# Patient Record
Sex: Male | Born: 1935 | Marital: Married | Smoking: Current some day smoker
Health system: Southern US, Community
[De-identification: ages and names within clinical notes are randomized; demographics above are authoritative.]

## PROBLEM LIST (undated history)

## (undated) DIAGNOSIS — I4891 Unspecified atrial fibrillation: Secondary | ICD-10-CM

## (undated) DIAGNOSIS — I10 Essential (primary) hypertension: Secondary | ICD-10-CM

## (undated) DIAGNOSIS — E538 Deficiency of other specified B group vitamins: Secondary | ICD-10-CM

---

## 2006-11-07 ENCOUNTER — Emergency Department: Payer: Self-pay | Admitting: Emergency Medicine

## 2012-08-04 ENCOUNTER — Ambulatory Visit: Payer: Self-pay | Admitting: Ophthalmology

## 2012-08-04 DIAGNOSIS — I10 Essential (primary) hypertension: Secondary | ICD-10-CM

## 2012-08-04 LAB — POTASSIUM: Potassium: 3.4 mmol/L — ABNORMAL LOW (ref 3.5–5.1)

## 2012-08-16 ENCOUNTER — Ambulatory Visit: Payer: Self-pay | Admitting: Ophthalmology

## 2014-08-18 NOTE — Op Note (Signed)
PATIENT NAME:  Jim Blake, [name] MR#:  [id number] DATE OF BIRTH:  Oct 12, 1935  DATE OF PROCEDURE:  08/16/2012  PREOPERATIVE DIAGNOSIS: Cataract, right eye.  POSTOPERATIVE DIAGNOSIS: Cataract, right eye.  PROCEDURE PERFORMED: Extracapsular cataract extraction using phacoemulsification with placement of Alcon [id number], 19.5 diopter posterior chamber lens, serial number [id number].   SURGEON: Maylon PeppersSteven A. Karelyn Brisby, MD  ANESTHESIA: 4% lidocaine and 0.75% Marcaine in a 50/50 mixture with 10 units per mL of Hylenex added, given as a peribulbar.   ANESTHESIOLOGIST: Dr. Dimple Caseyice.   COMPLICATIONS: None.   ESTIMATED BLOOD LOSS: Less than 1 mL.   DESCRIPTION OF PROCEDURE:  The patient was brought to the operating room and given a peribulbar block.  The patient was then prepped and draped in the usual fashion.  The vertical rectus muscles were imbricated using 5-0 silk sutures.  These sutures were then clamped to the sterile drapes as bridle sutures.  A limbal peritomy was performed extending two clock hours and hemostasis was obtained with cautery.  A partial thickness scleral groove was made at the surgical limbus and dissected anteriorly in a lamellar dissection using an Alcon crescent knife.  The anterior chamber was entered superonasally with a Superblade and through the lamellar dissection with a 2.6 mm keratome.  DisCoVisc was used to replace the aqueous and a continuous tear capsulorrhexis was carried out.  Hydrodissection and hydrodelineation were carried out with balanced salt and a 27 gauge canula.  The nucleus was rotated to confirm the effectiveness of the hydrodissection.  Phacoemulsification was carried out using a divide-and-conquer technique.  Total ultrasound time was 1 minute and 12 seconds with an average power of 22.5 percent.  CDE of 32.95.  Irrigation/aspiration was used to remove the residual cortex.  DisCoVisc was used to inflate the capsule and the internal incision was enlarged to 3 mm with  the crescent knife.  The intraocular lens was folded and inserted into the capsular bag using the AcrySert delivery system.  Irrigation/aspiration was used to remove the residual DisCoVisc.  Miostat was injected into the anterior chamber through the paracentesis track to inflate the anterior chamber and induce miosis.  10 mL of cefuroxime was placed through the paracentesis tract.  The wound was checked for leaks and none were found. The conjunctiva was closed with cautery and the bridle sutures were removed.  Two drops of 0.3% Vigamox were placed on the eye.   An eye shield was placed on the eye.  The patient was discharged to the recovery room in good condition.     ____________________________ Maylon PeppersSteven A. Shuayb Schepers, MD sad:es D: 08/16/2012 14:26:36 ET T: 08/16/2012 15:39:18 ET JOB#: [id number]  cc: Viviann SpareSteven A. Geraldy Akridge, MD, <Dictator> Erline LevineSTEVEN A Jan Walters MD ELECTRONICALLY SIGNED 08/23/2012 14:21

## 2016-03-24 DIAGNOSIS — M436 Torticollis: Secondary | ICD-10-CM | POA: Insufficient documentation

## 2019-08-12 DIAGNOSIS — N401 Enlarged prostate with lower urinary tract symptoms: Secondary | ICD-10-CM | POA: Insufficient documentation

## 2019-08-12 DIAGNOSIS — N138 Other obstructive and reflux uropathy: Secondary | ICD-10-CM | POA: Insufficient documentation

## 2020-10-06 ENCOUNTER — Other Ambulatory Visit: Payer: Self-pay

## 2020-10-06 ENCOUNTER — Emergency Department
Admission: EM | Admit: 2020-10-06 | Discharge: 2020-10-06 | Disposition: A | Discharge status: Home / Self Care | Payer: Medicare Other | Attending: Emergency Medicine | Admitting: Emergency Medicine

## 2020-10-06 ENCOUNTER — Encounter: Payer: Self-pay | Admitting: Emergency Medicine

## 2020-10-06 ENCOUNTER — Emergency Department: Payer: Medicare Other

## 2020-10-06 DIAGNOSIS — F17290 Nicotine dependence, other tobacco product, uncomplicated: Secondary | ICD-10-CM | POA: Diagnosis not present

## 2020-10-06 DIAGNOSIS — N3001 Acute cystitis with hematuria: Secondary | ICD-10-CM | POA: Insufficient documentation

## 2020-10-06 DIAGNOSIS — S0591XA Unspecified injury of right eye and orbit, initial encounter: Secondary | ICD-10-CM | POA: Diagnosis present

## 2020-10-06 DIAGNOSIS — F0390 Unspecified dementia without behavioral disturbance: Secondary | ICD-10-CM | POA: Insufficient documentation

## 2020-10-06 DIAGNOSIS — Z7901 Long term (current) use of anticoagulants: Secondary | ICD-10-CM | POA: Diagnosis not present

## 2020-10-06 DIAGNOSIS — W19XXXA Unspecified fall, initial encounter: Secondary | ICD-10-CM | POA: Diagnosis not present

## 2020-10-06 DIAGNOSIS — I4891 Unspecified atrial fibrillation: Secondary | ICD-10-CM | POA: Diagnosis not present

## 2020-10-06 DIAGNOSIS — S0511XA Contusion of eyeball and orbital tissues, right eye, initial encounter: Secondary | ICD-10-CM | POA: Diagnosis not present

## 2020-10-06 DIAGNOSIS — I10 Essential (primary) hypertension: Secondary | ICD-10-CM | POA: Insufficient documentation

## 2020-10-06 HISTORY — DX: Essential (primary) hypertension: I10

## 2020-10-06 LAB — BASIC METABOLIC PANEL
Anion gap: 8 (ref 5–15)
BUN: 30 mg/dL — ABNORMAL HIGH (ref 8–23)
CO2: 30 mmol/L (ref 22–32)
Calcium: 9.4 mg/dL (ref 8.9–10.3)
Chloride: 99 mmol/L (ref 98–111)
Creatinine, Ser: 1.44 mg/dL — ABNORMAL HIGH (ref 0.61–1.24)
GFR, Estimated: 48 mL/min — ABNORMAL LOW (ref >60)
Glucose, Bld: 142 mg/dL — ABNORMAL HIGH (ref 70–99)
Potassium: 3.1 mmol/L — ABNORMAL LOW (ref 3.5–5.1)
Sodium: 137 mmol/L (ref 135–145)

## 2020-10-06 LAB — URINALYSIS, COMPLETE (UACMP) WITH MICROSCOPIC
Bilirubin Urine: NEGATIVE
Glucose, UA: NEGATIVE mg/dL
Ketones, ur: NEGATIVE mg/dL
Nitrite: NEGATIVE
Protein, ur: 30 mg/dL — AB
Specific Gravity, Urine: 1.019 (ref 1.005–1.030)
Squamous Epithelial / HPF: NONE SEEN (ref 0–5)
WBC, UA: >50 WBC/hpf — ABNORMAL HIGH (ref 0–5)
pH: 5 (ref 5.0–8.0)

## 2020-10-06 LAB — CBC
HCT: 38.3 % — ABNORMAL LOW (ref 39.0–52.0)
Hemoglobin: 12.5 g/dL — ABNORMAL LOW (ref 13.0–17.0)
MCH: 25.8 pg — ABNORMAL LOW (ref 26.0–34.0)
MCHC: 32.6 g/dL (ref 30.0–36.0)
MCV: 79.1 fL — ABNORMAL LOW (ref 80.0–100.0)
Platelets: 210 10*3/uL (ref 150–400)
RBC: 4.84 MIL/uL (ref 4.22–5.81)
RDW: 15.5 % (ref 11.5–15.5)
WBC: 11.1 10*3/uL — ABNORMAL HIGH (ref 4.0–10.5)
nRBC: 0 % (ref 0.0–0.2)

## 2020-10-06 MED ORDER — CEPHALEXIN 500 MG PO CAPS: 500.0000 mg | ORAL_CAPSULE | Freq: Two times a day (BID) | ORAL | 0 refills | Status: AC

## 2020-10-06 NOTE — ED Notes (Signed)
Wife reports patient sleeps in a different bedroom. Wife reports noticing blood on the patient's head this morning, and patient reported that he fell sometime last night getting up to the bathroom. The wife reports tremors are baseline for patient. Wife reports some confusion this morning, worse than patient's baseline. Patient is alert, and follows commands, but does appear confused. Patient reports he thinks he is in a [employer]. Abrasion noted to forehead. Patient also noted to be febrile on arrival to room. Provider notified.

## 2020-10-06 NOTE — ED Notes (Signed)
Pt standing up at bedside attempting to provide urine sample. Visitor remains with pt. Will complete I&O cath if pt unable to provide sample.

## 2020-10-06 NOTE — Discharge Instructions (Signed)
You do have a urinary tract infection. Please take the antibiotic as prescribed.   See primary care in 2 weeks to make sure it is gone.

## 2020-10-06 NOTE — ED Notes (Signed)
Went to pt's room to prompt him to provide urine sample or may need to complete I&O cath. However, found pt in restroom and was unable to get him urinal quick enough to collect sample. Pt given urinal and told to provide sample next time he feels able. Pt back in stretcher.

## 2020-10-06 NOTE — ED Triage Notes (Signed)
Pt via POV from home. Pt wife states that she thinks pt fell during the night. Pt has a hx of dementia. Per wife, she came in room and saw an abrasion the L eyelid. Unknown when he fell, but had to be after 10:00pm last night. Unknow why he fell. Denies pain. Pt is alert to self.

## 2020-10-06 NOTE — ED Notes (Signed)
Patient able to provide urine sample without cath. Cari B, PA aware. Urine sent to lab.

## 2020-10-06 NOTE — ED Notes (Signed)
Pt up to restroom again attempting to provide urine sample. Unable to do so.

## 2020-10-06 NOTE — ED Provider Notes (Signed)
[facility] Emergency Department Provider Note ____________________________________________   Event Date/Time   First MD Initiated Contact with Patient 10/06/20 1335     (approximate)  I have reviewed the triage vital signs and the nursing notes.   HISTORY  Chief Complaint Fall  HPI Jim Blake is a 85 y.o. male with history of dementia, a-fib on Eliquis, hypertension presents to the emergency department for treatment and evaluation after unwitnessed fall sometime last night. Wife states that she got up this morning and headed down the hallway to wake him up to go for a haircut, but noticed his bathroom fan and light were on. When she went into his bedroom, he was in bed but she noticed a bruise and abrasion over the right eye. He is confused at baseline and does not recall the incident. He denies pain. Wife denies recent illness.          Past Medical History:  Diagnosis Date   Hypertension     There are no problems to display for this patient.   History reviewed. No pertinent surgical history.  Prior to Admission medications   Medication Sig Start Date End Date Taking? Authorizing Provider  cephALEXin (KEFLEX) 500 MG capsule Take 1 capsule (500 mg total) by mouth 2 (two) times daily for 7 days. 10/06/20 10/13/20 Yes Ivyanna Sibert, Kasandra Knudsen, FNP    Allergies Patient has no known allergies.  History reviewed. No pertinent family history.  Social History Social History   Tobacco Use   Smoking status: Some Days    Pack years: 0.00    Types: Cigars   Smokeless tobacco: Never  Substance Use Topics   Alcohol use: Never   Drug use: Never    Review of Systems  Level 5 Caveat: Confusion/dementia ____________________________________________   PHYSICAL EXAM:  VITAL SIGNS: ED Triage Vitals  Enc Vitals Group     BP 10/06/20 1128 (!) 114/57     Pulse Rate 10/06/20 1128 67     Resp 10/06/20 1128 18     Temp 10/06/20 1128 100.1 F (37.8 C)      Temp Source 10/06/20 1128 Oral     SpO2 10/06/20 1128 97 %     Weight 10/06/20 1128 130 lb (59 kg)     Height --      Head Circumference --      Peak Flow --      Pain Score 10/06/20 1136 0     Pain Loc --      Pain Edu? --      Excl. in GC? --     Constitutional: Alert and oriented. Overall well appearing and in no acute distress. Eyes: Conjunctivae are normal. PERRL. Head: Atraumatic. Nose: No congestion/rhinnorhea.No evidence of epistaxis. Mouth/Throat: Mucous membranes are moist.  Oropharynx non-erythematous. No trauma on tongue/buccal surfaces. Neck: No stridor.  No focal midline tenderness. Hematological/Lymphatic/Immunilogical: No cervical lymphadenopathy. Cardiovascular: Good peripheral circulation. Respiratory: Normal respiratory effort.  No retractions. Lungs CTAB. Gastrointestinal: Soft and nontender. No distention. No abdominal bruits.  Genitourinary:  Musculoskeletal: No lower extremity tenderness nor edema.  No joint effusions. Neurologic:  Normal speech and language. No gross focal neurologic deficits are appreciated. No gait instability. Skin:  Skin is warm, dry. No rash noted. Superficial abrasion over right eye without active bleeding. Psychiatric: Mood and affect are normal. Speech and behavior are normal.  ____________________________________________   LABS (all labs ordered are listed, but only abnormal results are displayed)  Labs Reviewed  URINE CULTURE -  Abnormal; Notable for the following components:      Result Value   Culture   (*)    Value: >=100,000 COLONIES/mL GRAM NEGATIVE RODS SUSCEPTIBILITIES TO FOLLOW Performed at [facility], [address], [place], [place] [address]    All other components within normal limits  BASIC METABOLIC PANEL - Abnormal; Notable for the following components:   Potassium 3.1 (*)    Glucose, Bld 142 (*)    BUN 30 (*)    Creatinine, Ser 1.44 (*)    GFR, Estimated 48 (*)    All other components within  normal limits  CBC - Abnormal; Notable for the following components:   WBC 11.1 (*)    Hemoglobin 12.5 (*)    HCT 38.3 (*)    MCV 79.1 (*)    MCH 25.8 (*)    All other components within normal limits  URINALYSIS, COMPLETE (UACMP) WITH MICROSCOPIC - Abnormal; Notable for the following components:   Color, Urine AMBER (*)    APPearance CLOUDY (*)    Hgb urine dipstick MODERATE (*)    Protein, ur 30 (*)    Leukocytes,Ua LARGE (*)    WBC, UA >50 (*)    Bacteria, UA MANY (*)    All other components within normal limits  CBG MONITORING, ED   ____________________________________________  EKG  ED ECG REPORT I, Colston Pyle, FNP-BC personally viewed and interpreted this ECG.   Date: 10/06/2020  EKG Time: 1134  Rate: 70  Rhythm: unchanged from previous tracings, atrial fibrillation  Axis: normal  Intervals:none  ST&T Change: no ST elevation  ____________________________________________  RADIOLOGY  ED MD interpretation:    CT head and cervical spine negative for acute concerns.  I, Kem Boroughs, personally viewed and evaluated these images (plain radiographs) as part of my medical decision making, as well as reviewing the written report by the radiologist.  Official radiology report(s): No results found.  ____________________________________________   PROCEDURES  Procedure(s) performed (including Critical Care):  Procedures  ____________________________________________   INITIAL IMPRESSION / ASSESSMENT AND PLAN     85 year old male presenting to the emergency department for evaluation after sustaining an unwitnessed injury to the right eyebrow sometime in the middle of the night.  Wife reports that he is at his baseline cognitively.  Plan will be to review protocol labs and imaging obtained while awaiting ER room assignment.  DIFFERENTIAL DIAGNOSIS  Electrolyte imbalance, urinary tract infection, unwitnessed fall, neck injury, head injury, ICH  ED  COURSE  Head CT is negative for acute findings.  Potassium is slightly low at 3.1.  Normal sodium and chloride.  Urinalysis is consistent with acute cystitis. He will be treated with keflex and sent home to follow up with PCP for repeat testing in 2 weeks or sooner if not improving or for new concerns.    ___________________________________________   FINAL CLINICAL IMPRESSION(S) / ED DIAGNOSES  Final diagnoses:  Acute cystitis with hematuria     ED Discharge Orders          Ordered    cephALEXin (KEFLEX) 500 MG capsule  2 times daily        10/06/20 1651             Aleks Nawrot was evaluated in Emergency Department on 10/08/2020 for the symptoms described in the history of present illness. He was evaluated in the context of the global COVID-19 pandemic, which necessitated consideration that the patient might be at risk for infection with the SARS-CoV-2  virus that causes COVID-19. Institutional protocols and algorithms that pertain to the evaluation of patients at risk for COVID-19 are in a state of rapid change based on information released by regulatory bodies including the CDC and federal and state organizations. These policies and algorithms were followed during the patient's care in the ED.   Note:  This document was prepared using Dragon voice recognition software and may include unintentional dictation errors.    Chinita Pester, FNP 10/08/20 1151    Minna Antis, MD 10/08/20 1611

## 2020-10-06 NOTE — ED Notes (Signed)
Pt verbalizes understanding of d/c instructions, medications and follow up 

## 2020-10-06 NOTE — ED Notes (Signed)
Patient attempted to provide sample, but reports he is unable to do so at this time. [place], PA notified.

## 2020-10-06 NOTE — ED Notes (Signed)
Patient is resting comfortably. Side rails in place. Wife at bedside.

## 2020-10-06 NOTE — ED Notes (Signed)
No in and out cath performed.

## 2020-10-09 LAB — URINE CULTURE: Culture: >=100000 — AB

## 2020-10-10 ENCOUNTER — Emergency Department: Payer: Medicare Other

## 2020-10-10 ENCOUNTER — Other Ambulatory Visit: Payer: Self-pay

## 2020-10-10 ENCOUNTER — Observation Stay
Admission: EM | Admit: 2020-10-10 | Discharge: 2020-10-10 | Disposition: A | Discharge status: Home / Self Care | Payer: Medicare Other | Attending: Internal Medicine | Admitting: Internal Medicine

## 2020-10-10 DIAGNOSIS — I951 Orthostatic hypotension: Secondary | ICD-10-CM | POA: Diagnosis present

## 2020-10-10 DIAGNOSIS — Z7901 Long term (current) use of anticoagulants: Secondary | ICD-10-CM | POA: Diagnosis not present

## 2020-10-10 DIAGNOSIS — F17210 Nicotine dependence, cigarettes, uncomplicated: Secondary | ICD-10-CM | POA: Diagnosis not present

## 2020-10-10 DIAGNOSIS — W01198A Fall on same level from slipping, tripping and stumbling with subsequent striking against other object, initial encounter: Secondary | ICD-10-CM | POA: Diagnosis not present

## 2020-10-10 DIAGNOSIS — R8281 Pyuria: Secondary | ICD-10-CM | POA: Diagnosis not present

## 2020-10-10 DIAGNOSIS — I4819 Other persistent atrial fibrillation: Secondary | ICD-10-CM | POA: Diagnosis not present

## 2020-10-10 DIAGNOSIS — S0083XA Contusion of other part of head, initial encounter: Principal | ICD-10-CM | POA: Insufficient documentation

## 2020-10-10 DIAGNOSIS — S0993XA Unspecified injury of face, initial encounter: Secondary | ICD-10-CM | POA: Diagnosis present

## 2020-10-10 DIAGNOSIS — E538 Deficiency of other specified B group vitamins: Secondary | ICD-10-CM | POA: Diagnosis present

## 2020-10-10 DIAGNOSIS — I482 Chronic atrial fibrillation, unspecified: Secondary | ICD-10-CM | POA: Diagnosis not present

## 2020-10-10 DIAGNOSIS — N3001 Acute cystitis with hematuria: Secondary | ICD-10-CM | POA: Insufficient documentation

## 2020-10-10 DIAGNOSIS — Z20822 Contact with and (suspected) exposure to covid-19: Secondary | ICD-10-CM | POA: Diagnosis not present

## 2020-10-10 DIAGNOSIS — I10 Essential (primary) hypertension: Secondary | ICD-10-CM | POA: Diagnosis present

## 2020-10-10 DIAGNOSIS — W19XXXA Unspecified fall, initial encounter: Secondary | ICD-10-CM

## 2020-10-10 DIAGNOSIS — Z79899 Other long term (current) drug therapy: Secondary | ICD-10-CM | POA: Diagnosis not present

## 2020-10-10 DIAGNOSIS — R4182 Altered mental status, unspecified: Secondary | ICD-10-CM | POA: Insufficient documentation

## 2020-10-10 DIAGNOSIS — I129 Hypertensive chronic kidney disease with stage 1 through stage 4 chronic kidney disease, or unspecified chronic kidney disease: Secondary | ICD-10-CM | POA: Diagnosis not present

## 2020-10-10 DIAGNOSIS — N189 Chronic kidney disease, unspecified: Secondary | ICD-10-CM | POA: Diagnosis not present

## 2020-10-10 DIAGNOSIS — R296 Repeated falls: Secondary | ICD-10-CM | POA: Diagnosis not present

## 2020-10-10 DIAGNOSIS — F419 Anxiety disorder, unspecified: Secondary | ICD-10-CM | POA: Diagnosis present

## 2020-10-10 DIAGNOSIS — I4811 Longstanding persistent atrial fibrillation: Secondary | ICD-10-CM

## 2020-10-10 HISTORY — DX: Deficiency of other specified B group vitamins: E53.8

## 2020-10-10 HISTORY — DX: Unspecified atrial fibrillation: I48.91

## 2020-10-10 LAB — URINALYSIS, COMPLETE (UACMP) WITH MICROSCOPIC
Bacteria, UA: NONE SEEN
Bilirubin Urine: NEGATIVE
Glucose, UA: NEGATIVE mg/dL
Ketones, ur: NEGATIVE mg/dL
Nitrite: NEGATIVE
Protein, ur: NEGATIVE mg/dL
Specific Gravity, Urine: 1.019 (ref 1.005–1.030)
Squamous Epithelial / HPF: NONE SEEN (ref 0–5)
pH: 5 (ref 5.0–8.0)

## 2020-10-10 LAB — CBC
HCT: 37.6 % — ABNORMAL LOW (ref 39.0–52.0)
Hemoglobin: 12 g/dL — ABNORMAL LOW (ref 13.0–17.0)
MCH: 25.6 pg — ABNORMAL LOW (ref 26.0–34.0)
MCHC: 31.9 g/dL (ref 30.0–36.0)
MCV: 80.3 fL (ref 80.0–100.0)
Platelets: 262 10*3/uL (ref 150–400)
RBC: 4.68 MIL/uL (ref 4.22–5.81)
RDW: 15.2 % (ref 11.5–15.5)
WBC: 7.4 10*3/uL (ref 4.0–10.5)
nRBC: 0 % (ref 0.0–0.2)

## 2020-10-10 LAB — COMPREHENSIVE METABOLIC PANEL
ALT: 20 U/L (ref 0–44)
AST: 23 U/L (ref 15–41)
Albumin: 3.6 g/dL (ref 3.5–5.0)
Alkaline Phosphatase: 73 U/L (ref 38–126)
Anion gap: 11 (ref 5–15)
BUN: 26 mg/dL — ABNORMAL HIGH (ref 8–23)
CO2: 30 mmol/L (ref 22–32)
Calcium: 9.5 mg/dL (ref 8.9–10.3)
Chloride: 98 mmol/L (ref 98–111)
Creatinine, Ser: 1.22 mg/dL (ref 0.61–1.24)
GFR, Estimated: 59 mL/min — ABNORMAL LOW (ref >60)
Glucose, Bld: 114 mg/dL — ABNORMAL HIGH (ref 70–99)
Potassium: 3.1 mmol/L — ABNORMAL LOW (ref 3.5–5.1)
Sodium: 139 mmol/L (ref 135–145)
Total Bilirubin: 1 mg/dL (ref 0.3–1.2)
Total Protein: 7.4 g/dL (ref 6.5–8.1)

## 2020-10-10 LAB — RESP PANEL BY RT-PCR (FLU A&B, COVID) ARPGX2
Influenza A by PCR: NEGATIVE
Influenza B by PCR: NEGATIVE
SARS Coronavirus 2 by RT PCR: NEGATIVE

## 2020-10-10 LAB — TSH: TSH: 1.022 u[IU]/mL (ref 0.350–4.500)

## 2020-10-10 LAB — TROPONIN I (HIGH SENSITIVITY)
Troponin I (High Sensitivity): 10 ng/L (ref <18)
Troponin I (High Sensitivity): 8 ng/L (ref <18)

## 2020-10-10 MED ORDER — RISPERIDONE 1 MG PO TABS: 1.0000 mg | ORAL_TABLET | Freq: Once | ORAL | Status: DC

## 2020-10-10 MED ORDER — ACETAMINOPHEN 325 MG PO TABS: 650.0000 mg | ORAL_TABLET | Freq: Four times a day (QID) | ORAL | Status: DC | PRN

## 2020-10-10 MED ORDER — LISINOPRIL 10 MG PO TABS: 20.0000 mg | ORAL_TABLET | Freq: Every day | ORAL | Status: DC

## 2020-10-10 MED ORDER — SODIUM CHLORIDE 0.9 % IV SOLN
1.0000 g | Freq: Once | INTRAVENOUS | Status: AC
  Administered 2020-10-10: 1 g via INTRAVENOUS
  Filled 2020-10-10: qty 10

## 2020-10-10 MED ORDER — LISINOPRIL-HYDROCHLOROTHIAZIDE 20-12.5 MG PO TABS: 1.0000 | ORAL_TABLET | Freq: Every day | ORAL | Status: DC

## 2020-10-10 MED ORDER — RISPERIDONE 1 MG PO TABS
0.5000 mg | ORAL_TABLET | Freq: Once | ORAL | Status: DC
  Filled 2020-10-10: qty 1

## 2020-10-10 MED ORDER — LACTATED RINGERS IV BOLUS
1000.0000 mL | Freq: Once | INTRAVENOUS | Status: AC
  Administered 2020-10-10: 1000 mL via INTRAVENOUS

## 2020-10-10 MED ORDER — APIXABAN 5 MG PO TABS: 5.0000 mg | ORAL_TABLET | Freq: Two times a day (BID) | ORAL | Status: DC

## 2020-10-10 MED ORDER — CEPHALEXIN 500 MG PO CAPS: 500.0000 mg | ORAL_CAPSULE | Freq: Two times a day (BID) | ORAL | Status: DC

## 2020-10-10 MED ORDER — ACETAMINOPHEN 325 MG PO TABS
650.0000 mg | ORAL_TABLET | Freq: Once | ORAL | Status: AC
  Administered 2020-10-10: 650 mg via ORAL
  Filled 2020-10-10: qty 2

## 2020-10-10 MED ORDER — ACETAMINOPHEN 650 MG RE SUPP: 650.0000 mg | Freq: Four times a day (QID) | RECTAL | Status: DC | PRN

## 2020-10-10 MED ORDER — NICOTINE 14 MG/24HR TD PT24
14.0000 mg | MEDICATED_PATCH | Freq: Once | TRANSDERMAL | Status: DC
  Administered 2020-10-10: 14 mg via TRANSDERMAL
  Filled 2020-10-10: qty 1

## 2020-10-10 MED ORDER — HYDROCHLOROTHIAZIDE 12.5 MG PO CAPS: 12.5000 mg | ORAL_CAPSULE | Freq: Every day | ORAL | Status: DC

## 2020-10-10 MED ORDER — SERTRALINE HCL 50 MG PO TABS: 100.0000 mg | ORAL_TABLET | Freq: Every day | ORAL | Status: DC

## 2020-10-10 MED ORDER — POTASSIUM CHLORIDE CRYS ER 20 MEQ PO TBCR
40.0000 meq | EXTENDED_RELEASE_TABLET | Freq: Once | ORAL | Status: AC
  Administered 2020-10-10: 40 meq via ORAL
  Filled 2020-10-10: qty 2

## 2020-10-10 MED ORDER — SODIUM CHLORIDE 0.45 % IV SOLN: INTRAVENOUS | Status: DC

## 2020-10-10 MED ORDER — LISINOPRIL-HYDROCHLOROTHIAZIDE 20-12.5 MG PO TABS: 1.0000 | ORAL_TABLET | Freq: Two times a day (BID) | ORAL | Status: DC

## 2020-10-10 NOTE — Discharge Instructions (Addendum)
Discharge Instructions   Family to stay with patient for 24 hrs Hydrate - 6-8 oz fluids every 4 hours while awake Monitor BP and HR every 3-4 hrs while awake Complete Abx for UTI Reduce dose of zestoretic/hct to once a day Keep appointment with PCP Monday,  June 20th   Red flags SBP < 100 or > 170 HR > 90 Inability to drink fluids Change in level of consciousness

## 2020-10-10 NOTE — ED Notes (Signed)
High fall precautions in place. Family present at bedside with patient.

## 2020-10-10 NOTE — Evaluation (Signed)
Occupational Therapy Evaluation Patient Details Name: Jim Blake MRN: [id number] DOB: May 20, 1935 Today's Date: 10/10/2020    History of Present Illness 85 y.o. male has a past medical history of dementia, A. fib on Eliquis, anxiety, CKD, HTN and recent diagnosis of UTI on 6/11 after a fall.  When home and pt had another fall, now with fall under L eye.   Clinical Impression   Patient presenting with decreased I in self care, balance, functional mobility/transfers, endurance, and safety awareness.Patient's family present in the room (son and daughter) who report pt lives with wife and has baseline dementia PTA. He is normally able to care for himself at home per family report. This session pt does not answer questions and is overall quiet. He is impulsive with mobility and pulling IV. Pt wanting to go home and family wanting to take him home. Patient currently functioning at supervision - min guard. Family worried about assisting pt at home. Hands on training with use of RW and HHA with gait belt. Family felt more secure with gait belt and HHA. OT recommending 24/7 supervision for safety and suggested 2 way video baby monitor to help them assist him at home.Patient will benefit from acute OT to increase overall independence in the areas of ADLs, functional mobility, and safety awareness in order to safely discharge home with family.     Follow Up Recommendations  Home health OT;Supervision/Assistance - 24 hour    Equipment Recommendations  None recommended by OT       Precautions / Restrictions Precautions Precautions: Fall Precaution Comments: Pt has apparently not had any falls in the last year prior to this week. Restrictions Weight Bearing Restrictions: No      Mobility Bed Mobility               General bed mobility comments: seated EOB on arrival, returned to EOB post session    Transfers Overall transfer level: Needs assistance Equipment used: Rolling walker (2 wheeled);1  person hand held assist Transfers: Sit to/from [employer] Sit to Stand: Min guard Stand pivot transfers: Min guard       General transfer comment: min guard for safety    Balance Overall balance assessment: Needs assistance Sitting-balance support: Feet supported Sitting balance-Leahy Scale: Good Sitting balance - Comments: no LOB   Standing balance support: During functional activity Standing balance-Leahy Scale: Fair                             ADL either performed or assessed with clinical judgement   ADL Overall ADL's : Needs assistance/impaired                                       General ADL Comments: Pt needing min A for safety with balance and cuing for safety awareness, technique, and initiation.     Vision Patient Visual Report: No change from baseline              Pertinent Vitals/Pain Pain Assessment: No/denies pain     Hand Dominance Right   Extremity/Trunk Assessment Upper Extremity Assessment Upper Extremity Assessment: Overall WFL for tasks assessed   Lower Extremity Assessment Lower Extremity Assessment: Overall WFL for tasks assessed       Communication Communication Communication: No difficulties   Cognition Arousal/Alertness: Awake/alert Behavior During Therapy: Restless Overall Cognitive Status: Impaired/Different from baseline  General Comments: baseline dementia. Family reports increased confusion and impulsivity ( trying to get up ). Pt very quiet during session and was unable to even tell me his name.              Home Living Family/patient expects to be discharged to:: Private residence Living Arrangements: Spouse/significant other Available Help at Discharge: Family;Available PRN/intermittently Type of Home: House Home Access: Ramped entrance     Home Layout: One level     Bathroom Shower/Tub: Walk-in shower         Home  Equipment: Environmental consultant - 2 wheels          Prior Functioning/Environment Level of Independence: Needs assistance  Gait / Transfers Assistance Needed: apparently until this week he could do most of what he needed with verbal cuing but did not need AD or phyiscal assist ADL's / Homemaking Assistance Needed: wife and local family assist with meals, errands, etc            OT Problem List: Decreased strength;Decreased cognition;Decreased activity tolerance;Impaired balance (sitting and/or standing);Decreased safety awareness      OT Treatment/Interventions: Self-care/ADL training;Manual therapy;Therapeutic exercise;Patient/family education;Modalities;Energy conservation;DME and/or AE instruction;Therapeutic activities;Balance training    OT Goals(Current goals can be found in the care plan section) Acute Rehab OT Goals Patient Stated Goal: family hoping to take pt back home OT Goal Formulation: With patient/family Time For Goal Achievement: 10/24/20 Potential to Achieve Goals: Good ADL Goals Pt Will Perform Grooming: with supervision;standing Pt Will Perform Lower Body Dressing: with supervision;sit to/from stand Pt Will Transfer to Toilet: with supervision;ambulating Pt Will Perform Toileting - Clothing Manipulation and hygiene: with supervision;sit to/from stand Pt Will Perform Tub/Shower Transfer: with supervision;ambulating  OT Frequency: Min 2X/week   Barriers to D/C:    none known at this time          AM-PAC OT "6 Clicks" Daily Activity     Outcome Measure Help from another person eating meals?: None Help from another person taking care of personal grooming?: A Little Help from another person toileting, which includes using toliet, bedpan, or urinal?: A Little Help from another person bathing (including washing, rinsing, drying)?: A Little Help from another person to put on and taking off regular upper body clothing?: A Little Help from another person to put on and taking  off regular lower body clothing?: A Little 6 Click Score: 19   End of Session Equipment Utilized During Treatment: Rolling walker Nurse Communication: Mobility status  Activity Tolerance: Patient tolerated treatment well Patient left: in bed;with call bell/phone within reach;with family/visitor present  OT Visit Diagnosis: Unsteadiness on feet (R26.81);Muscle weakness (generalized) (M62.81);History of falling (Z91.81)                Time: 0254-2706 OT Time Calculation (min): 27 min Charges:  OT General Charges $OT Visit: 1 Visit OT Evaluation $OT Eval Low Complexity: 1 Low OT Treatments $Self Care/Home Management : 8-22 mins $Therapeutic Activity: 8-22 mins Jackquline Denmark, MS, OTR/L , CBIS ascom [phone]  10/10/20, 4:03 PM

## 2020-10-10 NOTE — H&P (Signed)
History and Physical    Jim Blake CHY:[id number] DOB: 1935/07/11 DOA: 10/10/2020  PCP: Nira Retort (Confirm with patient/family/NH records and if not entered, this has to be entered at [facility] point of entry) Patient coming from: home  I have personally briefly reviewed patient's old medical records in [facility]  Chief Complaint: fall with LOC  HPI: Jim Blake is a 85 y.o. male with medical history significant of a fib, B12 deficiency,HTN who was seen in [facility] ED June 11th for fall. He was found to have UTI and was started on abx. Per family he had another fall, question of LOC, and hit his face with resultant hematoma. He was seen by his PCP who referred him to [facility] ED for further evaluation.    ED Course: T 98.4  125/65  HR 74,  RR 15. Per EDP report he did orthostatic hypotension and was given I liter IVF bolus. U/A revealed sterile pyuria. CT-Cspine negative; CT head w/o acute injury or bleed, atrophy noted, CXR w/o acute changes. In ED PT evaluation revealed patient to have gait abnormality and recommend 24 hr supervision and use of walker.  TRH called to admit for further evaluation and correction of orthostatic hypotension.  Review of Systems: As per HPI otherwise 10 point review of systems negative.    Past Medical History:  Diagnosis Date   Atrial fibrillation (HCC)    persistent   on NOAC   Hypertension    Vitamin B 12 deficiency     History reviewed. No pertinent surgical history.  Soc Hx - married and lives with his wife. Family lives very close by. Two granddaughters are nurses.   reports that he has been smoking cigars. He has never used smokeless tobacco. He reports that he does not drink alcohol and does not use drugs.  No Known Allergies  No family history on file. Unacceptable: Noncontributory, unremarkable, or negative. Acceptable: Family history reviewed and not pertinent (If you reviewed it)  Prior to Admission medications   Medication Sig Start  Date End Date Taking? Authorizing Provider  apixaban (ELIQUIS) 5 MG TABS tablet Take 5 mg by mouth 2 (two) times daily.   Yes [provider]  cephALEXin (KEFLEX) 500 MG capsule Take 1 capsule (500 mg total) by mouth 2 (two) times daily for 7 days. 10/06/20 10/13/20 Yes Triplett, Cari B, FNP  lisinopril-hydrochlorothiazide (ZESTORETIC) 20-12.5 MG tablet Take 1 tablet by mouth 2 (two) times daily.   Yes [provider]  Multiple Vitamins-Minerals (MULTIVITAMIN WITH MINERALS) tablet Take 1 tablet by mouth daily.   Yes [provider]  sertraline (ZOLOFT) 100 MG tablet Take 100 mg by mouth daily.   Yes [provider]    Physical Exam: Vitals:   10/10/20 1155 10/10/20 1200 10/10/20 1205 10/10/20 1230  BP: (S) (!) 163/94 (S) 133/80 (S) 125/65   Pulse: (S) 62 (!) 52 (S) 67 74  Resp: 18 18  15   Temp:      TempSrc:      SpO2: 97% 100%  98%  Weight:      Height:         Vitals:   10/10/20 1155 10/10/20 1200 10/10/20 1205 10/10/20 1230  BP: (S) (!) 163/94 (S) 133/80 (S) 125/65   Pulse: (S) 62 (!) 52 (S) 67 74  Resp: 18 18  15   Temp:      TempSrc:      SpO2: 97% 100%  98%  Weight:      Height:  General: gruff appearing elderly man in no distress Eyes: PERRL, lids and conjunctivae normal ENMT: Mucous membranes are moist. Posterior pharynx clear of any exudate or lesions.Poor dentition Neck: normal, supple, no masses, no thyromegaly Respiratory: clear to auscultation bilaterally, no wheezing, no crackles. Normal respiratory effort. No accessory muscle use.  Cardiovascular: IRIR no murmurs / rubs / gallops. .  Abdomen: no tenderness, no masses palpated. No hepatosplenomegaly. Bowel sounds positive.  Musculoskeletal: no clubbing / cyanosis. No joint deformity upper and lower extremities. Good ROM, no contractures. Normal muscle tone.  Skin: no rashes, lesions, ulcers. No induration Neurologic: CN 2-12 grossly intact. No tremor, no focal weakness -   Strength 5/5 in all 4.  Psychiatric: Poor insight. Awake, Alert and oriented to person and place. Mildly agitated.    Labs on Admission: I have personally reviewed following labs and imaging studies  CBC: Recent Labs  Lab 10/06/20 1142 10/10/20 1025  WBC 11.1* 7.4  HGB 12.5* 12.0*  HCT 38.3* 37.6*  MCV 79.1* 80.3  PLT 210 262   Basic Metabolic Panel: Recent Labs  Lab 10/06/20 1142 10/10/20 1025  NA 137 139  K 3.1* 3.1*  CL 99 98  CO2 30 30  GLUCOSE 142* 114*  BUN 30* 26*  CREATININE 1.44* 1.22  CALCIUM 9.4 9.5   GFR: Estimated Creatinine Clearance: 50.4 mL/min (by C-G formula based on SCr of 1.22 mg/dL). Liver Function Tests: Recent Labs  Lab 10/10/20 1025  AST 23  ALT 20  ALKPHOS 73  BILITOT 1.0  PROT 7.4  ALBUMIN 3.6   No results for input(s): LIPASE, AMYLASE in the last 168 hours. No results for input(s): AMMONIA in the last 168 hours. Coagulation Profile: No results for input(s): INR, PROTIME in the last 168 hours. Cardiac Enzymes: No results for input(s): CKTOTAL, CKMB, CKMBINDEX, TROPONINI in the last 168 hours. BNP (last 3 results) No results for input(s): PROBNP in the last 8760 hours. HbA1C: No results for input(s): HGBA1C in the last 72 hours. CBG: No results for input(s): GLUCAP in the last 168 hours. Lipid Profile: No results for input(s): CHOL, HDL, LDLCALC, TRIG, CHOLHDL, LDLDIRECT in the last 72 hours. Thyroid Function Tests: Recent Labs    10/10/20 1025  TSH 1.022   Anemia Panel: No results for input(s): VITAMINB12, FOLATE, FERRITIN, TIBC, IRON, RETICCTPCT in the last 72 hours. Urine analysis:    Component Value Date/Time   COLORURINE YELLOW (A) 10/10/2020 1152   APPEARANCEUR CLEAR (A) 10/10/2020 1152   LABSPEC 1.019 10/10/2020 1152   PHURINE 5.0 10/10/2020 1152   GLUCOSEU NEGATIVE 10/10/2020 1152   HGBUR MODERATE (A) 10/10/2020 1152   BILIRUBINUR NEGATIVE 10/10/2020 1152   KETONESUR NEGATIVE 10/10/2020 1152   PROTEINUR  NEGATIVE 10/10/2020 1152   NITRITE NEGATIVE 10/10/2020 1152   LEUKOCYTESUR SMALL (A) 10/10/2020 1152    Radiological Exams on Admission: DG Chest 2 View  Result Date: 10/10/2020 CLINICAL DATA:  Pain following fall EXAM: CHEST - 2 VIEW COMPARISON:  None. FINDINGS: There is slight left base atelectasis. Lungs elsewhere are clear. Heart is slightly enlarged with pulmonary vascularity normal. No adenopathy. There is degenerative change in the thoracic spine. 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 No pneumothorax. IMPRESSION: Slight left base atelectasis. No edema or airspace opacity. Heart slightly enlarged. No pneumothorax. Electronically Signed   By: Bretta Bang III M.D.   On: 10/10/2020 13:00   CT Head Wo Contrast  Result Date: 10/10/2020 CLINICAL DATA:  Fall, head injury EXAM: CT HEAD  WITHOUT CONTRAST TECHNIQUE: Contiguous axial images were obtained from the base of the skull through the vertex without intravenous contrast. COMPARISON:  CT head 10/06/2020 FINDINGS: Brain: Generalized atrophy. Mild ventricular enlargement unchanged and consistent with atrophy. Mild chronic microvascular ischemic change in the white matter and right basal ganglia. Negative for acute infarct, hemorrhage, mass Vascular: Negative for hyperdense vessel. Skull: Negative Sinuses/Orbits: Mild mucosal edema paranasal sinuses. Right cataract extraction Other: None IMPRESSION: No acute abnormality Atrophy and mild chronic microvascular ischemic change. Electronically Signed   By: Marlan Palau M.D.   On: 10/10/2020 10:16   CT Cervical Spine Wo Contrast  Result Date: 10/10/2020 CLINICAL DATA:  Neck trauma.  Additional provided: Fall. EXAM: CT CERVICAL SPINE WITHOUT CONTRAST TECHNIQUE: Multidetector CT imaging of the cervical spine was performed without intravenous contrast. Multiplanar CT image reconstructions were also generated. COMPARISON:  CT cervical spine 10/06/2020. FINDINGS: Alignment:  Trace C7-T1 and T1-T2 grade 1 anterolisthesis, unchanged. Skull base and vertebrae: The basion-dental and atlanto-dental intervals are maintained.No evidence of acute fracture to the cervical spine. Soft tissues and spinal canal: No prevertebral fluid or swelling. No visible canal hematoma. Disc levels: Cervical spondylosis with multilevel disc space narrowing, disc bulges, endplate spurring, posterior disc osteophytes, uncovertebral hypertrophy and facet arthrosis. Disc space narrowing is greatest at C5-C6, C6-C7, T1-T2 and T2-T3 (advanced at these levels). Multilevel spinal canal stenosis. Most notably, there is suspected moderate spinal canal stenosis at C4-C5, C5-C6 and C6-C7. Multilevel bony neural foraminal narrowing. Upper chest: No consolidation within the imaged lung apices. No visible pneumothorax. IMPRESSION: No evidence of acute fracture to the cervical spine. Trace C7-T1 and T1-T2 grade 1 anterolisthesis, unchanged. Cervical spondylosis, as described. Electronically Signed   By: Jackey Loge DO   On: 10/10/2020 11:05    EKG: Independently reviewed. A fib at 64, no acute changes, no change from 10/06/20  Assessment/Plan Active Problems:   Orthostatic hypotension   Pyuria, sterile   Essential hypertension   Atrial fibrillation, chronic (HCC)   Anxiety   B12 deficiency   Multiple falls    UTI - patient has been taking keflex. U/A micro w/o bacteria, 21-50 WBC/hpf (was > 50/hpf), no fever, normal CBC. Plan Complete abx  2. Orthostatic hypotension - family states patient has not been drinking copious fluids. Has had some light-headed feelings. He was given IVF bolus in ED Plan Oral hydration at home: 5-8 oz fluids every 4 hours while awake  3. HTN - patient recently had zestoretic/HCT  20/12.5 increased to twice a day. May be a factor in falls due to episodes of hypotension. Plan Take zestorectic once a day  Monitor BP and HR at home every 3-4 hours while awake  4. A fib - EKG  reveals rate controlled A. Fib and no acute changes Plan No change in medications  5. Multiple falls - most likely multi-factorial: dehydration, meds. Normal CT head and C-spine. Non-focal neuro exam. PT eval done in ED.  Plan Supervision at home by family  Use of walker  F/u assessment by PCP in the next several days.   6. B12 deficiency - existing problem. Did not check sensation feet which  could be a contributing factor.  7. Anxiety - patient with dementia and anxiety. Did get more anxious in ED. Family felt patient would be do better at home. After full review of all labs, imaging studies and exam patient stable to return home. FAmily provided written instructions and Red Flag warning. F/u appointment with PCP scheduled for Monday,  June 20th  DVT prophylaxis: eliquis  Code Status: full code  Family Communication: daughter and son-in-law present during discussion, exam and disposition. Agree to plan to return home and understand that there are always risks but that there is scant evidence of any acute issues.   Disposition Plan: Return home  Consults called: none  Admission status: obs    Illene RegulusMichael Riely Baskett MD [facility] Pager [phone]  If 7PM-7AM, please contact night-coverage [email or web address] Password [email or web address]  10/10/2020, 3:33 PM

## 2020-10-10 NOTE — ED Triage Notes (Signed)
Per pt son, pt fell and was treated for UTI last Saturday,. States yesterday he "just went down" thinks the pt actually passed out and struck his head, pt has a hematoma below the left eye, states since he has not been acting normal or  at baseline, states he has some memory issues. Pt denies any pain at this time and is alert on arrival

## 2020-10-10 NOTE — ED Notes (Signed)
Pt's family expresses strong desire to take patient home, they state they are always available to be at home with him. They do not think he will do well in the hospital environment. Discussed this with Dr Michiel Sites and messaged attending MD Dr Debby Bud to discuss this further with the patient's family.

## 2020-10-10 NOTE — Discharge Summary (Signed)
Physician Discharge Summary  Jim Blake ZOX:[id number] DOB: 09-24-1935 DOA: 10/10/2020  PCP: Nira Retort  Admit date: 10/10/2020 Discharge date: 10/10/2020  Admitted From: home Disposition:  Home Recommendations for Outpatient Follow-up:  Follow up with PCP Monday, JUne 20th - appointment scheduled Please obtain BMP/CBC at follow up Please follow up on the following pending results:  Home Health:no  Equipment/Devices:obtain walker (  Discharge Condition:stable  CODE STATUS:full code  Diet recommendation: Regular  Brief/Interim Summary:  Jim Blake is a 85 y.o. male with medical history significant of a fib, B12 deficiency,HTN who was seen in [facility] ED June 11th for fall. He was found to have UTI and was started on abx. Per family he had another fall, question of LOC, and hit his face with resultant hematoma. He was seen by his PCP who referred him to [facility] ED for further evaluation.     ED Course: T 98.4  125/65  HR 74,  RR 15. Per EDP report he did orthostatic hypotension and was given I liter IVF bolus. U/A revealed sterile pyuria. CT-Cspine negative; CT head w/o acute injury or bleed, atrophy noted, CXR w/o acute changes. In ED PT evaluation revealed patient to have gait abnormality and recommend 24 hr supervision and use of walker.  TRH called to admit for further evaluation and correction of orthostatic hypotension.  Patient preferred to be discharged to home and declined obs admission  Discharge Diagnoses:  Active Problems:   Orthostatic hypotension   Pyuria, sterile   Essential hypertension   Atrial fibrillation, chronic (HCC)   Anxiety   B12 deficiency   Multiple falls  UTI - patient has been taking keflex. U/A micro w/o bacteria, 21-50 WBC/hpf (was > 50/hpf), no fever, normal CBC. Plan     Complete abx   2. Orthostatic hypotension - family states patient has not been drinking copious fluids. Has had some light-headed feelings. He was given IVF bolus in ED Plan      Oral hydration at home: 5-8 oz fluids every 4 hours while awake   3. HTN - patient recently had zestoretic/HCT  20/12.5 increased to twice a day. May be a factor in falls due to episodes of hypotension. Plan     Take zestorectic once a day             Monitor BP and HR at home every 3-4 hours while awake   4. A fib - EKG reveals rate controlled A. Fib and no acute changes Plan     No change in medications   5. Multiple falls - most likely multi-factorial: dehydration, meds. Normal CT head and C-spine. Non-focal neuro exam. PT eval done in ED. Plan     Supervision at home by family             Use of walker             F/u assessment by PCP in the next several days.   6. B12 deficiency - existing problem. Did not check sensation feet which  could be a contributing factor.   7. Anxiety - patient with dementia and anxiety. Did get more anxious in ED. Family felt patient would be do better at home. After full review of all labs, imaging studies and exam patient stable to return home. FAmily provided written instructions and Red Flag warning. F/u appointment with PCP scheduled for Monday, June 20th   Discharge Instructions  Family to stay with patient for 24 hrs Hydrate - 6-8 oz fluids every  4 hours while awake Monitor BP and HR every 3-4 hrs while awake Complete Abx for UTI Reduce dose of zestoretic/hct to once a day Keep appointment with PCP Monday,  June 20th  Red flags SBP < 100 or > 170 HR > 90 Inability to drink fluids Change in level of consciousness     No Known Allergies  Consultations: none   Procedures/Studies: DG Chest 2 View  Result Date: 10/10/2020 CLINICAL DATA:  Pain following fall EXAM: CHEST - 2 VIEW COMPARISON:  None. FINDINGS: There is slight left base atelectasis. Lungs elsewhere are clear. Heart is slightly enlarged with pulmonary vascularity normal. No adenopathy. There is degenerative change in the thoracic spine. 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 No pneumothorax. IMPRESSION: Slight left base atelectasis. No edema or airspace opacity. Heart slightly enlarged. No pneumothorax. Electronically Signed   By: Bretta Bang III M.D.   On: 10/10/2020 13:00   CT Head Wo Contrast  Result Date: 10/10/2020 CLINICAL DATA:  Fall, head injury EXAM: CT HEAD WITHOUT CONTRAST TECHNIQUE: Contiguous axial images were obtained from the base of the skull through the vertex without intravenous contrast. COMPARISON:  CT head 10/06/2020 FINDINGS: Brain: Generalized atrophy. Mild ventricular enlargement unchanged and consistent with atrophy. Mild chronic microvascular ischemic change in the white matter and right basal ganglia. Negative for acute infarct, hemorrhage, mass Vascular: Negative for hyperdense vessel. Skull: Negative Sinuses/Orbits: Mild mucosal edema paranasal sinuses. Right cataract extraction Other: None IMPRESSION: No acute abnormality Atrophy and mild chronic microvascular ischemic change. Electronically Signed   By: Marlan Palau M.D.   On: 10/10/2020 10:16   CT HEAD WO CONTRAST  Result Date: 10/06/2020 CLINICAL DATA:  Status post fall EXAM: CT HEAD WITHOUT CONTRAST TECHNIQUE: Contiguous axial images were obtained from the base of the skull through the vertex without intravenous contrast. COMPARISON:  None. FINDINGS: Brain: No evidence of acute infarction, hemorrhage, hydrocephalus, extra-axial collection or mass lesion/mass effect. 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 Mild central atrophy with ex vacuo ventriculomegaly. Periventricular and subcortical white matter hypoattenuation consistent with chronic microvascular ischemic white matter disease. Vascular: No hyperdense vessel or unexpected calcification. Skull: Normal. Negative for fracture or focal lesion. Sinuses/Orbits: No acute finding. Other: None. IMPRESSION: 1. No acute intracranial abnormality. 2. Central atrophy with ex  vacuo ventriculomegaly. 3. 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 Electronically Signed   By: Malachy Moan M.D.   On: 10/06/2020 12:18   CT Cervical Spine Wo Contrast  Result Date: 10/10/2020 CLINICAL DATA:  Neck trauma.  Additional provided: Fall. EXAM: CT CERVICAL SPINE WITHOUT CONTRAST TECHNIQUE: Multidetector CT imaging of the cervical spine was performed without intravenous contrast. Multiplanar CT image reconstructions were also generated. COMPARISON:  CT cervical spine 10/06/2020. FINDINGS: Alignment: Trace C7-T1 and T1-T2 grade 1 anterolisthesis, unchanged. Skull base and vertebrae: The basion-dental and atlanto-dental intervals are maintained.No evidence of acute fracture to the cervical spine. Soft tissues and spinal canal: No prevertebral fluid or swelling. No visible canal hematoma. Disc levels: Cervical spondylosis with multilevel disc space narrowing, disc bulges, endplate spurring, posterior disc osteophytes, uncovertebral hypertrophy and facet arthrosis. Disc space narrowing is greatest at C5-C6, C6-C7, T1-T2 and T2-T3 (advanced at these levels). Multilevel spinal canal stenosis. Most notably, there is suspected moderate spinal canal stenosis at C4-C5, C5-C6 and C6-C7. Multilevel bony neural foraminal narrowing. Upper chest: No consolidation within the imaged lung apices. No visible pneumothorax. IMPRESSION: No evidence of acute fracture to the cervical spine.  Trace C7-T1 and T1-T2 grade 1 anterolisthesis, unchanged. Cervical spondylosis, as described. Electronically Signed   By: Jackey LogeKyle  Golden DO   On: 10/10/2020 11:05   CT Cervical Spine Wo Contrast  Result Date: 10/06/2020 CLINICAL DATA:  Status post fall EXAM: CT CERVICAL SPINE WITHOUT CONTRAST TECHNIQUE: Multidetector CT imaging of the cervical spine was performed without intravenous contrast. Multiplanar CT image reconstructions were also generated. COMPARISON:  None. FINDINGS: Alignment: Normal. Skull base and  vertebrae: No acute fracture. No primary bone lesion or focal pathologic process. Soft tissues and spinal canal: No prevertebral fluid or swelling. No visible canal hematoma. Disc levels: Multilevel cervical spondylosis. Most advanced levels of degenerative change at C5-C6 and C6-C7. Motion related artifact limits evaluation of the C6-C7 and C7-T1 levels. Multilevel bilateral facet arthropathy. No lytic or blastic osseous lesion. Upper chest: Negative. Other: None. IMPRESSION: 1. No acute fracture or malalignment. 2. Multilevel cervical spondylosis and bilateral facet arthropathy. Electronically Signed   By: Malachy MoanHeath  McCullough M.D.   On: 10/06/2020 12:40   (Echo, Carotid, EGD, Colonoscopy, ERCP)    Subjective:   Discharge Exam: Vitals:   10/10/20 1230 10/10/20 1546  BP:  (!) 128/92  Pulse: 74 60  Resp: 15 17  Temp:    SpO2: 98% 97%   Vitals:   10/10/20 1200 10/10/20 1205 10/10/20 1230 10/10/20 1546  BP: (S) 133/80 (S) 125/65  (!) 128/92  Pulse: (!) 52 (S) 67 74 60  Resp: 18  15 17   Temp:      TempSrc:      SpO2: 100%  98% 97%  Weight:      Height:        General: Pt is alert, awake, not in acute distress Cardiovascular: RRR, S1/S2 +, no rubs, no gallops Respiratory: CTA bilaterally, no wheezing, no rhonchi Abdominal: Soft, NT, ND, bowel sounds + Extremities: no edema, no cyanosis    The results of significant diagnostics from this hospitalization (including imaging, microbiology, ancillary and laboratory) are listed below for reference.     Microbiology: Recent Results (from the past 240 hour(s))  Urine culture     Status: Abnormal   Collection Time: 10/06/20  3:48 PM   Specimen: Urine, Clean Catch  Result Value Ref Range Status   Specimen Description   Final    URINE, CLEAN CATCH Performed at [facility], [address] [place], [place] [address]    Special Requests   Final    NONE Performed at [facility], [address] [place], [place]  [id number]    Culture >=100,000 COLONIES/mL KLEBSIELLA PNEUMONIAE (A)  Final   Report Status 10/09/2020 FINAL  Final   Organism ID, Bacteria KLEBSIELLA PNEUMONIAE (A)  Final      Susceptibility   Klebsiella pneumoniae - MIC*    AMPICILLIN RESISTANT Resistant     CEFAZOLIN <=4 SENSITIVE Sensitive     CEFEPIME <=0.12 SENSITIVE Sensitive     CEFTRIAXONE <=0.25 SENSITIVE Sensitive     CIPROFLOXACIN <=0.25 SENSITIVE Sensitive     GENTAMICIN <=1 SENSITIVE Sensitive     IMIPENEM <=0.25 SENSITIVE Sensitive     NITROFURANTOIN 32 SENSITIVE Sensitive     TRIMETH/SULFA >=320 RESISTANT Resistant     AMPICILLIN/SULBACTAM <=2 SENSITIVE Sensitive     PIP/TAZO <=4 SENSITIVE Sensitive     * >=100,000 COLONIES/mL KLEBSIELLA PNEUMONIAE  Resp Panel by RT-PCR (Flu A&B, Covid) Nasopharyngeal Swab     Status: None   Collection Time: 10/10/20  2:06 PM   Specimen: Nasopharyngeal  Swab; Nasopharyngeal(NP) swabs in vial transport medium  Result Value Ref Range Status   SARS Coronavirus 2 by RT PCR NEGATIVE NEGATIVE Final    Comment: (NOTE) SARS-CoV-2 target nucleic acids are NOT DETECTED.  The SARS-CoV-2 RNA is generally detectable in upper respiratory specimens during the acute phase of infection. The lowest concentration of SARS-CoV-2 viral copies this assay can detect is 138 copies/mL. A negative result does not preclude SARS-Cov-2 infection and should not be used as the sole basis for treatment or other patient management decisions. A negative result may occur with  improper specimen collection/handling, submission of specimen other than nasopharyngeal swab, presence of viral mutation(s) within the areas targeted by this assay, and inadequate number of viral copies(<138 copies/mL). A negative result must be combined with clinical observations, patient history, and epidemiological information. The expected result is Negative.  Fact Sheet for Patients:  BloggerCourse.com  Fact  Sheet for Healthcare Providers:  SeriousBroker.it  This test is no t yet approved or cleared by the [place] FDA and  has been authorized for detection and/or diagnosis of SARS-CoV-2 by FDA under an Emergency Use Authorization (EUA). This EUA will remain  in effect (meaning this test can be used) for the duration of the COVID-19 declaration under Section 564(b)(1) of the Act, 21 U.S.C.section 360bbb-3(b)(1), unless the authorization is terminated  or revoked sooner.       Influenza A by PCR NEGATIVE NEGATIVE Final   Influenza B by PCR NEGATIVE NEGATIVE Final    Comment: (NOTE) The Xpert Xpress SARS-CoV-2/FLU/RSV plus assay is intended as an aid in the diagnosis of influenza from Nasopharyngeal swab specimens and should not be used as a sole basis for treatment. Nasal washings and aspirates are unacceptable for Xpert Xpress SARS-CoV-2/FLU/RSV testing.  Fact Sheet for Patients: BloggerCourse.com  Fact Sheet for Healthcare Providers: SeriousBroker.it  This test is not yet approved or cleared by the [place] FDA and has been authorized for detection and/or diagnosis of SARS-CoV-2 by FDA under an Emergency Use Authorization (EUA). This EUA will remain in effect (meaning this test can be used) for the duration of the COVID-19 declaration under Section 564(b)(1) of the Act, 21 U.S.C. section 360bbb-3(b)(1), unless the authorization is terminated or revoked.  Performed at [facility], [address] [place], [place] [address]      Labs: BNP (last 3 results) No results for input(s): BNP in the last 8760 hours. Basic Metabolic Panel: Recent Labs  Lab 10/06/20 1142 10/10/20 1025  NA 137 139  K 3.1* 3.1*  CL 99 98  CO2 30 30  GLUCOSE 142* 114*  BUN 30* 26*  CREATININE 1.44* 1.22  CALCIUM 9.4 9.5   Liver Function Tests: Recent Labs  Lab 10/10/20 1025  AST 23  ALT 20   ALKPHOS 73  BILITOT 1.0  PROT 7.4  ALBUMIN 3.6   No results for input(s): LIPASE, AMYLASE in the last 168 hours. No results for input(s): AMMONIA in the last 168 hours. CBC: Recent Labs  Lab 10/06/20 1142 10/10/20 1025  WBC 11.1* 7.4  HGB 12.5* 12.0*  HCT 38.3* 37.6*  MCV 79.1* 80.3  PLT 210 262   Cardiac Enzymes: No results for input(s): CKTOTAL, CKMB, CKMBINDEX, TROPONINI in the last 168 hours. BNP: Invalid input(s): POCBNP CBG: No results for input(s): GLUCAP in the last 168 hours. D-Dimer No results for input(s): DDIMER in the last 72 hours. Hgb A1c No results for input(s): HGBA1C in the last 72 hours. Lipid Profile No results  for input(s): CHOL, HDL, LDLCALC, TRIG, CHOLHDL, LDLDIRECT in the last 72 hours. Thyroid function studies Recent Labs    10/10/20 1025  TSH 1.022   Anemia work up No results for input(s): VITAMINB12, FOLATE, FERRITIN, TIBC, IRON, RETICCTPCT in the last 72 hours. Urinalysis    Component Value Date/Time   COLORURINE YELLOW (A) 10/10/2020 1152   APPEARANCEUR CLEAR (A) 10/10/2020 1152   LABSPEC 1.019 10/10/2020 1152   PHURINE 5.0 10/10/2020 1152   GLUCOSEU NEGATIVE 10/10/2020 1152   HGBUR MODERATE (A) 10/10/2020 1152   BILIRUBINUR NEGATIVE 10/10/2020 1152   KETONESUR NEGATIVE 10/10/2020 1152   PROTEINUR NEGATIVE 10/10/2020 1152   NITRITE NEGATIVE 10/10/2020 1152   LEUKOCYTESUR SMALL (A) 10/10/2020 1152   Sepsis Labs Invalid input(s): PROCALCITONIN,  WBC,  LACTICIDVEN Microbiology Recent Results (from the past 240 hour(s))  Urine culture     Status: Abnormal   Collection Time: 10/06/20  3:48 PM   Specimen: Urine, Clean Catch  Result Value Ref Range Status   Specimen Description   Final    URINE, CLEAN CATCH Performed at [facility], [address] [place], [place] [address]    Special Requests   Final    NONE Performed at [facility], [address], [place], [place] [address]    Culture >=100,000  COLONIES/mL KLEBSIELLA PNEUMONIAE (A)  Final   Report Status 10/09/2020 FINAL  Final   Organism ID, Bacteria KLEBSIELLA PNEUMONIAE (A)  Final      Susceptibility   Klebsiella pneumoniae - MIC*    AMPICILLIN RESISTANT Resistant     CEFAZOLIN <=4 SENSITIVE Sensitive     CEFEPIME <=0.12 SENSITIVE Sensitive     CEFTRIAXONE <=0.25 SENSITIVE Sensitive     CIPROFLOXACIN <=0.25 SENSITIVE Sensitive     GENTAMICIN <=1 SENSITIVE Sensitive     IMIPENEM <=0.25 SENSITIVE Sensitive     NITROFURANTOIN 32 SENSITIVE Sensitive     TRIMETH/SULFA >=320 RESISTANT Resistant     AMPICILLIN/SULBACTAM <=2 SENSITIVE Sensitive     PIP/TAZO <=4 SENSITIVE Sensitive     * >=100,000 COLONIES/mL KLEBSIELLA PNEUMONIAE  Resp Panel by RT-PCR (Flu A&B, Covid) Nasopharyngeal Swab     Status: None   Collection Time: 10/10/20  2:06 PM   Specimen: Nasopharyngeal Swab; Nasopharyngeal(NP) swabs in vial transport medium  Result Value Ref Range Status   SARS Coronavirus 2 by RT PCR NEGATIVE NEGATIVE Final    Comment: (NOTE) SARS-CoV-2 target nucleic acids are NOT DETECTED.  The SARS-CoV-2 RNA is generally detectable in upper respiratory specimens during the acute phase of infection. The lowest concentration of SARS-CoV-2 viral copies this assay can detect is 138 copies/mL. A negative result does not preclude SARS-Cov-2 infection and should not be used as the sole basis for treatment or other patient management decisions. A negative result may occur with  improper specimen collection/handling, submission of specimen other than nasopharyngeal swab, presence of viral mutation(s) within the areas targeted by this assay, and inadequate number of viral copies(<138 copies/mL). A negative result must be combined with clinical observations, patient history, and epidemiological information. The expected result is Negative.  Fact Sheet for Patients:  BloggerCourse.com  Fact Sheet for Healthcare Providers:   SeriousBroker.it  This test is no t yet approved or cleared by the [place] FDA and  has been authorized for detection and/or diagnosis of SARS-CoV-2 by FDA under an Emergency Use Authorization (EUA). This EUA will remain  in effect (meaning this test can be used) for the duration of the COVID-19 declaration  under Section 564(b)(1) of the Act, 21 U.S.C.section 360bbb-3(b)(1), unless the authorization is terminated  or revoked sooner.       Influenza A by PCR NEGATIVE NEGATIVE Final   Influenza B by PCR NEGATIVE NEGATIVE Final    Comment: (NOTE) The Xpert Xpress SARS-CoV-2/FLU/RSV plus assay is intended as an aid in the diagnosis of influenza from Nasopharyngeal swab specimens and should not be used as a sole basis for treatment. Nasal washings and aspirates are unacceptable for Xpert Xpress SARS-CoV-2/FLU/RSV testing.  Fact Sheet for Patients: BloggerCourse.com  Fact Sheet for Healthcare Providers: SeriousBroker.it  This test is not yet approved or cleared by the [place] FDA and has been authorized for detection and/or diagnosis of SARS-CoV-2 by FDA under an Emergency Use Authorization (EUA). This EUA will remain in effect (meaning this test can be used) for the duration of the COVID-19 declaration under Section 564(b)(1) of the Act, 21 U.S.C. section 360bbb-3(b)(1), unless the authorization is terminated or revoked.  Performed at [facility], [address], [place], [place] [address]      Time coordinating discharge: 15 min  SIGNED:   Illene Regulus, MD  [facility] 10/10/2020, 4:45 PM Pager  [phone]  If 7PM-7AM, please contact night-coverage [email or web address] Password [email or web address]

## 2020-10-10 NOTE — Evaluation (Signed)
Physical Therapy Evaluation Patient Details Name: Jim Blake MRN: [id number] DOB: [birth year] Today's Date: 10/10/2020   History of Present Illness  85 y.o. male has a past medical history of dementia, A. fib on Eliquis, anxiety, CKD, HTN and recent diagnosis of UTI on 6/11 after a fall.  When home and pt had another fall, now with fall with contusion under L eye.  Clinical Impression  Pt with confusion t/o the entire session, family present and able to help guide him.  He showed great strength and was able to ambulation ~100 ft (mostly with walker) but consistently had some stagger stepping (more prevalent with decreased UE support) and though he no overt LOBs PT needed to give close supervision the entire time.  Pt had never used a walker before yesterday and showed poor awareness in using it, especially during turns, and needed constant assist to insure proper use safety.  Pt's confusion will make gait training with walker difficult, but given his unsteadiness he will need to consistently use an AD.  Family reports that they will be able to provide assistance t/o the day, encouraged 24/7 assist and HHPT.      Follow Up Recommendations Home health PT;Supervision/Assistance - 24 hour    Equipment Recommendations  None recommended by PT (family recent purchased a FWW)    Recommendations for Other Services       Precautions / Restrictions Precautions Precautions: Fall Precaution Comments: Pt has apparently not had any falls in the last year prior to this week. Restrictions Weight Bearing Restrictions: No      Mobility  Bed Mobility               General bed mobility comments: seated EOB on arrival, returned to EOB post session    Transfers Overall transfer level: Needs assistance Equipment used: Rolling walker (2 wheeled) Transfers: Sit to/from Stand Sit to Stand: Min guard         General transfer comment: Pt did not need physical assist to rise to standing, but close  guard and cuing to initiate movement and use walker appropriately  Ambulation/Gait Ambulation/Gait assistance: Min guard;Min assist Gait Distance (Feet): 100 Feet Assistive device: Rolling walker (2 wheeled)       General Gait Details: Pt was able to ambulate with halting, inconsistent gait.  Pt with stagger stepping (more prevelant w/o AD than with) and more pressingly struggled to stay inside walker during every turn; despite cuing he was unable to keep hips inside BOS  Stairs            Wheelchair Mobility    Modified Rankin (Stroke Patients Only)       Balance                                             Pertinent Vitals/Pain Pain Assessment:  (points to confusion on L eye but does not rate when asked)    Home Living Family/patient expects to be discharged to:: Private residence Living Arrangements: Spouse/significant other Available Help at Discharge: Family;Available PRN/intermittently (son stops in nearly every day and states most days he could spend all day if needed. Just hime and 39 year old wife at night) Type of Home: House Home Access: Ramped entrance (small threshold at door)       Home Equipment: Dan Humphreys - 2 wheels (just purchased this week, instructed family to use front  wheel option)      Prior Function Level of Independence: Needs assistance   Gait / Transfers Assistance Needed: apparently until this week he could do most of what he needed with verbal cuing but did not need AD or phyiscal assist  ADL's / Homemaking Assistance Needed: wife and local family assist with meals, errands, etc        Hand Dominance        Extremity/Trunk Assessment   Upper Extremity Assessment Upper Extremity Assessment: Overall WFL for tasks assessed    Lower Extremity Assessment Lower Extremity Assessment: Overall WFL for tasks assessed       Communication   Communication: No difficulties  Cognition Arousal/Alertness:  Awake/alert Behavior During Therapy: Restless Overall Cognitive Status: Impaired/Different from baseline                                 General Comments: baseline dementia that family reports is much worse than normal.  Unable to answer most questions, could not state birthday or situation, minimally verbal      General Comments      Exercises     Assessment/Plan    PT Assessment Patient needs continued PT services  PT Problem List Decreased activity tolerance;Decreased balance;Decreased mobility;Decreased cognition;Decreased knowledge of use of DME;Decreased safety awareness       PT Treatment Interventions DME instruction;Gait training;Functional mobility training;Therapeutic activities;Therapeutic exercise;Balance training;Cognitive remediation;Patient/family education    PT Goals (Current goals can be found in the Care Plan section)  Acute Rehab PT Goals Patient Stated Goal: family hoping to take pt back home PT Goal Formulation: With patient Time For Goal Achievement: 10/24/20 Potential to Achieve Goals: Good    Frequency Min 2X/week   Barriers to discharge        Co-evaluation               AM-PAC PT "6 Clicks" Mobility  Outcome Measure Help needed turning from your back to your side while in a flat bed without using bedrails?: None Help needed moving from lying on your back to sitting on the side of a flat bed without using bedrails?: None Help needed moving to and from a bed to a chair (including a wheelchair)?: A Little Help needed standing up from a chair using your arms (e.g., wheelchair or bedside chair)?: A Little Help needed to walk in hospital room?: A Little Help needed climbing 3-5 steps with a railing? : A Lot 6 Click Score: 19    End of Session Equipment Utilized During Treatment: Gait belt Activity Tolerance: Patient tolerated treatment well Patient left: in bed;with family/visitor present;with nursing/sitter in room Nurse  Communication: Mobility status PT Visit Diagnosis: Unsteadiness on feet (R26.81);Difficulty in walking, not elsewhere classified (R26.2);Repeated falls (R29.6)    Time: 7628-3151 PT Time Calculation (min) (ACUTE ONLY): 27 min   Charges:   PT Evaluation $PT Eval Low Complexity: 1 Low PT Treatments $Gait Training: 8-22 mins        Malachi Pro, DPT 10/10/2020, 3:05 PM

## 2020-10-10 NOTE — ED Provider Notes (Addendum)
[facility] Emergency Department Provider Note  ____________________________________________   None    (approximate)  I have reviewed the triage vital signs and the nursing notes.   HISTORY  Chief Complaint Fall and Altered Mental Status   HPI Jim Blake is a 85 y.o. male has a past medical history of dementia, A. fib on Eliquis, anxiety, CKD, HTN and recent diagnosis of UTI on 6/11 after a fall the day before discharged on Keflex who presents accompanied by his son he states the patient fell again yesterday and seemed to be more confused than usual.  Patient is unable provide any history is endorsing a little soreness around his left eye.  His son states he is not normally able to provide any history regarding recent events but he feels he is little more confused than usual.  States he has been taking his medications at that they have recently increased his lisinopril.  States he saw the patient fall yesterday did not think he passed out or injured anywhere else only hitting his head.  Patient states he does not remember this.  Patient denies any other pain including in his chest abdomen or back.  Per patient and son he has not had any cough, fevers, shortness of breath, vomiting, diarrhea or any other acute sick symptoms.         Past Medical History:  Diagnosis Date   Atrial fibrillation (HCC)    persistent   on NOAC   Hypertension    Vitamin B 12 deficiency     Patient Active Problem List   Diagnosis Date Noted   Pyuria, sterile 10/10/2020   Essential hypertension 10/10/2020   Anxiety 10/10/2020   B12 deficiency 10/10/2020   Atrial fibrillation, chronic (HCC) 10/10/2020   Orthostatic hypotension 10/10/2020   Multiple falls 10/10/2020    History reviewed. No pertinent surgical history.  Prior to Admission medications   Medication Sig Start Date End Date Taking? Authorizing Provider  apixaban (ELIQUIS) 5 MG TABS tablet Take 5 mg by mouth 2  (two) times daily.   Yes [provider]  cephALEXin (KEFLEX) 500 MG capsule Take 1 capsule (500 mg total) by mouth 2 (two) times daily for 7 days. 10/06/20 10/13/20 Yes Triplett, Cari B, FNP  lisinopril-hydrochlorothiazide (ZESTORETIC) 20-12.5 MG tablet Take 1 tablet by mouth 2 (two) times daily.   Yes [provider]  Multiple Vitamins-Minerals (MULTIVITAMIN WITH MINERALS) tablet Take 1 tablet by mouth daily.   Yes [provider]  sertraline (ZOLOFT) 100 MG tablet Take 100 mg by mouth daily.   Yes [provider]    Allergies Patient has no known allergies.  No family history on file.  Social History Social History   Tobacco Use   Smoking status: Some Days    Pack years: 0.00    Types: Cigars   Smokeless tobacco: Never  Substance Use Topics   Alcohol use: Never   Drug use: Never    Review of Systems  Review of Systems  Unable to perform ROS: Mental status change     ____________________________________________   PHYSICAL EXAM:  VITAL SIGNS: ED Triage Vitals  Enc Vitals Group     BP 10/10/20 0952 134/71     Pulse Rate 10/10/20 0952 61     Resp 10/10/20 0952 17     Temp 10/10/20 0952 98.4 F (36.9 C)     Temp Source 10/10/20 0952 Oral     SpO2 10/10/20 0952 97 %  Weight 10/10/20 0953 190 lb (86.2 kg)     Height 10/10/20 0953 6' (1.829 m)     Head Circumference --      Peak Flow --      Pain Score 10/10/20 0953 0     Pain Loc --      Pain Edu? --      Excl. in GC? --    Vitals:   10/10/20 1230 10/10/20 1546  BP:  (!) 128/92  Pulse: 74 60  Resp: 15 17  Temp:    SpO2: 98% 97%   Physical Exam Vitals and nursing note reviewed.  Constitutional:      Appearance: He is well-developed.  HENT:     Head: Normocephalic.     Right Ear: External ear normal.     Left Ear: External ear normal.  Eyes:     Conjunctiva/sclera: Conjunctivae normal.  Cardiovascular:     Rate and Rhythm: Normal rate. Rhythm irregular.      Heart sounds: No murmur heard. Pulmonary:     Effort: Pulmonary effort is normal. No respiratory distress.     Breath sounds: Normal breath sounds.  Abdominal:     Palpations: Abdomen is soft.     Tenderness: There is no abdominal tenderness.  Musculoskeletal:     Cervical back: Neck supple.  Skin:    General: Skin is warm and dry.     Capillary Refill: Capillary refill takes less than 2 seconds.  Neurological:     General: No focal deficit present.     Mental Status: He is alert. He is disoriented and confused.     Motor: Tremor present.  Psychiatric:        Mood and Affect: Mood normal.    Cranial nerves II through XII grossly intact.  No finger dysmetria.  Tremor in bilateral upper extremities.  Symmetric strength in the bilateral upper and lower extremities.  Sensation intact to light touch in all extremities.  2+ radial pulses.  No tenderness or step-offs over the C/T/L-spine.  No obvious trauma otherwise to the extremities chest or abdomen.  Oropharynx is unremarkable.  There are some ecchymosis around the left eye but no other obvious trauma to the face scalp head or neck. ____________________________________________   LABS (all labs ordered are listed, but only abnormal results are displayed)  Labs Reviewed  COMPREHENSIVE METABOLIC PANEL - Abnormal; Notable for the following components:      Result Value   Potassium 3.1 (*)    Glucose, Bld 114 (*)    BUN 26 (*)    GFR, Estimated 59 (*)    All other components within normal limits  CBC - Abnormal; Notable for the following components:   Hemoglobin 12.0 (*)    HCT 37.6 (*)    MCH 25.6 (*)    All other components within normal limits  URINALYSIS, COMPLETE (UACMP) WITH MICROSCOPIC - Abnormal; Notable for the following components:   Color, Urine YELLOW (*)    APPearance CLEAR (*)    Hgb urine dipstick MODERATE (*)    Leukocytes,Ua SMALL (*)    All other components within normal limits  RESP PANEL BY RT-PCR (FLU A&B,  COVID) ARPGX2  URINE CULTURE  TSH  TROPONIN I (HIGH SENSITIVITY)  TROPONIN I (HIGH SENSITIVITY)   ____________________________________________  EKG  A. fib with a rate of 62, nonspecific ST changes in lead I and II without any other clearance of acute ischemia.  Unremarkable intervals. ____________________________________________  RADIOLOGY  ED MD interpretation:  CT head shows no evidence of acute intracranial process or skull fracture.  Atrophy and mild chronic microvascular changes.  CT C-spine shows no clear acute injury.  Official radiology report(s): DG Chest 2 View  Result Date: 10/10/2020 CLINICAL DATA:  Pain following fall EXAM: CHEST - 2 VIEW COMPARISON:  None. FINDINGS: There is slight left base atelectasis. Lungs elsewhere are clear. Heart is slightly enlarged with pulmonary vascularity normal. No adenopathy. There is degenerative change in the thoracic spine. 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 No pneumothorax. IMPRESSION: Slight left base atelectasis. No edema or airspace opacity. Heart slightly enlarged. No pneumothorax. Electronically Signed   By: Bretta Bang III M.D.   On: 10/10/2020 13:00   CT Head Wo Contrast  Result Date: 10/10/2020 CLINICAL DATA:  Fall, head injury EXAM: CT HEAD WITHOUT CONTRAST TECHNIQUE: Contiguous axial images were obtained from the base of the skull through the vertex without intravenous contrast. COMPARISON:  CT head 10/06/2020 FINDINGS: Brain: Generalized atrophy. Mild ventricular enlargement unchanged and consistent with atrophy. Mild chronic microvascular ischemic change in the white matter and right basal ganglia. Negative for acute infarct, hemorrhage, mass Vascular: Negative for hyperdense vessel. Skull: Negative Sinuses/Orbits: Mild mucosal edema paranasal sinuses. Right cataract extraction Other: None IMPRESSION: No acute abnormality Atrophy and mild chronic microvascular ischemic change. Electronically  Signed   By: Marlan Palau M.D.   On: 10/10/2020 10:16   CT Cervical Spine Wo Contrast  Result Date: 10/10/2020 CLINICAL DATA:  Neck trauma.  Additional provided: Fall. EXAM: CT CERVICAL SPINE WITHOUT CONTRAST TECHNIQUE: Multidetector CT imaging of the cervical spine was performed without intravenous contrast. Multiplanar CT image reconstructions were also generated. COMPARISON:  CT cervical spine 10/06/2020. FINDINGS: Alignment: Trace C7-T1 and T1-T2 grade 1 anterolisthesis, unchanged. Skull base and vertebrae: The basion-dental and atlanto-dental intervals are maintained.No evidence of acute fracture to the cervical spine. Soft tissues and spinal canal: No prevertebral fluid or swelling. No visible canal hematoma. Disc levels: Cervical spondylosis with multilevel disc space narrowing, disc bulges, endplate spurring, posterior disc osteophytes, uncovertebral hypertrophy and facet arthrosis. Disc space narrowing is greatest at C5-C6, C6-C7, T1-T2 and T2-T3 (advanced at these levels). Multilevel spinal canal stenosis. Most notably, there is suspected moderate spinal canal stenosis at C4-C5, C5-C6 and C6-C7. Multilevel bony neural foraminal narrowing. Upper chest: No consolidation within the imaged lung apices. No visible pneumothorax. IMPRESSION: No evidence of acute fracture to the cervical spine. Trace C7-T1 and T1-T2 grade 1 anterolisthesis, unchanged. Cervical spondylosis, as described. Electronically Signed   By: Jackey Loge DO   On: 10/10/2020 11:05    ____________________________________________   PROCEDURES  Procedure(s) performed (including Critical Care):  .1-3 Lead EKG Interpretation  Date/Time: 10/10/2020 4:43 PM Performed by: Gilles Chiquito, MD Authorized by: Gilles Chiquito, MD     Interpretation: normal     ECG rate assessment: normal     Rhythm: atrial fibrillation     Ectopy: none     Conduction: normal     ____________________________________________   INITIAL  IMPRESSION / ASSESSMENT AND PLAN / ED COURSE      Patient presents with above to history exam for assessment of a fall yesterday in the setting of being treated for UTI after fall couple days before with some underlying dementia and being anticoagulant Eliquis.  He is accompanied by his son who feels he is little more confused than usual.  On arrival he is afebrile hemodynamically stable.  He is little bruising around his left  eye but otherwise no obvious evidence of trauma on exam.  He is not oriented but otherwise has a nonfocal exam.  With regard to recent falls differential includes ongoing symptomatic UTI, metabolic derangements, dehydration, arrhythmia, ACS, and possible effects of TBI from fall couple days ago..   No focal deficits to suggest CVA.  Suspicion for toxic ingestion.  CT head shows no evidence of acute intracranial process or skull fracture.  Atrophy and mild chronic microvascular changes.  CT C-spine shows no clear acute injury.  CMP with a K of 3.1 without any other significant derangements.  BC without leukocytosis or acute anemia.  Troponin nonelevated x2 and overall Evalose patient for ACS despite nonspecific changes on ECG.  Patient seems appropriately rate controlled at this time.  TSH is unremarkable.  COVID is negative.  UA with small leukocyte esterase and 11-20 RBCs and 21-50 WBCs and moderate hemoglobin.  Given concern for possible persistent UTI urine culture sent patient given a dose of Rocephin.  Unclear etiology for patient's acute on chronic confusion although certainly possible slight ongoing UTI versus TBI.  Seems mildly orthostatic emergency room and IV fluids were also ordered.  He recently had blood pressure medicines titrated and is possible this is also contributing to his unsteadiness and falls.  Given patient's age with endocrinology for recurrent falls and multiple possible etiologies at this time and patient not at baseline we will plan to admit to  medicine service for further evaluation and management.   ____________________________________________   FINAL CLINICAL IMPRESSION(S) / ED DIAGNOSES  Final diagnoses:  Altered mental status, unspecified altered mental status type  Acute cystitis with hematuria  Anticoagulated  Longstanding persistent atrial fibrillation (HCC)  Contusion of face, initial encounter  Fall, initial encounter    Medications  nicotine (NICODERM CQ - dosed in mg/24 hours) patch 14 mg (14 mg Transdermal Patch Applied 10/10/20 1350)  cephALEXin (KEFLEX) capsule 500 mg (has no administration in time range)  sertraline (ZOLOFT) tablet 100 mg (has no administration in time range)  apixaban (ELIQUIS) tablet 5 mg (has no administration in time range)  0.45 % sodium chloride infusion (has no administration in time range)  acetaminophen (TYLENOL) tablet 650 mg (has no administration in time range)    Or  acetaminophen (TYLENOL) suppository 650 mg (has no administration in time range)  lisinopril (ZESTRIL) tablet 20 mg (has no administration in time range)    And  hydrochlorothiazide (MICROZIDE) capsule 12.5 mg (has no administration in time range)  risperiDONE (RISPERDAL) tablet 1 mg (has no administration in time range)  potassium chloride SA (KLOR-CON) CR tablet 40 mEq (40 mEq Oral Given 10/10/20 1201)  lactated ringers bolus 1,000 mL (1,000 mLs Intravenous New Bag/Given 10/10/20 1351)  cefTRIAXone (ROCEPHIN) 1 g in sodium chloride 0.9 % 100 mL IVPB (0 g Intravenous Stopped 10/10/20 1412)  acetaminophen (TYLENOL) tablet 650 mg (650 mg Oral Given 10/10/20 1347)     ED Discharge Orders     None        Note:  This document was prepared using Dragon voice recognition software and may include unintentional dictation errors.    Gilles Chiquito, MD 10/10/20 1644    Gilles Chiquito, MD 10/10/20 2126

## 2020-10-11 LAB — URINE CULTURE: Culture: NO GROWTH

## 2020-12-05 DIAGNOSIS — N1831 Chronic kidney disease, stage 3a: Secondary | ICD-10-CM | POA: Insufficient documentation

## 2022-03-13 IMAGING — CT CT HEAD W/O CM
4 of 6 series · 15 of 47 positions shown, 17 images · non-contrast
Comparison: CT head 10/06/2020

CLINICAL DATA: Fall, head injury

EXAM:
CT HEAD WITHOUT CONTRAST
TECHNIQUE: Contiguous axial images were obtained from the base of the skull
through the vertex without intravenous contrast.

[Series 3: head wo · axial · 0.43mm/px · z∈[-136,-31]mm · 6 of 31 slices shown, 8 images]
[im 5/31  brain]
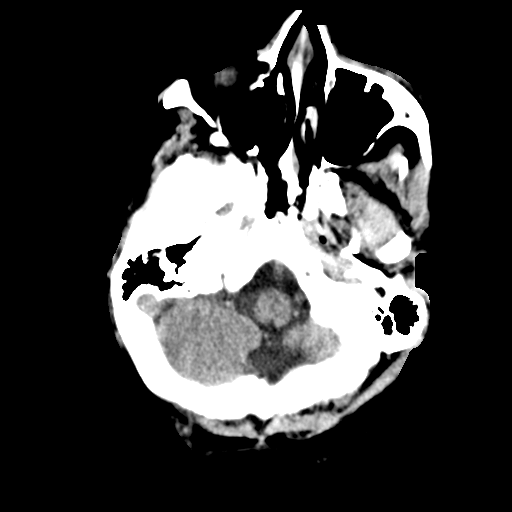
[im 5/31  bone]
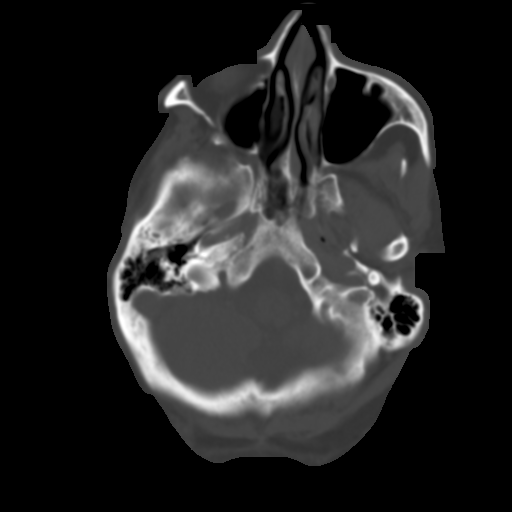
[im 9/31  brain]
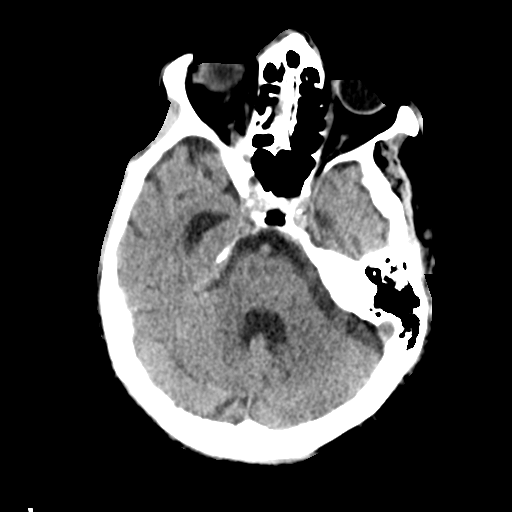
[im 13/31  brain]
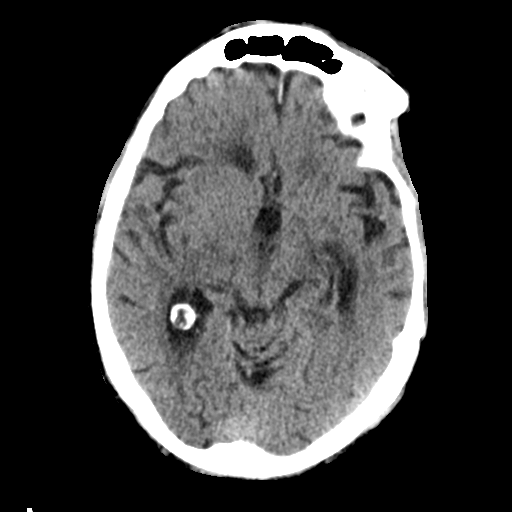
[im 18/31  brain]
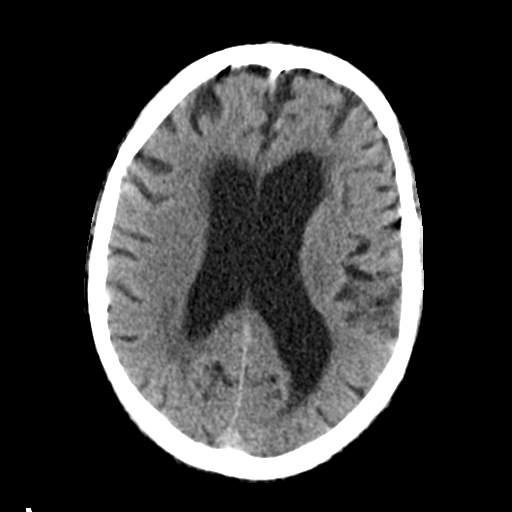
[im 22/31  brain]
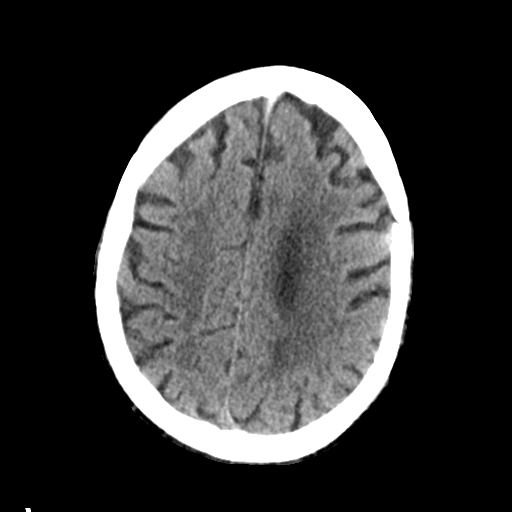
[im 22/31  bone]
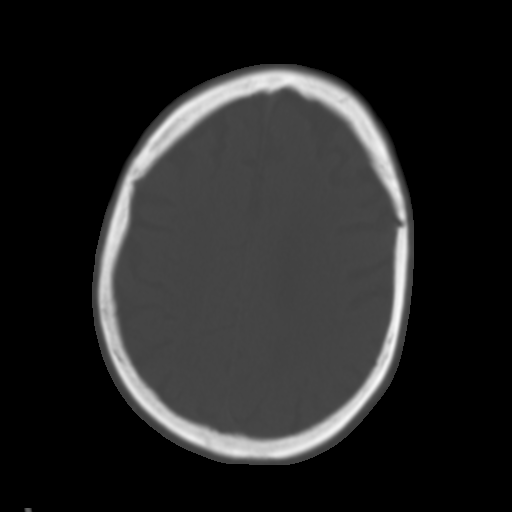
[im 26/31  brain]
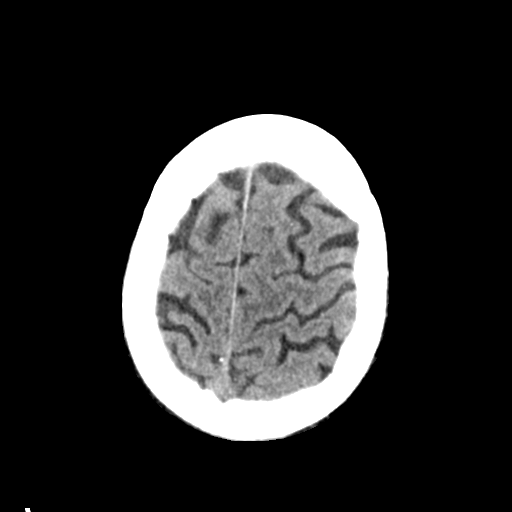

[Series 4: head wo recon · axial · 0.37mm/px · z∈[-128,-81]mm · 3 of 34 slices shown]
[im 5/34  brain]
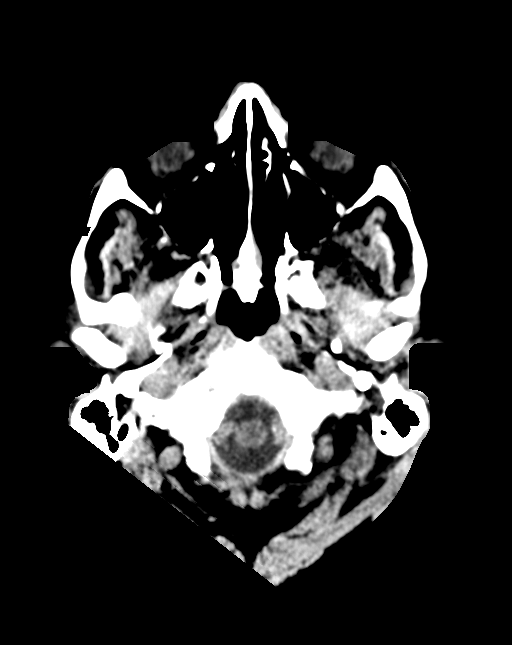
[im 10/34  brain]
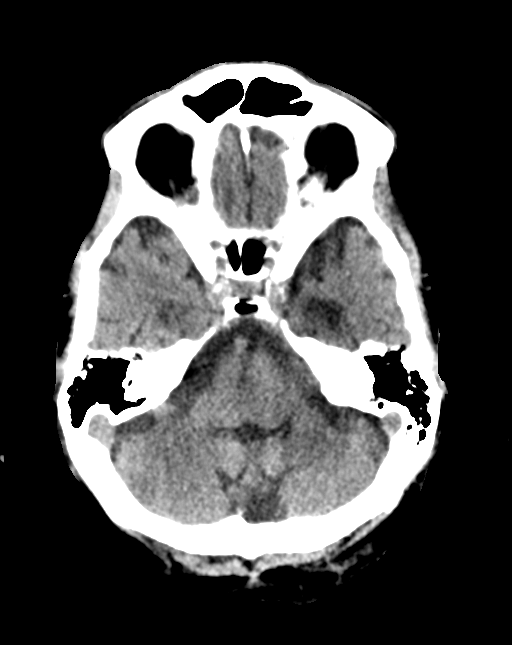
[im 15/34  brain]
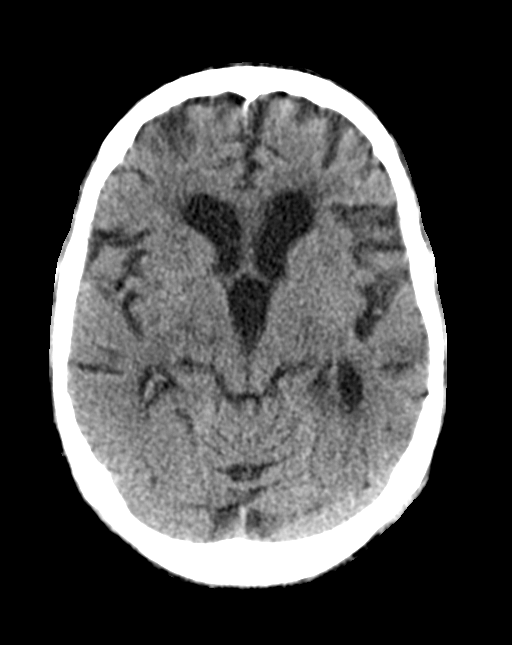

[Series 7: coronal soft tissue · coronal · 0.31mm/px · 3 of 67 slices shown]
[im 23/67  brain]
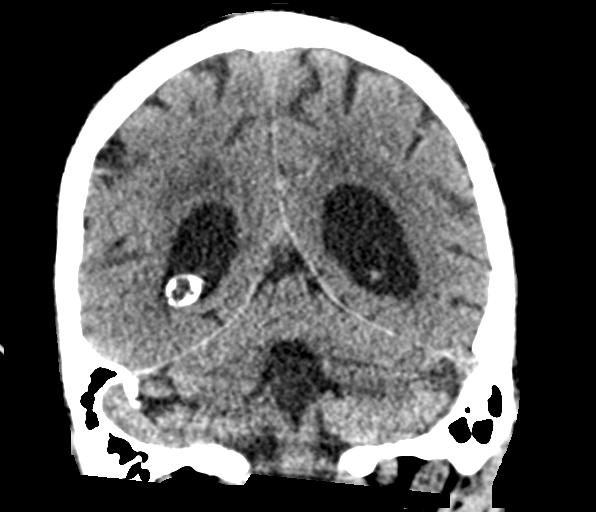
[im 30/67  brain]
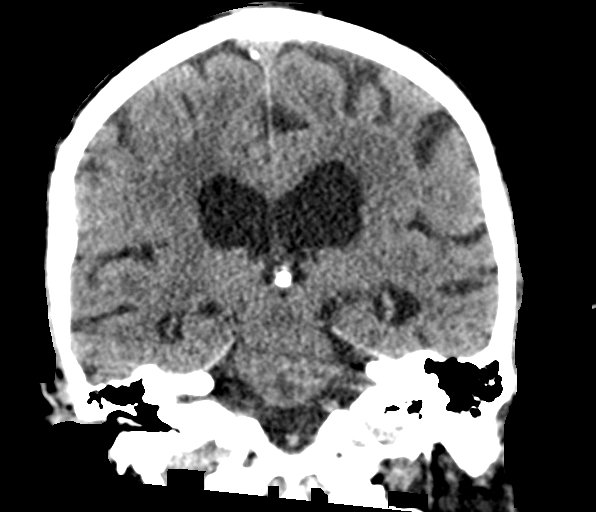
[im 37/67  brain]
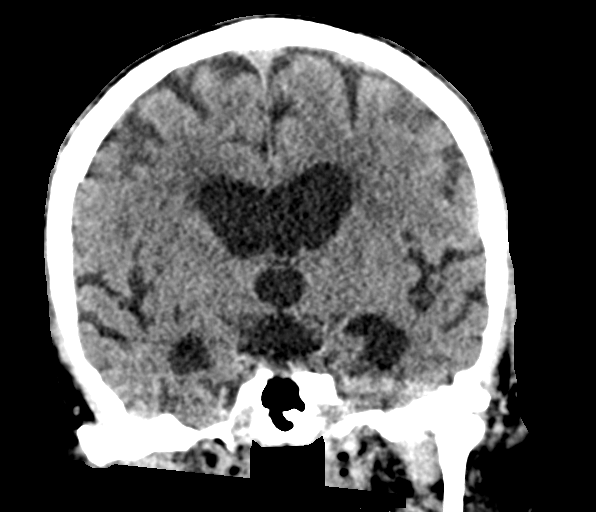

[Series 8: sagittal soft tissue · sagittal · 0.33mm/px · 3 of 53 slices shown]
[im 18/53  brain]
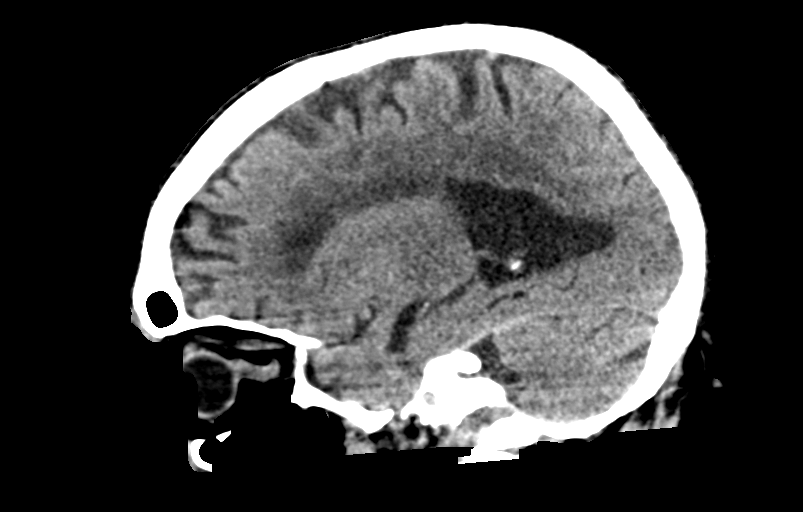
[im 27/53  brain]
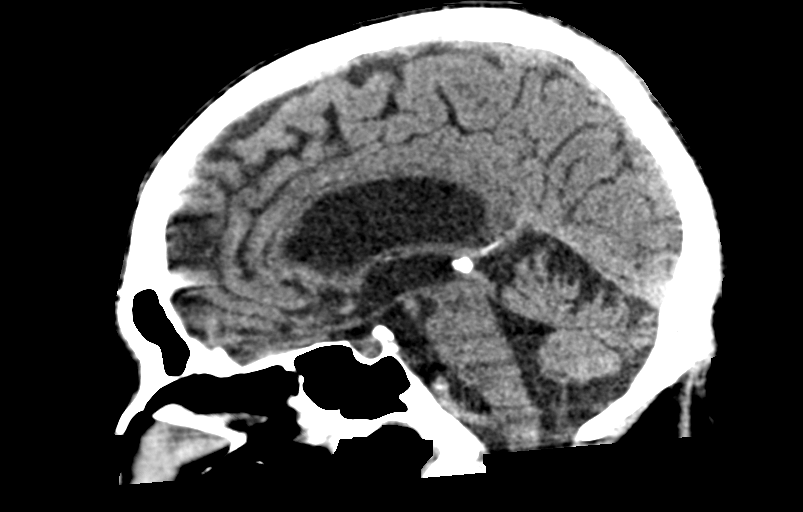
[im 35/53  brain]
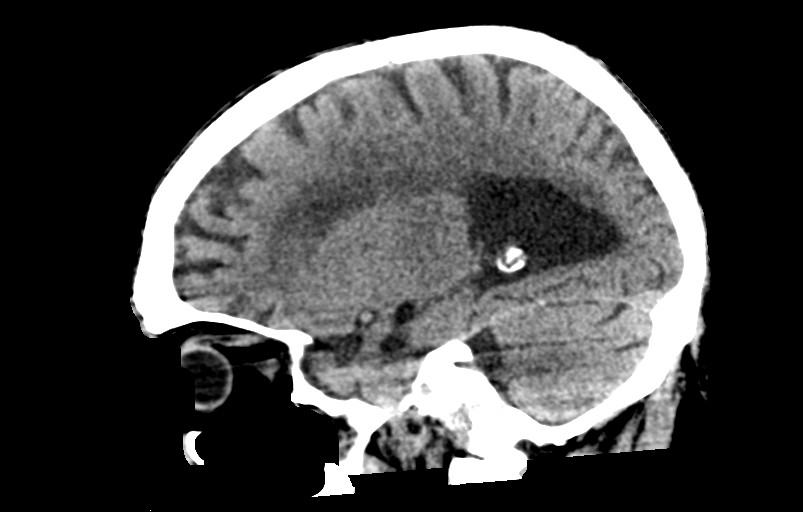

[15 of 47 positions shown; findings below may reference images not displayed]

FINDINGS: Brain: Generalized atrophy. Mild ventricular enlargement unchanged
and consistent with atrophy. Mild chronic microvascular ischemic
change in the white matter and right basal ganglia.

Negative for acute infarct, hemorrhage, mass

Vascular: Negative for hyperdense vessel.

Skull: Negative

Sinuses/Orbits: Mild mucosal edema paranasal sinuses. Right cataract
extraction

Other: None
IMPRESSION: No acute abnormality

Atrophy and mild chronic microvascular ischemic change.

## 2022-04-28 ENCOUNTER — Emergency Department: Payer: Medicare Other

## 2022-04-28 ENCOUNTER — Other Ambulatory Visit: Payer: Self-pay

## 2022-04-28 DIAGNOSIS — E538 Deficiency of other specified B group vitamins: Secondary | ICD-10-CM | POA: Diagnosis present

## 2022-04-28 DIAGNOSIS — Z1152 Encounter for screening for COVID-19: Secondary | ICD-10-CM

## 2022-04-28 DIAGNOSIS — Z7901 Long term (current) use of anticoagulants: Secondary | ICD-10-CM

## 2022-04-28 DIAGNOSIS — F0393 Unspecified dementia, unspecified severity, with mood disturbance: Secondary | ICD-10-CM | POA: Diagnosis present

## 2022-04-28 DIAGNOSIS — I480 Paroxysmal atrial fibrillation: Secondary | ICD-10-CM | POA: Diagnosis present

## 2022-04-28 DIAGNOSIS — E878 Other disorders of electrolyte and fluid balance, not elsewhere classified: Secondary | ICD-10-CM | POA: Diagnosis present

## 2022-04-28 DIAGNOSIS — J101 Influenza due to other identified influenza virus with other respiratory manifestations: Principal | ICD-10-CM | POA: Diagnosis present

## 2022-04-28 DIAGNOSIS — F32A Depression, unspecified: Secondary | ICD-10-CM | POA: Diagnosis present

## 2022-04-28 DIAGNOSIS — A0811 Acute gastroenteropathy due to Norwalk agent: Secondary | ICD-10-CM | POA: Diagnosis present

## 2022-04-28 DIAGNOSIS — I10 Essential (primary) hypertension: Secondary | ICD-10-CM | POA: Diagnosis present

## 2022-04-28 DIAGNOSIS — G9341 Metabolic encephalopathy: Secondary | ICD-10-CM | POA: Diagnosis present

## 2022-04-28 DIAGNOSIS — F17290 Nicotine dependence, other tobacco product, uncomplicated: Secondary | ICD-10-CM | POA: Diagnosis present

## 2022-04-28 DIAGNOSIS — F03918 Unspecified dementia, unspecified severity, with other behavioral disturbance: Secondary | ICD-10-CM | POA: Diagnosis present

## 2022-04-28 DIAGNOSIS — E876 Hypokalemia: Secondary | ICD-10-CM | POA: Diagnosis present

## 2022-04-28 DIAGNOSIS — F0394 Unspecified dementia, unspecified severity, with anxiety: Secondary | ICD-10-CM | POA: Diagnosis present

## 2022-04-28 DIAGNOSIS — Z79899 Other long term (current) drug therapy: Secondary | ICD-10-CM

## 2022-04-28 DIAGNOSIS — J9601 Acute respiratory failure with hypoxia: Secondary | ICD-10-CM | POA: Diagnosis present

## 2022-04-28 DIAGNOSIS — E871 Hypo-osmolality and hyponatremia: Secondary | ICD-10-CM | POA: Diagnosis present

## 2022-04-28 LAB — COMPREHENSIVE METABOLIC PANEL
ALT: 15 U/L (ref 0–44)
AST: 23 U/L (ref 15–41)
Albumin: 3.3 g/dL — ABNORMAL LOW (ref 3.5–5.0)
Alkaline Phosphatase: 73 U/L (ref 38–126)
Anion gap: 9 (ref 5–15)
BUN: 20 mg/dL (ref 8–23)
CO2: 28 mmol/L (ref 22–32)
Calcium: 8.6 mg/dL — ABNORMAL LOW (ref 8.9–10.3)
Chloride: 97 mmol/L — ABNORMAL LOW (ref 98–111)
Creatinine, Ser: 1.2 mg/dL (ref 0.61–1.24)
GFR, Estimated: 60 mL/min — ABNORMAL LOW (ref >60)
Glucose, Bld: 104 mg/dL — ABNORMAL HIGH (ref 70–99)
Potassium: 3.9 mmol/L (ref 3.5–5.1)
Sodium: 134 mmol/L — ABNORMAL LOW (ref 135–145)
Total Bilirubin: 0.7 mg/dL (ref 0.3–1.2)
Total Protein: 7 g/dL (ref 6.5–8.1)

## 2022-04-28 LAB — PROTIME-INR
INR: 1.5 — ABNORMAL HIGH (ref 0.8–1.2)
Prothrombin Time: 17.5 seconds — ABNORMAL HIGH (ref 11.4–15.2)

## 2022-04-28 LAB — CBC WITH DIFFERENTIAL/PLATELET
Abs Immature Granulocytes: 0.05 10*3/uL (ref 0.00–0.07)
Basophils Absolute: 0 10*3/uL (ref 0.0–0.1)
Basophils Relative: 0 %
Eosinophils Absolute: 0.1 10*3/uL (ref 0.0–0.5)
Eosinophils Relative: 2 %
HCT: 40.7 % (ref 39.0–52.0)
Hemoglobin: 13.1 g/dL (ref 13.0–17.0)
Immature Granulocytes: 1 %
Lymphocytes Relative: 20 %
Lymphs Abs: 1.6 10*3/uL (ref 0.7–4.0)
MCH: 27 pg (ref 26.0–34.0)
MCHC: 32.2 g/dL (ref 30.0–36.0)
MCV: 83.7 fL (ref 80.0–100.0)
Monocytes Absolute: 0.5 10*3/uL (ref 0.1–1.0)
Monocytes Relative: 7 %
Neutro Abs: 5.7 10*3/uL (ref 1.7–7.7)
Neutrophils Relative %: 70 %
Platelets: 237 10*3/uL (ref 150–400)
RBC: 4.86 MIL/uL (ref 4.22–5.81)
RDW: 13.9 % (ref 11.5–15.5)
WBC: 8 10*3/uL (ref 4.0–10.5)
nRBC: 0 % (ref 0.0–0.2)

## 2022-04-28 LAB — LACTIC ACID, PLASMA: Lactic Acid, Venous: 1.4 mmol/L (ref 0.5–1.9)

## 2022-04-28 LAB — RESP PANEL BY RT-PCR (RSV, FLU A&B, COVID)  RVPGX2
Influenza A by PCR: POSITIVE — AB
Influenza B by PCR: NEGATIVE
Resp Syncytial Virus by PCR: NEGATIVE
SARS Coronavirus 2 by RT PCR: NEGATIVE

## 2022-04-28 NOTE — ED Notes (Signed)
Pt placed on 4L O2 via [place] at this time. Pt O2 saturation currently reading 99%. Lattie Haw, first RN made aware.

## 2022-04-28 NOTE — ED Triage Notes (Signed)
EMS brings pt in from home; dx UTI 8 days ago, currently taking Cipro; hx dementia; increased confusion

## 2022-04-28 NOTE — ED Triage Notes (Addendum)
Pt in from home by EMS with confusion and generalized weakness. Daughter present for triage who provides further info. Dx with UTI 8 days ago, improving UTI symptoms after Cipro. Daughter states he's unable to stand today, along with increased AMS. Wife reports he has developed cough the past few days, denies any pain. Sats initially 78% on RA on arrival, now sats 99% on [facility]. Does not wear home O2

## 2022-04-29 ENCOUNTER — Encounter: Payer: Self-pay | Admitting: Family Medicine

## 2022-04-29 ENCOUNTER — Inpatient Hospital Stay
Admission: EM | Admit: 2022-04-29 | Discharge: 2022-05-05 | DRG: 193 | Disposition: A | Discharge status: Home Health | Payer: Medicare Other | Attending: Internal Medicine | Admitting: Internal Medicine

## 2022-04-29 DIAGNOSIS — R531 Weakness: Secondary | ICD-10-CM | POA: Diagnosis not present

## 2022-04-29 DIAGNOSIS — R4182 Altered mental status, unspecified: Secondary | ICD-10-CM

## 2022-04-29 DIAGNOSIS — G9340 Encephalopathy, unspecified: Secondary | ICD-10-CM | POA: Diagnosis not present

## 2022-04-29 DIAGNOSIS — J101 Influenza due to other identified influenza virus with other respiratory manifestations: Secondary | ICD-10-CM | POA: Diagnosis not present

## 2022-04-29 DIAGNOSIS — I480 Paroxysmal atrial fibrillation: Secondary | ICD-10-CM | POA: Insufficient documentation

## 2022-04-29 DIAGNOSIS — F03B18 Unspecified dementia, moderate, with other behavioral disturbance: Secondary | ICD-10-CM | POA: Insufficient documentation

## 2022-04-29 DIAGNOSIS — F03918 Unspecified dementia, unspecified severity, with other behavioral disturbance: Secondary | ICD-10-CM | POA: Insufficient documentation

## 2022-04-29 DIAGNOSIS — I10 Essential (primary) hypertension: Secondary | ICD-10-CM | POA: Diagnosis not present

## 2022-04-29 DIAGNOSIS — J111 Influenza due to unidentified influenza virus with other respiratory manifestations: Secondary | ICD-10-CM

## 2022-04-29 DIAGNOSIS — F03B0 Unspecified dementia, moderate, without behavioral disturbance, psychotic disturbance, mood disturbance, and anxiety: Secondary | ICD-10-CM | POA: Insufficient documentation

## 2022-04-29 DIAGNOSIS — F339 Major depressive disorder, recurrent, unspecified: Secondary | ICD-10-CM | POA: Insufficient documentation

## 2022-04-29 DIAGNOSIS — F32A Depression, unspecified: Secondary | ICD-10-CM | POA: Insufficient documentation

## 2022-04-29 LAB — URINALYSIS, ROUTINE W REFLEX MICROSCOPIC
Bilirubin Urine: NEGATIVE
Glucose, UA: NEGATIVE mg/dL
Hgb urine dipstick: NEGATIVE
Ketones, ur: NEGATIVE mg/dL
Leukocytes,Ua: NEGATIVE
Nitrite: NEGATIVE
Protein, ur: NEGATIVE mg/dL
Specific Gravity, Urine: 1.015 (ref 1.005–1.030)
pH: 5 (ref 5.0–8.0)

## 2022-04-29 MED ORDER — APIXABAN 5 MG PO TABS
5.0000 mg | ORAL_TABLET | Freq: Two times a day (BID) | ORAL | Status: DC
  Administered 2022-04-29 – 2022-05-05 (×13): 5 mg via ORAL
  Filled 2022-04-29 (×13): qty 1

## 2022-04-29 MED ORDER — SERTRALINE HCL 50 MG PO TABS
100.0000 mg | ORAL_TABLET | Freq: Every day | ORAL | Status: DC
  Administered 2022-04-29 – 2022-05-05 (×7): 100 mg via ORAL
  Filled 2022-04-29 (×7): qty 2

## 2022-04-29 MED ORDER — ONDANSETRON HCL 4 MG PO TABS: 4.0000 mg | ORAL_TABLET | Freq: Four times a day (QID) | ORAL | Status: DC | PRN

## 2022-04-29 MED ORDER — ADULT MULTIVITAMIN W/MINERALS CH
1.0000 | ORAL_TABLET | Freq: Every day | ORAL | Status: DC
  Administered 2022-04-29 – 2022-05-05 (×7): 1 via ORAL
  Filled 2022-04-29 (×7): qty 1

## 2022-04-29 MED ORDER — HYDROCHLOROTHIAZIDE 12.5 MG PO TABS
12.5000 mg | ORAL_TABLET | Freq: Two times a day (BID) | ORAL | Status: DC
  Administered 2022-04-29 – 2022-04-30 (×4): 12.5 mg via ORAL
  Filled 2022-04-29 (×4): qty 1

## 2022-04-29 MED ORDER — SODIUM CHLORIDE 0.9 % IV SOLN: INTRAVENOUS | Status: DC

## 2022-04-29 MED ORDER — ENOXAPARIN SODIUM 40 MG/0.4ML IJ SOSY: 40.0000 mg | PREFILLED_SYRINGE | INTRAMUSCULAR | Status: DC

## 2022-04-29 MED ORDER — POTASSIUM CHLORIDE CRYS ER 20 MEQ PO TBCR
20.0000 meq | EXTENDED_RELEASE_TABLET | Freq: Every day | ORAL | Status: DC
  Administered 2022-04-29 – 2022-05-01 (×2): 20 meq via ORAL
  Filled 2022-04-29 (×2): qty 1

## 2022-04-29 MED ORDER — MAGNESIUM HYDROXIDE 400 MG/5ML PO SUSP: 30.0000 mL | Freq: Every day | ORAL | Status: DC | PRN

## 2022-04-29 MED ORDER — ACETAMINOPHEN 325 MG PO TABS: 650.0000 mg | ORAL_TABLET | Freq: Four times a day (QID) | ORAL | Status: DC | PRN

## 2022-04-29 MED ORDER — LISINOPRIL-HYDROCHLOROTHIAZIDE 20-12.5 MG PO TABS: 1.0000 | ORAL_TABLET | Freq: Two times a day (BID) | ORAL | Status: DC

## 2022-04-29 MED ORDER — LOPERAMIDE HCL 2 MG PO CAPS
2.0000 mg | ORAL_CAPSULE | ORAL | Status: DC | PRN
  Filled 2022-04-29: qty 1

## 2022-04-29 MED ORDER — OSELTAMIVIR PHOSPHATE 75 MG PO CAPS: 75.0000 mg | ORAL_CAPSULE | Freq: Two times a day (BID) | ORAL | Status: DC

## 2022-04-29 MED ORDER — DONEPEZIL HCL 5 MG PO TABS
10.0000 mg | ORAL_TABLET | Freq: Every day | ORAL | Status: DC
  Administered 2022-04-30 – 2022-05-04 (×6): 10 mg via ORAL
  Filled 2022-04-29 (×6): qty 2

## 2022-04-29 MED ORDER — TRAZODONE HCL 50 MG PO TABS: 25.0000 mg | ORAL_TABLET | Freq: Every evening | ORAL | Status: DC | PRN

## 2022-04-29 MED ORDER — ONDANSETRON HCL 4 MG/2ML IJ SOLN: 4.0000 mg | Freq: Four times a day (QID) | INTRAMUSCULAR | Status: DC | PRN

## 2022-04-29 MED ORDER — ACETAMINOPHEN 650 MG RE SUPP: 650.0000 mg | Freq: Four times a day (QID) | RECTAL | Status: DC | PRN

## 2022-04-29 MED ORDER — OSELTAMIVIR PHOSPHATE 30 MG PO CAPS
30.0000 mg | ORAL_CAPSULE | Freq: Two times a day (BID) | ORAL | Status: AC
  Administered 2022-04-29 – 2022-05-03 (×10): 30 mg via ORAL
  Filled 2022-04-29 (×11): qty 1

## 2022-04-29 MED ORDER — LISINOPRIL 20 MG PO TABS
20.0000 mg | ORAL_TABLET | Freq: Two times a day (BID) | ORAL | Status: DC
  Administered 2022-04-29 – 2022-05-05 (×13): 20 mg via ORAL
  Filled 2022-04-29 (×5): qty 1
  Filled 2022-04-29 (×2): qty 2
  Filled 2022-04-29 (×3): qty 1
  Filled 2022-04-29: qty 2
  Filled 2022-04-29 (×2): qty 1

## 2022-04-29 NOTE — Assessment & Plan Note (Addendum)
-   This created with significant generalized weakness with inability to ambulate. - It is also associated with acute encephalopathy. - He will be admitted to an observation medical bed. - We will monitor his mental status. - We will place him on p.o. Tamiflu. - PT consult will be obtained to assess his ambulation. - She was initially hypoxic though is currently with normal pulse oximetry on room air. - We will hold off his Cipro as it could be contributing to his altered mental status and his UA is clear.

## 2022-04-29 NOTE — Progress Notes (Signed)
PHARMACY NOTE:  ANTIMICROBIAL RENAL DOSAGE ADJUSTMENT  Current antimicrobial regimen includes a mismatch between antimicrobial dosage and estimated renal function.  As per policy approved by the Pharmacy & Therapeutics and Medical Executive Committees, the antimicrobial dosage will be adjusted accordingly.  Current antimicrobial dosage:  Tamiful 75 mg PO BID   Indication: flu   Renal Function:  Estimated Creatinine Clearance: 50.3 mL/min (by C-G formula based on SCr of 1.2 mg/dL). []      On intermittent HD, scheduled: []      On CRRT    Antimicrobial dosage has been changed to:  Tamiflu 30 mg PO BID   Additional comments:   Thank you for allowing pharmacy to be a part of this patient's care.  Jos Cygan D, [facility] 04/29/2022 6:55 AM

## 2022-04-29 NOTE — Assessment & Plan Note (Signed)
-   We will continue Aricept. 

## 2022-04-29 NOTE — Evaluation (Signed)
Occupational Therapy Evaluation Patient Details Name: Jim Blake MRN: [id number] DOB: 01/16/1936 Today's Date: 04/29/2022   History of Present Illness 87 y.o. Caucasian male with medical history significant for atrial fibrillation, hypertension and vitamin B12 deficiency, presented to the emergency room with acute onset of cough over the last 4 days which has been occasionally productive of clear sputum without dyspnea or wheezing however with altered mental status with worsening confusion and disorientation per his family.  He did not have any reported fever or chills.  No dysuria, oliguria or hematuria or flank pain currently.  He was treated for a UTI before Christmas with p.o. Cipro.  He was significantly weak today he could not walk. He is positive for flu A.   Clinical Impression   Patient presenting with decreased Ind in self care, balance, functional mobility/transfers, endurance, and safety awareness. Patient's daughter present to confirm baseline. Pt lives at home with his wife and needs occasional assistance for self care secondary to cognitive deficits. Pt furniture walks at baseline. Pt needing min guard - min HHA for sit <>stand and ambulation in hospital room. Pt is oriented to self only. He uses urinal 3 times during session with increased urgency. Pt stands for ~ 10 minutes with urinal use and changing brief. Patient will benefit from acute OT to increase overall independence in the areas of ADLs, functional mobility, and safety awareness in order to safely discharge home with family.      Recommendations for follow up therapy are one component of a multi-disciplinary discharge planning process, led by the attending physician.  Recommendations may be updated based on patient status, additional functional criteria and insurance authorization.   Follow Up Recommendations  Home health OT     Assistance Recommended at Discharge Frequent or constant Supervision/Assistance  Patient can  return home with the following A little help with walking and/or transfers;A little help with bathing/dressing/bathroom;Help with stairs or ramp for entrance;Assistance with feeding;Assistance with cooking/housework;Assist for transportation;Direct supervision/assist for financial management;Direct supervision/assist for medications management    Functional Status Assessment  Patient has had a recent decline in their functional status and demonstrates the ability to make significant improvements in function in a reasonable and predictable amount of time.  Equipment Recommendations  None recommended by OT       Precautions / Restrictions Precautions Precautions: Fall      Mobility Bed Mobility Overal bed mobility: Needs Assistance Bed Mobility: Supine to Sit, Sit to Supine     Supine to sit: Min assist Sit to supine: Min guard   General bed mobility comments: pt needs increased time and cuing for hand placement and technique    Transfers Overall transfer level: Needs assistance Equipment used: 1 person hand held assist Transfers: Sit to/from Stand, Bed to chair/wheelchair/BSC Sit to Stand: Min guard, Supervision     Step pivot transfers: Min assist, Min guard            Balance Overall balance assessment: Needs assistance Sitting-balance support: Feet supported Sitting balance-Leahy Scale: Good     Standing balance support: During functional activity, Single extremity supported, Bilateral upper extremity supported Standing balance-Leahy Scale: Poor                             ADL either performed or assessed with clinical judgement   ADL Overall ADL's : Needs assistance/impaired     Grooming: Wash/dry hands;Standing;Min guard  Lower Body Dressing: Minimal assistance;Sit to/from stand   Toilet Transfer: Minimal assistance;Min Pension scheme manager Details (indicate cue type and reason): simulated         Functional mobility  during ADLs: Min guard;Minimal assistance       Vision Patient Visual Report: No change from baseline              Pertinent Vitals/Pain Pain Assessment Pain Assessment: Faces Faces Pain Scale: No hurt     Hand Dominance Right   Extremity/Trunk Assessment Upper Extremity Assessment Upper Extremity Assessment: Generalized weakness   Lower Extremity Assessment Lower Extremity Assessment: Generalized weakness       Communication Communication Communication: HOH   Cognition Arousal/Alertness: Awake/alert Behavior During Therapy: Restless, Flat affect Overall Cognitive Status: History of cognitive impairments - at baseline                                 General Comments: Pt's daughter present during assessment reports pt with increased confusion at this time. He does have baseline dementia. Pt oriented to self only but follows commands with increased time and cues.                Home Living Family/patient expects to be discharged to:: Private residence Living Arrangements: Spouse/significant other Available Help at Discharge: Family;Available 24 hours/day (son lives next door and comes over daily) Type of Home: House Home Access: Ramped entrance     Home Layout: One level     Bathroom Shower/Tub: Walk-in shower         Home Equipment: Agricultural consultant (2 wheels)          Prior Functioning/Environment Prior Level of Function : Needs assist             Mobility Comments: Family reports pt furniture walking throughout home for ambulation. He has RW but does not use. ADLs Comments: Pt's daughter reports some occasional assistance secondary to cognition for self care tasks. Family assists with IADLs.        OT Problem List: Decreased strength;Decreased activity tolerance;Impaired balance (sitting and/or standing);Decreased knowledge of use of DME or AE;Cardiopulmonary status limiting activity;Decreased range of motion;Decreased safety  awareness;Decreased cognition      OT Treatment/Interventions: Self-care/ADL training;Therapeutic exercise;Therapeutic activities;Cognitive remediation/compensation;Energy conservation;Patient/family education;DME and/or AE instruction;Balance training    OT Goals(Current goals can be found in the care plan section) Acute Rehab OT Goals Patient Stated Goal: to get stronger OT Goal Formulation: With patient/family Time For Goal Achievement: 05/13/22 Potential to Achieve Goals: Good ADL Goals Pt Will Perform Grooming: with supervision;standing Pt Will Perform Lower Body Dressing: with supervision;sit to/from stand Pt Will Transfer to Toilet: with supervision;ambulating Pt Will Perform Toileting - Clothing Manipulation and hygiene: with supervision;sit to/from stand  OT Frequency: Min 2X/week       AM-PAC OT "6 Clicks" Daily Activity     Outcome Measure Help from another person eating meals?: A Little Help from another person taking care of personal grooming?: A Little Help from another person toileting, which includes using toliet, bedpan, or urinal?: A Little Help from another person bathing (including washing, rinsing, drying)?: A Little Help from another person to put on and taking off regular upper body clothing?: A Little Help from another person to put on and taking off regular lower body clothing?: A Little 6 Click Score: 18   End of Session Nurse Communication: Mobility status  Activity Tolerance: Patient tolerated treatment  well Patient left: in bed;with call bell/phone within reach;with family/visitor present;with bed alarm set  OT Visit Diagnosis: Unsteadiness on feet (R26.81);Muscle weakness (generalized) (M62.81)                Time: 1914-7829 OT Time Calculation (min): 21 min Charges:  OT General Charges $OT Visit: 1 Visit OT Evaluation $OT Eval Moderate Complexity: 1 Mod OT Treatments $Self Care/Home Management : 8-22 mins  Darleen Crocker, MS, OTR/L ,  CBIS ascom [phone]  04/29/22, 12:33 PM

## 2022-04-29 NOTE — Assessment & Plan Note (Signed)
-   We will continue antihypertensives. 

## 2022-04-29 NOTE — Assessment & Plan Note (Signed)
-   We will continue Zoloft 

## 2022-04-29 NOTE — ED Notes (Signed)
Daughter at bedside  Pt cleaned up and placed in bed

## 2022-04-29 NOTE — H&P (Signed)
[place]   PATIENT NAME: Jim Blake    MR#:  [id number]  DATE OF BIRTH:  1936/02/25  DATE OF ADMISSION:  04/29/2022  PRIMARY CARE PHYSICIAN: Juluis Pitch, MD   Patient is coming from: Home  REQUESTING/REFERRING PHYSICIAN: Lucillie Garfinkel, MD  CHIEF COMPLAINT:   Chief Complaint  Patient presents with   Altered Mental Status   Weakness    HISTORY OF PRESENT ILLNESS:  Jim Blake is a 87 y.o. Caucasian male with medical history significant for atrial fibrillation, hypertension and vitamin B12 deficiency, presented to the emergency room with acute onset of cough over the last 4 days which has been occasionally productive of clear sputum without dyspnea or wheezing however with altered mental status with worsening confusion and disorientation per his family.  He did not have any reported fever or chills.  No dysuria, oliguria or hematuria or flank pain currently.  He was treated for a UTI before Christmas with p.o. Cipro.  He was significantly weak today he could not walk.  ED Course: When he came to the ER, respiratory rate was 22 and pulse currently was 78% on room air and later 98% on 4 L of O2 by nasal cannula however the patient on room air was satting at 96 % later on.  Labs revealed mild hyponatremia 134 and hypochloremia of 97 with calcium of 8.6, albumin 3.3.  Lactic acid was 1.4 and CBC was within normal.  Influenza A antigen came back positive and influenza B as well as RSV and COVID-19 PCR came back negative.  UA was negative.  2 blood cultures were drawn. EKG as reviewed by me : And later EKG showed atrial fibrillation with controlled ventricular sponsor of 73, left axis deviation, Q waves in anteroseptal leads Imaging: Portable chest ray showed no acute cardiopulmonary disease.  There was with poor inspiration though.  The patient will be admitted to a medical observation bed for further evaluation and management. PAST MEDICAL HISTORY:   Past Medical History:   Diagnosis Date   Atrial fibrillation ([place])    persistent   on NOAC   Hypertension    Vitamin B 12 deficiency     PAST SURGICAL HISTORY:  No past surgical history on file.  No previous surgeries were reported.  SOCIAL HISTORY:   Social History   Tobacco Use   Smoking status: Some Days    Types: Cigars   Smokeless tobacco: Never  Substance Use Topics   Alcohol use: Never    FAMILY HISTORY:  No family history on file. Unknown per his family and unobtainable DRUG ALLERGIES:  No Known Allergies  REVIEW OF SYSTEMS:   ROS As per history of present illness. All pertinent systems were reviewed above. Constitutional, HEENT, cardiovascular, respiratory, GI, GU, musculoskeletal, neuro, psychiatric, endocrine, integumentary and hematologic systems were reviewed and are otherwise negative/unremarkable except for positive findings mentioned above in the HPI.   MEDICATIONS AT HOME:   Prior to Admission medications   Medication Sig Start Date End Date Taking? Authorizing Provider  apixaban (ELIQUIS) 5 MG TABS tablet Take 5 mg by mouth 2 (two) times daily.   Yes [provider]  ciprofloxacin (CIPRO) 500 MG tablet Take 1 tablet by mouth 2 (two) times daily. 04/22/22 04/29/22 Yes [provider]  donepezil (ARICEPT) 10 MG tablet Take 1 tablet by mouth at bedtime. 02/05/22  Yes [provider]  lisinopril-hydrochlorothiazide (ZESTORETIC) 20-12.5 MG tablet Take 1 tablet by mouth 2 (two) times daily.  Yes [provider]  Multiple Vitamins-Minerals (MULTIVITAMIN WITH MINERALS) tablet Take 1 tablet by mouth daily.   Yes [provider]  Potassium Chloride ER 20 MEQ TBCR Take 1 tablet by mouth daily. 02/05/22  Yes [provider]  sertraline (ZOLOFT) 100 MG tablet Take 100 mg by mouth daily.   Yes [provider]      VITAL SIGNS:  Blood pressure 137/74, pulse 66, temperature 98.2 F (36.8 C), resp. rate 19, height 6' (1.829  m), weight 86.2 kg, SpO2 96 %.  PHYSICAL EXAMINATION:  Physical Exam  GENERAL:  87 y.o.-year-old Caucasian male patient lying in the bed with no acute distress.  EYES: Pupils equal, round, reactive to light and accommodation. No scleral icterus. Extraocular muscles intact.  HEENT: Head atraumatic, normocephalic. Oropharynx and nasopharynx clear.  NECK:  Supple, no jugular venous distention. No thyroid enlargement, no tenderness.  LUNGS: Normal breath sounds bilaterally, no wheezing, rales,rhonchi or crepitation. No use of accessory muscles of respiration.  CARDIOVASCULAR: Regular rate and rhythm, S1, S2 normal. No murmurs, rubs, or gallops.  ABDOMEN: Soft, nondistended, nontender. Bowel sounds present. No organomegaly or mass.  EXTREMITIES: No pedal edema, cyanosis, or clubbing.  NEUROLOGIC: Cranial nerves II through XII are intact. Muscle strength 5/5 in all extremities. Sensation intact. Gait not checked.  PSYCHIATRIC: The patient is awake and alert.  He is disoriented to where he is or what happened.  Normal affect and good eye contact. SKIN: No obvious rash, lesion, or ulcer.   LABORATORY PANEL:   CBC Recent Labs  Lab 04/28/22 1951  WBC 8.0  HGB 13.1  HCT 40.7  PLT 237   ------------------------------------------------------------------------------------------------------------------  Chemistries  Recent Labs  Lab 04/28/22 1951  NA 134*  K 3.9  CL 97*  CO2 28  GLUCOSE 104*  BUN 20  CREATININE 1.20  CALCIUM 8.6*  AST 23  ALT 15  ALKPHOS 73  BILITOT 0.7   ------------------------------------------------------------------------------------------------------------------  Cardiac Enzymes No results for input(s): "TROPONINI" in the last 168 hours. ------------------------------------------------------------------------------------------------------------------  RADIOLOGY:  DG Chest Port 1 View  Result Date: 04/28/2022 CLINICAL DATA:  Altered mental status EXAM:  PORTABLE CHEST 1 VIEW COMPARISON:  10/10/2020 FINDINGS: Transverse diameter of heart is increased. There is poor inspiration. There are no signs of alveolar pulmonary edema or focal pulmonary consolidation. There is no pleural effusion or pneumothorax. Degenerative changes are noted in both shoulders, more so on the right side. IMPRESSION: There are no signs of pulmonary edema or focal pulmonary consolidation. Poor inspiration. Electronically Signed   By: Ernie Avena M.D.   On: 04/28/2022 21:03      IMPRESSION AND PLAN:  Assessment and Plan: * Influenza A - This created with significant generalized weakness with inability to ambulate. - It is also associated with acute encephalopathy. - He will be admitted to an observation medical bed. - We will monitor his mental status. - We will place him on p.o. Tamiflu. - PT consult will be obtained to assess his ambulation. - She was initially hypoxic though is currently with normal pulse oximetry on room air. - We will hold off his Cipro as it could be contributing to his altered mental status and his UA is clear.  Essential hypertension - We will continue antihypertensives.  Depression - We will continue Zoloft.  Dementia with behavioral disturbance (HCC) - We will continue Aricept.  Paroxysmal atrial fibrillation (HCC) - We will continue his Eliquis.   DVT prophylaxis: Lovenox.  Advanced Care Planning:  Code Status:  full code.  Family Communication:  The plan of care was discussed in details with the patient (and family). I answered all questions. The patient agreed to proceed with the above mentioned plan. Further management will depend upon hospital course. Disposition Plan: Back to previous home environment Consults called: none.  All the records are reviewed and case discussed with ED provider.  Status is: Observation  I certify that at the time of admission, it is my clinical judgment that the patient will require  hospital care extending less than 2 midnights.                            Dispo: The patient is from: Home              Anticipated d/c is to: Home              Patient currently is not medically stable to d/c.              Difficult to place patient: No  Christel Mormon M.D on 04/29/2022 at 6:59 AM  [facility]   From 7 PM-7 AM, contact night-coverage [email or web address]  CC: Primary care physician; Juluis Pitch, MD

## 2022-04-29 NOTE — ED Provider Notes (Signed)
[facility] Provider Note    Event Date/Time   First MD Initiated Contact with Patient 04/29/22 [phone]     (approximate)   History   Altered Mental Status and Weakness   HPI  Jim Blake is a 87 y.o. male   Past medical history of AF on Eliquis, hypertension, lives at home with his wife who presents to the emergency department with altered mental status increasing confusion over the past several days as well as a cough.  No reports of trauma.  P.o. intake has been adequate.  History was obtained via limited by patient due to his altered mental status.  Much of the information was obtained by his daughter who is at bedside as an independent historian.  External medical notes including telephone call by his primary doctor Dr. Providence Crosby addressing the patient's chest congestion and ciprofloxacin for recently diagnosed UTI. Patient's family reports diagnosed with UTI at the end of December and prescribed ciprofloxacin with much improvement, then worsening confusion & cough over the last several days as above.     Physical Exam   Triage Vital Signs: ED Triage Vitals  Enc Vitals Group     BP 04/28/22 1949 138/63     Pulse Rate 04/28/22 1949 84     Resp 04/28/22 1949 (!) 22     Temp 04/28/22 1949 99.2 F (37.3 C)     Temp Source 04/28/22 1949 Oral     SpO2 04/28/22 1923 97 %     Weight 04/28/22 1949 190 lb (86.2 kg)     Height 04/28/22 1949 6' (1.829 m)     Head Circumference --      Peak Flow --      Pain Score --      Pain Loc --      Pain Edu? --      Excl. in [place]? --     Most recent vital signs: Vitals:   04/29/22 0120 04/29/22 0300  BP: 98/72 132/76  Pulse: (!) 58 68  Resp: 20 19  Temp: 97.8 F (36.6 C) 98 F (36.7 C)  SpO2: 95% 96%    General: Awake, no distress.  CV:  Good peripheral perfusion.  Resp:  Normal effort.  Abd:  No distention.  Other:  Awake alert and pleasant with no complaints and normal hemodynamics.  Reportedly in  triage was hypoxemic requiring 2 L nasal cannula.  He has no hypoxemia on room air for me.  Disoriented to situation, this is not normal per patient's daughter who is at bedside.  Lungs clear, abdomen soft and nontender appears euvolemic.   ED Results / Procedures / Treatments   Labs (all labs ordered are listed, but only abnormal results are displayed) Labs Reviewed  RESP PANEL BY RT-PCR (RSV, FLU A&B, COVID)  [id number] - Abnormal; Notable for the following components:      Result Value   Influenza A by PCR POSITIVE (*)    All other components within normal limits  COMPREHENSIVE METABOLIC PANEL - Abnormal; Notable for the following components:   Sodium 134 (*)    Chloride 97 (*)    Glucose, Bld 104 (*)    Calcium 8.6 (*)    Albumin 3.3 (*)    GFR, Estimated 60 (*)    All other components within normal limits  PROTIME-INR - Abnormal; Notable for the following components:   Prothrombin Time 17.5 (*)    INR 1.5 (*)    All other components within normal  limits  CULTURE, BLOOD (ROUTINE X 2)  CULTURE, BLOOD (ROUTINE X 2)  LACTIC ACID, PLASMA  CBC WITH DIFFERENTIAL/PLATELET  URINALYSIS, ROUTINE W REFLEX MICROSCOPIC     I reviewed labs and they are notable for influenza A positive.  EKG  ED ECG REPORT I, Lucillie Garfinkel, the attending physician, personally viewed and interpreted this ECG.   Date: 04/29/2022  EKG Time: 2025  Rate: 73  Rhythm: Atrial fibrillation with a rate of 70s  Axis: LAD  Intervals: Atrial fibrillation  ST&T Change: No acute ischemic changes    RADIOLOGY I independently reviewed and interpreted chest x-ray and see no obvious focalities or pneumothorax   PROCEDURES:  Critical Care performed: No  Procedures   MEDICATIONS ORDERED IN ED: Medications - No data to display  Consultants:  I spoke with hospitalist for admission and regarding care plan for this patient.   IMPRESSION / MDM / ASSESSMENT AND PLAN / ED COURSE  I reviewed the triage vital  signs and the nursing notes.                              Differential diagnosis includes, but is not limited to, viral URI, metabolic derangements, infection   The patient is on the cardiac monitor to evaluate for evidence of arrhythmia and/or significant heart rate changes.  MDM: This is a patient with influenza and altered mental status with cough and worsening confusion over the last several days.  UTI treated and had mostly resolved with full course of antibiotics.  HD appropriate and reassuring no hypoxemia on room air when I assessed him.  No lactic acidosis.  Labs otherwise unremarkable, patient stable, will admit for influenza and altered mental status.  Doubt intracranial pathology given no acute neurologic deficits aside from confusion and no trauma reported Patient's presentation is most consistent with acute presentation with potential threat to life or bodily function.       FINAL CLINICAL IMPRESSION(S) / ED DIAGNOSES   Final diagnoses:  Altered mental status, unspecified altered mental status type  Influenza     Rx / DC Orders   ED Discharge Orders     None        Note:  This document was prepared using Dragon voice recognition software and may include unintentional dictation errors.    Lucillie Garfinkel, MD 04/29/22 [phone]

## 2022-04-29 NOTE — ED Notes (Addendum)
Pt had a soiled brief. Pt changed into new brief by this tech and Akia, NT. New bed sheet and chux applied as well. Pt moved up in bed and given new gown and warm blanket. Pt has noted hematochezia within the stool. RN notified

## 2022-04-29 NOTE — ED Notes (Addendum)
Pt had another BM, this tech and akia, NT changed pt brief, chux and bed linen. Pt had noted hematochezia within the stool again, more than last time. RN notified.

## 2022-04-29 NOTE — ED Notes (Addendum)
Pt has been having multiple BM. Staff having to change linen each time as well. Son is in with pt at this time and walking out of the room to tell staff that pt keeps trying to get up, son states "he just keeps trying to get out of the bed and I'm scared he will get out of bed" no redirection has been attempted by son. Due to other pts on the unit, staff is not able to stay in pt room for long increments of time each time.

## 2022-04-29 NOTE — Assessment & Plan Note (Signed)
-   We will continue his Eliquis. 

## 2022-04-29 NOTE — Evaluation (Signed)
Physical Therapy Evaluation Patient Details Name: Jim Blake MRN: [id number] DOB: 1935-06-09 Today's Date: 04/29/2022  History of Present Illness  87 y.o. Caucasian male with medical history significant for atrial fibrillation, hypertension and vitamin B12 deficiency, presented to the emergency room with acute onset of cough over the last 4 days which has been occasionally productive of clear sputum without dyspnea or wheezing however with altered mental status with worsening confusion and disorientation per his family.  He did not have any reported fever or chills.  No dysuria, oliguria or hematuria or flank pain currently.  He was treated for a UTI before Christmas with p.o. Cipro.  He was significantly weak today he could not walk. He is positive for flu A.  Clinical Impression  Pt is a pleasantly confused 87 year old male who was admitted for flu and weakness. Pt performs bed mobility/transfers with min assist and unsafe to further attempt to walk at this time. Pt is very confused and only alert to self. Continuously trying to get OOB and use bathroom. Performed tolieting twice during session with blood in stool noted. Secure chat sent to care team. Pt demonstrates deficits with strength/mobility/cognition. Currently not at baseline level. Would benefit from skilled PT to address above deficits and promote optimal return to PLOF; recommend transition to STR upon discharge from acute hospitalization.      Recommendations for follow up therapy are one component of a multi-disciplinary discharge planning process, led by the attending physician.  Recommendations may be updated based on patient status, additional functional criteria and insurance authorization.  Follow Up Recommendations Skilled nursing-short term rehab (<3 hours/day) Can patient physically be transported by private vehicle: No    Assistance Recommended at Discharge Frequent or constant Supervision/Assistance  Patient can return home  with the following  Two people to help with walking and/or transfers;A lot of help with bathing/dressing/bathroom;Help with stairs or ramp for entrance    Equipment Recommendations  (TBD)  Recommendations for Other Services       Functional Status Assessment Patient has had a recent decline in their functional status and demonstrates the ability to make significant improvements in function in a reasonable and predictable amount of time.     Precautions / Restrictions Precautions Precautions: Fall Restrictions Weight Bearing Restrictions: No      Mobility  Bed Mobility Overal bed mobility: Needs Assistance Bed Mobility: Supine to Sit     Supine to sit: Min assist Sit to supine: Min assist   General bed mobility comments: needs assist for trunkal elevation. Once seated, poor seated balance with constant post lean noted.    Transfers Overall transfer level: Needs assistance Equipment used: Rolling walker (2 wheels) Transfers: Sit to/from Stand, Bed to chair/wheelchair/BSC Sit to Stand: Min assist           General transfer comment: once standing, poor standing balance. Pulls up on RW. randomly sits back down, needing +2 to maintain standing.    Ambulation/Gait               General Gait Details: unsafe to ambulate at this time  Stairs            Wheelchair Mobility    Modified Rankin (Stroke Patients Only)       Balance Overall balance assessment: Needs assistance Sitting-balance support: Feet supported Sitting balance-Leahy Scale: Good     Standing balance support: During functional activity, Single extremity supported, Bilateral upper extremity supported Standing balance-Leahy Scale: Poor  Pertinent Vitals/Pain Pain Assessment Pain Assessment: No/denies pain    Home Living Family/patient expects to be discharged to:: Private residence Living Arrangements: Spouse/significant other Available  Help at Discharge: Family;Available 24 hours/day (son comes over daily-lives nearby) Type of Home: House Home Access: Ramped entrance       Home Layout: One level Home Equipment: Conservation officer, nature (2 wheels)      Prior Function Prior Level of Function : Needs assist             Mobility Comments: furniture walking throughout home. Son reports history of falls. Resting tremor ADLs Comments: Pt's daughter reports some occasional assistance secondary to cognition for self care tasks. Family assists with IADLs.     Hand Dominance   Dominant Hand: Right    Extremity/Trunk Assessment   Upper Extremity Assessment Upper Extremity Assessment: Generalized weakness    Lower Extremity Assessment Lower Extremity Assessment: Generalized weakness;Difficult to assess due to impaired cognition       Communication   Communication: HOH  Cognition Arousal/Alertness: Awake/alert Behavior During Therapy: Restless, Flat affect Overall Cognitive Status: History of cognitive impairments - at baseline                                 General Comments: only alert to self. continuously thinks he is at home.        General Comments      Exercises Other Exercises Other Exercises: pt with incontient episode in standing. Blood in stool. Needs max assist for hygiene. Multiple attempts for redirection as pt consistently trying to get OOB.   Assessment/Plan    PT Assessment Patient needs continued PT services  PT Problem List Decreased strength;Decreased balance;Decreased mobility;Decreased activity tolerance;Decreased cognition;Decreased safety awareness       PT Treatment Interventions Gait training;Stair training;Therapeutic exercise;Balance training    PT Goals (Current goals can be found in the Care Plan section)  Acute Rehab PT Goals Patient Stated Goal: unable to state PT Goal Formulation: With family Time For Goal Achievement: 05/13/22 Potential to Achieve Goals:  Fair    Frequency Min 2X/week     Co-evaluation               AM-PAC PT "6 Clicks" Mobility  Outcome Measure Help needed turning from your back to your side while in a flat bed without using bedrails?: A Little Help needed moving from lying on your back to sitting on the side of a flat bed without using bedrails?: A Little Help needed moving to and from a bed to a chair (including a wheelchair)?: A Lot Help needed standing up from a chair using your arms (e.g., wheelchair or bedside chair)?: A Little Help needed to walk in hospital room?: A Lot Help needed climbing 3-5 steps with a railing? : A Lot 6 Click Score: 15    End of Session Equipment Utilized During Treatment: Gait belt Activity Tolerance:  (limited secondary to confusion) Patient left: in bed;with bed alarm set Nurse Communication: Mobility status PT Visit Diagnosis: Unsteadiness on feet (R26.81);Muscle weakness (generalized) (M62.81);History of falling (Z91.81);Difficulty in walking, not elsewhere classified (R26.2)    Time: 1337-1410 PT Time Calculation (min) (ACUTE ONLY): 33 min   Charges:   PT Evaluation $PT Eval Low Complexity: 1 Low PT Treatments $Therapeutic Activity: 8-22 mins        Greggory Stallion, PT, DPT, GCS [phone]   Candida Vetter 04/29/2022, 2:33 PM

## 2022-04-29 NOTE — ED Notes (Signed)
Pt noted to be attempting to climb out of bed on multiple occasions over past hour. Bed alarm in place, activated, and audible, however pt still managing to get legs over side of bed and semi-stand before staff arrives to room. When attempting to redirect and place pt back in bed, pt will lunge forward at staff.

## 2022-04-29 NOTE — ED Notes (Signed)
Pt soiled bed of urine. This tech did a changed all the bed linen. Patient slid up in bed and bed alarm on.

## 2022-04-29 NOTE — Progress Notes (Addendum)
PROGRESS NOTE    Jim Blake  UXL:[id number] DOB: February 04, 1936 DOA: 04/29/2022 PCP: Juluis Pitch, MD    Brief Narrative:   Jim Blake is a 87 y.o. male with past medical history significant for paroxysmal atrial fibrillation, essential hypertension, B12 deficiency, dementia, depression/anxiety who presented to [facility] ED on 1/1 via EMS from home with confusion, cough over the last 4 days.  Recently diagnosed with urinary tract infection 8 days prior to admission treated with oral ciprofloxacin.  Patient became progressively more weak with inability to walk due to his progressive symptoms.  In the ED, temperature 99.2 F, HR 84, RR 22, BP 138/63, SpO2 78% on room air.  WBC 8.0, hemoglobin 13.1, platelets 237.  Sodium 134, potassium 3.9, chloride 97, CO2 28, glucose 104, BUN 20, creatinine 1.20.  AST 23, ALT 15, total bilirubin 0.7.  Lactic acid 1.4.  Influenza A PCR positive.  COVID-19 PCR negative.  RSV PCR negative.  Urinalysis unrevealing.  EKG with atrial fibrillation, rate controlled.  Chest x-ray with no acute cardiopulmonary disease process.  Blood cultures x 2 obtained.  TRH was consulted for admission for further evaluation management of acute hypoxic respite failure secondary to influenza A viral infection with associated generalized weakness.  Assessment & Plan:   Acute hypoxic respiratory failure, POA Influenza A viral infection Patient presenting to the ED with progressive weakness, fatigue and cough over the previous 4 days.  Patient is afebrile without leukocytosis.  Chest x-ray with no acute cardiopulmonary disease process.  Influenza A viral PCR positive.  COVID/RSV negative. -- Tamiflu 30 mg p.o. twice daily x 5 days  Paroxysmal atrial fibrillation Rate controlled, not on any rate controlling medications at home. -- Continue Eliquis 5 mg p.o. twice daily  Diarrhea Patient with recent exposure to ciprofloxacin outpatient.  Patient with multiple BMs on presentation, although  afebrile without leukocytosis. -- Check GI PCR and C. difficile PCR panel -- Enteric precautions  Essential hypertension -- Lisinopril-HCTZ 20-12.5 mg p.o. twice daily.  Dementia --Delirium precautions --Get up during the day --Encourage a familiar face to remain present throughout the day --Keep blinds open and lights on during daylight hours --Minimize the use of opioids/benzodiazepines --Benazepril 10 mg p.o. nightly --Melatonin 3 mg p.o. nightly  Anxiety/depression: -- Sertraline 100 mg p.o. daily  Generalized weakness/fatigue/deconditioning:  Patient presenting from home with progressive weakness in the setting of acute viral influenza as above. --PT/OT evaluation: Pending   DVT prophylaxis:  apixaban (ELIQUIS) tablet 5 mg    Code Status: Full Code Family Communication: No family present at bedside this morning  Disposition Plan:  Level of care: Med-Surg Status is: Observation The patient remains OBS appropriate and will d/c before 2 midnights.    Consultants:  None  Procedures:  None  Antimicrobials:  Tamiflu   Subjective: Patient seen examined bedside, resting calmly.  Lying in bed.  Pleasantly confused.  No family present.  No specific complaints this morning.  Denies shortness of breath, no chest pain, no abdominal pain.  Unable to pain any further ROS due to his underlying dementia/mental status.  No acute concerns per nursing staff overnight.  Objective: Vitals:   04/29/22 0120 04/29/22 0300 04/29/22 0600 04/29/22 1229  BP: 98/72 132/76 137/74 130/70  Pulse: (!) 58 68 66 60  Resp: 20 19 19 18   Temp: 97.8 F (36.6 C) 98 F (36.7 C) 98.2 F (36.8 C)   TempSrc: Axillary     SpO2: 95% 96% 96% 96%  Weight:  Height:       No intake or output data in the 24 hours ending 04/29/22 1254 Filed Weights   04/28/22 1949  Weight: 86.2 kg    Examination:  Physical Exam: GEN: NAD, alert, pleasantly confused, chronically ill in appearance, appears  older than stated age HEENT: NCAT, PERRL, EOMI, sclera clear, dry mucous membranes, poor dentition PULM: CTAB w/o wheezes/crackles, normal respiratory effort, on room air CV: RRR w/o M/G/R GI: abd soft, NTND, NABS, no R/G/M MSK: no peripheral edema, moves all extremities independently NEURO: No focal deficits PSYCH: normal mood/affect Integumentary: dry/intact, no rashes or wounds    Data Reviewed: I have personally reviewed following labs and imaging studies  CBC: Recent Labs  Lab 04/28/22 1951  WBC 8.0  NEUTROABS 5.7  HGB 13.1  HCT 40.7  MCV 83.7  PLT 237   Basic Metabolic Panel: Recent Labs  Lab 04/28/22 1951  NA 134*  K 3.9  CL 97*  CO2 28  GLUCOSE 104*  BUN 20  CREATININE 1.20  CALCIUM 8.6*   GFR: Estimated Creatinine Clearance: 50.3 mL/min (by C-G formula based on SCr of 1.2 mg/dL). Liver Function Tests: Recent Labs  Lab 04/28/22 1951  AST 23  ALT 15  ALKPHOS 73  BILITOT 0.7  PROT 7.0  ALBUMIN 3.3*   No results for input(s): "LIPASE", "AMYLASE" in the last 168 hours. No results for input(s): "AMMONIA" in the last 168 hours. Coagulation Profile: Recent Labs  Lab 04/28/22 1951  INR 1.5*   Cardiac Enzymes: No results for input(s): "CKTOTAL", "CKMB", "CKMBINDEX", "TROPONINI" in the last 168 hours. BNP (last 3 results) No results for input(s): "PROBNP" in the last 8760 hours. HbA1C: No results for input(s): "HGBA1C" in the last 72 hours. CBG: No results for input(s): "GLUCAP" in the last 168 hours. Lipid Profile: No results for input(s): "CHOL", "HDL", "LDLCALC", "TRIG", "CHOLHDL", "LDLDIRECT" in the last 72 hours. Thyroid Function Tests: No results for input(s): "TSH", "T4TOTAL", "FREET4", "T3FREE", "THYROIDAB" in the last 72 hours. Anemia Panel: No results for input(s): "VITAMINB12", "FOLATE", "FERRITIN", "TIBC", "IRON", "RETICCTPCT" in the last 72 hours. Sepsis Labs: Recent Labs  Lab 04/28/22 2027  LATICACIDVEN 1.4    Recent Results  (from the past 240 hour(s))  Culture, blood (Routine x 2)     Status: None (Preliminary result)   Collection Time: 04/28/22  8:27 PM   Specimen: BLOOD  Result Value Ref Range Status   Specimen Description BLOOD LEFT ANTECUBITAL  Final   Special Requests   Final    BOTTLES DRAWN AEROBIC AND ANAEROBIC Blood Culture adequate volume   Culture   Final    NO GROWTH < 12 HOURS Performed at [facility], [address], [place], [place] [address]    Report Status PENDING  Incomplete  Resp panel by RT-PCR (RSV, Flu A&B, Covid) Anterior Nasal Swab     Status: Abnormal   Collection Time: 04/28/22  8:27 PM   Specimen: Anterior Nasal Swab  Result Value Ref Range Status   SARS Coronavirus 2 by RT PCR NEGATIVE NEGATIVE Final    Comment: (NOTE) SARS-CoV-2 target nucleic acids are NOT DETECTED.  The SARS-CoV-2 RNA is generally detectable in upper respiratory specimens during the acute phase of infection. The lowest concentration of SARS-CoV-2 viral copies this assay can detect is 138 copies/mL. A negative result does not preclude SARS-Cov-2 infection and should not be used as the sole basis for treatment or other patient management decisions. A negative result may occur with  improper specimen collection/handling, submission of specimen other than nasopharyngeal swab, presence of viral mutation(s) within the areas targeted by this assay, and inadequate number of viral copies(<138 copies/mL). A negative result must be combined with clinical observations, patient history, and epidemiological information. The expected result is Negative.  Fact Sheet for Patients:  BloggerCourse.com  Fact Sheet for Healthcare Providers:  SeriousBroker.it  This test is no t yet approved or cleared by the [place] FDA and  has been authorized for detection and/or diagnosis of SARS-CoV-2 by FDA under an Emergency Use Authorization (EUA). This EUA will  remain  in effect (meaning this test can be used) for the duration of the COVID-19 declaration under Section 564(b)(1) of the Act, 21 U.S.C.section 360bbb-3(b)(1), unless the authorization is terminated  or revoked sooner.       Influenza A by PCR POSITIVE (A) NEGATIVE Final   Influenza B by PCR NEGATIVE NEGATIVE Final    Comment: (NOTE) The Xpert Xpress SARS-CoV-2/FLU/RSV plus assay is intended as an aid in the diagnosis of influenza from Nasopharyngeal swab specimens and should not be used as a sole basis for treatment. Nasal washings and aspirates are unacceptable for Xpert Xpress SARS-CoV-2/FLU/RSV testing.  Fact Sheet for Patients: BloggerCourse.com  Fact Sheet for Healthcare Providers: SeriousBroker.it  This test is not yet approved or cleared by the [place] FDA and has been authorized for detection and/or diagnosis of SARS-CoV-2 by FDA under an Emergency Use Authorization (EUA). This EUA will remain in effect (meaning this test can be used) for the duration of the COVID-19 declaration under Section 564(b)(1) of the Act, 21 U.S.C. section 360bbb-3(b)(1), unless the authorization is terminated or revoked.     Resp Syncytial Virus by PCR NEGATIVE NEGATIVE Final    Comment: (NOTE) Fact Sheet for Patients: BloggerCourse.com  Fact Sheet for Healthcare Providers: SeriousBroker.it  This test is not yet approved or cleared by the [place] FDA and has been authorized for detection and/or diagnosis of SARS-CoV-2 by FDA under an Emergency Use Authorization (EUA). This EUA will remain in effect (meaning this test can be used) for the duration of the COVID-19 declaration under Section 564(b)(1) of the Act, 21 U.S.C. section 360bbb-3(b)(1), unless the authorization is terminated or revoked.  Performed at [facility], [address] [place], [place]  [address]   Culture, blood (Routine x 2)     Status: None (Preliminary result)   Collection Time: 04/29/22  3:53 AM   Specimen: BLOOD  Result Value Ref Range Status   Specimen Description BLOOD RIGHT FA  Final   Special Requests   Final    BOTTLES DRAWN AEROBIC AND ANAEROBIC Blood Culture results may not be optimal due to an excessive volume of blood received in culture bottles   Culture   Final    NO GROWTH < 12 HOURS Performed at [facility], [address] [place], [place] [address]    Report Status PENDING  Incomplete         Radiology Studies: DG Chest Port 1 View  Result Date: 04/28/2022 CLINICAL DATA:  Altered mental status EXAM: PORTABLE CHEST 1 VIEW COMPARISON:  10/10/2020 FINDINGS: Transverse diameter of heart is increased. There is poor inspiration. There are no signs of alveolar pulmonary edema or focal pulmonary consolidation. There is no pleural effusion or pneumothorax. Degenerative changes are noted in both shoulders, more so on the right side. IMPRESSION: There are no signs of pulmonary edema or focal pulmonary consolidation. Poor inspiration. Electronically Signed  By: Elmer Picker M.D.   On: 04/28/2022 21:03        Scheduled Meds:  apixaban  5 mg Oral BID   donepezil  10 mg Oral QHS   lisinopril  20 mg Oral BID   And   hydrochlorothiazide  12.5 mg Oral BID   multivitamin with minerals  1 tablet Oral Daily   oseltamivir  30 mg Oral BID   potassium chloride SA  20 mEq Oral Daily   sertraline  100 mg Oral Daily   Continuous Infusions:  sodium chloride 100 mL/hr at 04/29/22 0851     LOS: 0 days    Time spent: 52 minutes spent on chart review, discussion with nursing staff, consultants, updating family and interview/physical exam; more than 50% of that time was spent in counseling and/or coordination of care.    Tunis Gentle J [place] DO [facility] Available via Epic secure chat 7am-7pm After these hours, please refer to coverage  provider listed on [email or web address] 04/29/2022, 12:54 PM

## 2022-04-30 ENCOUNTER — Encounter: Payer: Self-pay | Admitting: Family Medicine

## 2022-04-30 DIAGNOSIS — J101 Influenza due to other identified influenza virus with other respiratory manifestations: Secondary | ICD-10-CM | POA: Diagnosis not present

## 2022-04-30 LAB — GASTROINTESTINAL PANEL BY PCR, STOOL (REPLACES STOOL CULTURE)

## 2022-04-30 LAB — CLOSTRIDIUM DIFFICILE BY PCR, REFLEXED: Toxigenic C. Difficile by PCR: NEGATIVE

## 2022-04-30 LAB — CBC
HCT: 40.8 % (ref 39.0–52.0)
Hemoglobin: 13.3 g/dL (ref 13.0–17.0)
MCH: 27 pg (ref 26.0–34.0)
MCHC: 32.6 g/dL (ref 30.0–36.0)
MCV: 82.9 fL (ref 80.0–100.0)
Platelets: 209 10*3/uL (ref 150–400)
RBC: 4.92 MIL/uL (ref 4.22–5.81)
RDW: 13.8 % (ref 11.5–15.5)
WBC: 5.6 10*3/uL (ref 4.0–10.5)
nRBC: 0 % (ref 0.0–0.2)

## 2022-04-30 LAB — BASIC METABOLIC PANEL
Anion gap: 4 — ABNORMAL LOW (ref 5–15)
BUN: 29 mg/dL — ABNORMAL HIGH (ref 8–23)
CO2: 27 mmol/L (ref 22–32)
Calcium: 8.6 mg/dL — ABNORMAL LOW (ref 8.9–10.3)
Chloride: 105 mmol/L (ref 98–111)
Creatinine, Ser: 1.47 mg/dL — ABNORMAL HIGH (ref 0.61–1.24)
GFR, Estimated: 47 mL/min — ABNORMAL LOW (ref >60)
Glucose, Bld: 87 mg/dL (ref 70–99)
Potassium: 3.4 mmol/L — ABNORMAL LOW (ref 3.5–5.1)
Sodium: 136 mmol/L (ref 135–145)

## 2022-04-30 LAB — C DIFFICILE QUICK SCREEN W PCR REFLEX
C Diff antigen: POSITIVE — AB
C Diff toxin: NEGATIVE

## 2022-04-30 LAB — BRAIN NATRIURETIC PEPTIDE: B Natriuretic Peptide: 316.1 pg/mL — ABNORMAL HIGH (ref 0.0–100.0)

## 2022-04-30 LAB — PROCALCITONIN: Procalcitonin: <0.1 ng/mL

## 2022-04-30 MED ORDER — TRAZODONE HCL 50 MG PO TABS
50.0000 mg | ORAL_TABLET | Freq: Every evening | ORAL | Status: DC | PRN
  Administered 2022-05-04: 50 mg via ORAL
  Filled 2022-04-30: qty 1

## 2022-04-30 MED ORDER — FUROSEMIDE 10 MG/ML IJ SOLN
20.0000 mg | Freq: Once | INTRAMUSCULAR | Status: AC
  Administered 2022-04-30: 20 mg via INTRAVENOUS
  Filled 2022-04-30: qty 4

## 2022-04-30 MED ORDER — POTASSIUM CHLORIDE CRYS ER 20 MEQ PO TBCR
40.0000 meq | EXTENDED_RELEASE_TABLET | Freq: Once | ORAL | Status: AC
  Administered 2022-04-30: 40 meq via ORAL
  Filled 2022-04-30: qty 2

## 2022-04-30 MED ORDER — HYDRALAZINE HCL 20 MG/ML IJ SOLN: 10.0000 mg | INTRAMUSCULAR | Status: DC | PRN

## 2022-04-30 MED ORDER — IPRATROPIUM-ALBUTEROL 0.5-2.5 (3) MG/3ML IN SOLN: 3.0000 mL | RESPIRATORY_TRACT | Status: DC | PRN

## 2022-04-30 MED ORDER — METOPROLOL TARTRATE 5 MG/5ML IV SOLN: 5.0000 mg | INTRAVENOUS | Status: DC | PRN

## 2022-04-30 MED ORDER — GUAIFENESIN 100 MG/5ML PO LIQD: 5.0000 mL | ORAL | Status: DC | PRN

## 2022-04-30 MED ORDER — SENNOSIDES-DOCUSATE SODIUM 8.6-50 MG PO TABS: 1.0000 | ORAL_TABLET | Freq: Every evening | ORAL | Status: DC | PRN

## 2022-04-30 NOTE — ED Notes (Signed)
Bed alarm was alarming. Went to check on patient and he was turning in the bed. Patient removed gown and threw the blanket on the ground stated he had to pee. Patient had already wet himself. Changed patient's brief and provided peri care. Reoriented patient that he was in the hospital and to use the call light. Patient stated he did not know he was in the hospital. Placed patient back in gown and placed blanket back over patient and handed him the call light. Bed alarm reactivated.

## 2022-04-30 NOTE — Progress Notes (Signed)
PROGRESS NOTE    Jim Blake  YQI:[id number] DOB: January 10, 1936 DOA: 04/29/2022 PCP: Juluis Pitch, MD   Brief Narrative:   87 y.o. male with past medical history significant for paroxysmal atrial fibrillation, essential hypertension, B12 deficiency, dementia, depression/anxiety who presented to [facility] ED on 1/1 via EMS from home with confusion, cough over the last 4 days.  Recently diagnosed with urinary tract infection 8 days prior to admission treated with oral ciprofloxacin.  Patient became progressively more weak with inability to walk due to his progressive symptoms. Influenza A PCR positive. COVID-19 PCR negative. RSV PCR negative. Urinalysis unrevealing. EKG with atrial fibrillation, rate controlled. Chest x-ray with no acute cardiopulmonary disease process. Blood cultures x 2 obtained.    Assessment & Plan:  Principal Problem:   Influenza A Active Problems:   Essential hypertension   Paroxysmal atrial fibrillation (HCC)   Dementia with behavioral disturbance (HCC)   Depression   Acute encephalopathy   Generalized weakness    Acute hypoxic respiratory failure, POA Influenza A viral infection Patient presenting to the ED with progressive weakness, fatigue and cough over the previous 4 days.  Patient is afebrile without leukocytosis.  Chest x-ray with no acute cardiopulmonary disease process.  Influenza A viral PCR positive.  COVID/RSV negative. -- Tamiflu 30 mg p.o. twice daily x 5 days, last day 1/6   Paroxysmal atrial fibrillation Rate controlled, not on any rate controlling medications at home. -- Eliquis twice daily   Diarrhea Patient with recent exposure to ciprofloxacin outpatient.  Patient with multiple BMs on presentation, although afebrile without leukocytosis. -- Check GI PCR and C. difficile PCR panel-pending collection -- Enteric precautions   Essential hypertension -- Lisinopril-HCTZ 20-12.5 mg p.o. twice daily.  IV as needed ordered   Dementia --Delirium  precautions.  Continue home Aricept and Zoloft   Anxiety/depression: -- Sertraline 100 mg p.o. daily  Hypokalemia - Repletion   Generalized weakness/fatigue/deconditioning:  Patient presenting from home with progressive weakness in the setting of acute viral influenza as above. --PT/OT evaluation: SNF     DVT prophylaxis:  apixaban (ELIQUIS) tablet 5 mg    Code Status: Full Code Family Communication: Wife called, she didn't answer. Spoke with son.    Disposition Plan:  Patient will likely need SNF placement   Consultants:  None   Procedures:  None   Antimicrobials:  Tamiflu    Subjective:  Still coughing quite a bit. Pleasantly confused  Examination:  General exam: Appears calm and comfortable  Respiratory system: Clear to auscultation. Respiratory effort normal. Cardiovascular system: S1 & S2 heard, RRR. No JVD, murmurs, rubs, gallops or clicks. No pedal edema. Gastrointestinal system: Abdomen is nondistended, soft and nontender. No organomegaly or masses felt. Normal bowel sounds heard. Central nervous system: Alert and oriented. No focal neurological deficits. Extremities: Symmetric 5 x 5 power. Skin: No rashes, lesions or ulcers Psychiatry: Judgement and insight appear poor    Objective: Vitals:   04/30/22 0240 04/30/22 0400 04/30/22 0600 04/30/22 0801  BP: (!) 149/62 (!) 166/76 (!) 154/82   Pulse: 60 66 (!) 57   Resp: 17 16 17    Temp: 98.2 F (36.8 C) 97.9 F (36.6 C)  98.1 F (36.7 C)  TempSrc: Oral Oral  Oral  SpO2: 97% 94% 97%   Weight:      Height:       No intake or output data in the 24 hours ending 04/30/22 0903 Filed Weights   04/28/22 1949  Weight: 86.2 kg  Data Reviewed:   CBC: Recent Labs  Lab 04/28/22 1951 04/30/22 0557  WBC 8.0 5.6  NEUTROABS 5.7  --   HGB 13.1 13.3  HCT 40.7 40.8  MCV 83.7 82.9  PLT 237 [id number]   Basic Metabolic Panel: Recent Labs  Lab 04/28/22 1951 04/30/22 0557  NA 134* 136  K 3.9 3.4*  CL  97* 105  CO2 28 27  GLUCOSE 104* 87  BUN 20 29*  CREATININE 1.20 1.47*  CALCIUM 8.6* 8.6*   GFR: Estimated Creatinine Clearance: 41.1 mL/min (A) (by C-G formula based on SCr of 1.47 mg/dL (H)). Liver Function Tests: Recent Labs  Lab 04/28/22 1951  AST 23  ALT 15  ALKPHOS 73  BILITOT 0.7  PROT 7.0  ALBUMIN 3.3*   No results for input(s): "LIPASE", "AMYLASE" in the last 168 hours. No results for input(s): "AMMONIA" in the last 168 hours. Coagulation Profile: Recent Labs  Lab 04/28/22 1951  INR 1.5*   Cardiac Enzymes: No results for input(s): "CKTOTAL", "CKMB", "CKMBINDEX", "TROPONINI" in the last 168 hours. BNP (last 3 results) No results for input(s): "PROBNP" in the last 8760 hours. HbA1C: No results for input(s): "HGBA1C" in the last 72 hours. CBG: No results for input(s): "GLUCAP" in the last 168 hours. Lipid Profile: No results for input(s): "CHOL", "HDL", "LDLCALC", "TRIG", "CHOLHDL", "LDLDIRECT" in the last 72 hours. Thyroid Function Tests: No results for input(s): "TSH", "T4TOTAL", "FREET4", "T3FREE", "THYROIDAB" in the last 72 hours. Anemia Panel: No results for input(s): "VITAMINB12", "FOLATE", "FERRITIN", "TIBC", "IRON", "RETICCTPCT" in the last 72 hours. Sepsis Labs: Recent Labs  Lab 04/28/22 2027  LATICACIDVEN 1.4    Recent Results (from the past 240 hour(s))  Culture, blood (Routine x 2)     Status: None (Preliminary result)   Collection Time: 04/28/22  8:27 PM   Specimen: BLOOD  Result Value Ref Range Status   Specimen Description BLOOD LEFT ANTECUBITAL  Final   Special Requests   Final    BOTTLES DRAWN AEROBIC AND ANAEROBIC Blood Culture adequate volume   Culture   Final    NO GROWTH 2 DAYS Performed at [facility], [address] [place], [place] [address]    Report Status PENDING  Incomplete  Resp panel by RT-PCR (RSV, Flu A&B, Covid) Anterior Nasal Swab     Status: Abnormal   Collection Time: 04/28/22  8:27 PM   Specimen:  Anterior Nasal Swab  Result Value Ref Range Status   SARS Coronavirus 2 by RT PCR NEGATIVE NEGATIVE Final    Comment: (NOTE) SARS-CoV-2 target nucleic acids are NOT DETECTED.  The SARS-CoV-2 RNA is generally detectable in upper respiratory specimens during the acute phase of infection. The lowest concentration of SARS-CoV-2 viral copies this assay can detect is 138 copies/mL. A negative result does not preclude SARS-Cov-2 infection and should not be used as the sole basis for treatment or other patient management decisions. A negative result may occur with  improper specimen collection/handling, submission of specimen other than nasopharyngeal swab, presence of viral mutation(s) within the areas targeted by this assay, and inadequate number of viral copies(<138 copies/mL). A negative result must be combined with clinical observations, patient history, and epidemiological information. The expected result is Negative.  Fact Sheet for Patients:  EntrepreneurPulse.com.au  Fact Sheet for Healthcare Providers:  IncredibleEmployment.be  This test is no t yet approved or cleared by the [place] FDA and  has been authorized for detection and/or diagnosis of SARS-CoV-2 by FDA under an Emergency  Use Authorization (EUA). This EUA will remain  in effect (meaning this test can be used) for the duration of the COVID-19 declaration under Section 564(b)(1) of the Act, 21 U.S.C.section 360bbb-3(b)(1), unless the authorization is terminated  or revoked sooner.       Influenza A by PCR POSITIVE (A) NEGATIVE Final   Influenza B by PCR NEGATIVE NEGATIVE Final    Comment: (NOTE) The Xpert Xpress SARS-CoV-2/FLU/RSV plus assay is intended as an aid in the diagnosis of influenza from Nasopharyngeal swab specimens and should not be used as a sole basis for treatment. Nasal washings and aspirates are unacceptable for Xpert Xpress  SARS-CoV-2/FLU/RSV testing.  Fact Sheet for Patients: EntrepreneurPulse.com.au  Fact Sheet for Healthcare Providers: IncredibleEmployment.be  This test is not yet approved or cleared by the [place] FDA and has been authorized for detection and/or diagnosis of SARS-CoV-2 by FDA under an Emergency Use Authorization (EUA). This EUA will remain in effect (meaning this test can be used) for the duration of the COVID-19 declaration under Section 564(b)(1) of the Act, 21 U.S.C. section 360bbb-3(b)(1), unless the authorization is terminated or revoked.     Resp Syncytial Virus by PCR NEGATIVE NEGATIVE Final    Comment: (NOTE) Fact Sheet for Patients: EntrepreneurPulse.com.au  Fact Sheet for Healthcare Providers: IncredibleEmployment.be  This test is not yet approved or cleared by the [place] FDA and has been authorized for detection and/or diagnosis of SARS-CoV-2 by FDA under an Emergency Use Authorization (EUA). This EUA will remain in effect (meaning this test can be used) for the duration of the COVID-19 declaration under Section 564(b)(1) of the Act, 21 U.S.C. section 360bbb-3(b)(1), unless the authorization is terminated or revoked.  Performed at [facility], [address] [place], [place] [address]   Culture, blood (Routine x 2)     Status: None (Preliminary result)   Collection Time: 04/29/22  3:53 AM   Specimen: BLOOD  Result Value Ref Range Status   Specimen Description BLOOD RIGHT FA  Final   Special Requests   Final    BOTTLES DRAWN AEROBIC AND ANAEROBIC Blood Culture results may not be optimal due to an excessive volume of blood received in culture bottles   Culture   Final    NO GROWTH 1 DAY Performed at [facility], [address], [place], [place] [address]    Report Status PENDING  Incomplete         Radiology Studies: DG Chest Port 1 View  Result  Date: 04/28/2022 CLINICAL DATA:  Altered mental status EXAM: PORTABLE CHEST 1 VIEW COMPARISON:  10/10/2020 FINDINGS: Transverse diameter of heart is increased. There is poor inspiration. There are no signs of alveolar pulmonary edema or focal pulmonary consolidation. There is no pleural effusion or pneumothorax. Degenerative changes are noted in both shoulders, more so on the right side. IMPRESSION: There are no signs of pulmonary edema or focal pulmonary consolidation. Poor inspiration. Electronically Signed   By: Elmer Picker M.D.   On: 04/28/2022 21:03        Scheduled Meds:  apixaban  5 mg Oral BID   donepezil  10 mg Oral QHS   lisinopril  20 mg Oral BID   And   hydrochlorothiazide  12.5 mg Oral BID   multivitamin with minerals  1 tablet Oral Daily   oseltamivir  30 mg Oral BID   potassium chloride SA  20 mEq Oral Daily   sertraline  100 mg Oral Daily   Continuous Infusions:  sodium chloride 75 mL/hr (04/29/22 1314)     LOS: 0 days   Time spent= 35 mins    Suzanna Zahn Arsenio Loader, MD [facility]  If 7PM-7AM, please contact night-coverage  04/30/2022, 9:03 AM

## 2022-04-30 NOTE — ED Notes (Signed)
Dr. Amin, MD at bedside at this time.  

## 2022-04-30 NOTE — ED Notes (Signed)
Pt given a breakfast tray.

## 2022-05-01 DIAGNOSIS — J9601 Acute respiratory failure with hypoxia: Secondary | ICD-10-CM | POA: Diagnosis present

## 2022-05-01 DIAGNOSIS — F0394 Unspecified dementia, unspecified severity, with anxiety: Secondary | ICD-10-CM | POA: Diagnosis present

## 2022-05-01 DIAGNOSIS — Z79899 Other long term (current) drug therapy: Secondary | ICD-10-CM | POA: Diagnosis not present

## 2022-05-01 DIAGNOSIS — G9341 Metabolic encephalopathy: Secondary | ICD-10-CM | POA: Diagnosis present

## 2022-05-01 DIAGNOSIS — F03918 Unspecified dementia, unspecified severity, with other behavioral disturbance: Secondary | ICD-10-CM | POA: Diagnosis present

## 2022-05-01 DIAGNOSIS — I480 Paroxysmal atrial fibrillation: Secondary | ICD-10-CM | POA: Diagnosis present

## 2022-05-01 DIAGNOSIS — E871 Hypo-osmolality and hyponatremia: Secondary | ICD-10-CM | POA: Diagnosis present

## 2022-05-01 DIAGNOSIS — E878 Other disorders of electrolyte and fluid balance, not elsewhere classified: Secondary | ICD-10-CM | POA: Diagnosis present

## 2022-05-01 DIAGNOSIS — F0393 Unspecified dementia, unspecified severity, with mood disturbance: Secondary | ICD-10-CM | POA: Diagnosis present

## 2022-05-01 DIAGNOSIS — F32A Depression, unspecified: Secondary | ICD-10-CM | POA: Diagnosis present

## 2022-05-01 DIAGNOSIS — Z1152 Encounter for screening for COVID-19: Secondary | ICD-10-CM | POA: Diagnosis not present

## 2022-05-01 DIAGNOSIS — E538 Deficiency of other specified B group vitamins: Secondary | ICD-10-CM | POA: Diagnosis present

## 2022-05-01 DIAGNOSIS — E876 Hypokalemia: Secondary | ICD-10-CM | POA: Diagnosis present

## 2022-05-01 DIAGNOSIS — Z7901 Long term (current) use of anticoagulants: Secondary | ICD-10-CM | POA: Diagnosis not present

## 2022-05-01 DIAGNOSIS — J101 Influenza due to other identified influenza virus with other respiratory manifestations: Secondary | ICD-10-CM | POA: Diagnosis present

## 2022-05-01 DIAGNOSIS — A0811 Acute gastroenteropathy due to Norwalk agent: Secondary | ICD-10-CM | POA: Diagnosis present

## 2022-05-01 DIAGNOSIS — I10 Essential (primary) hypertension: Secondary | ICD-10-CM | POA: Diagnosis present

## 2022-05-01 DIAGNOSIS — F17290 Nicotine dependence, other tobacco product, uncomplicated: Secondary | ICD-10-CM | POA: Diagnosis present

## 2022-05-01 LAB — CBC
HCT: 41.3 % (ref 39.0–52.0)
Hemoglobin: 13.4 g/dL (ref 13.0–17.0)
MCH: 26.9 pg (ref 26.0–34.0)
MCHC: 32.4 g/dL (ref 30.0–36.0)
MCV: 82.8 fL (ref 80.0–100.0)
Platelets: 241 10*3/uL (ref 150–400)
RBC: 4.99 MIL/uL (ref 4.22–5.81)
RDW: 13.9 % (ref 11.5–15.5)
WBC: 6.7 10*3/uL (ref 4.0–10.5)
nRBC: 0 % (ref 0.0–0.2)

## 2022-05-01 LAB — BASIC METABOLIC PANEL
Anion gap: 10 (ref 5–15)
BUN: 33 mg/dL — ABNORMAL HIGH (ref 8–23)
CO2: 28 mmol/L (ref 22–32)
Calcium: 8.9 mg/dL (ref 8.9–10.3)
Chloride: 103 mmol/L (ref 98–111)
Creatinine, Ser: 1.52 mg/dL — ABNORMAL HIGH (ref 0.61–1.24)
GFR, Estimated: 45 mL/min — ABNORMAL LOW (ref >60)
Glucose, Bld: 94 mg/dL (ref 70–99)
Potassium: 3.6 mmol/L (ref 3.5–5.1)
Sodium: 141 mmol/L (ref 135–145)

## 2022-05-01 LAB — MAGNESIUM: Magnesium: 2.3 mg/dL (ref 1.7–2.4)

## 2022-05-01 MED ORDER — SODIUM CHLORIDE 0.9 % IV SOLN: INTRAVENOUS | Status: AC

## 2022-05-01 NOTE — Care Management Obs Status (Signed)
[facility] NOTIFICATION   Patient Details  Name: Jim Blake MRN: [id number] Date of Birth: [birth year]   Medicare Observation Status Notification Given:  Yes    Marene Gilliam, LCSW 05/01/2022, 9:48 AM

## 2022-05-01 NOTE — Progress Notes (Signed)
Occupational Therapy Treatment Patient Details Name: Jim Blake MRN: [id number] DOB: May 16, 1935 Today's Date: 05/01/2022   History of present illness 87 y.o. Caucasian male with medical history significant for atrial fibrillation, hypertension and vitamin B12 deficiency, presented to the emergency room with acute onset of cough over the last 4 days which has been occasionally productive of clear sputum without dyspnea or wheezing however with altered mental status with worsening confusion and disorientation per his family.  He did not have any reported fever or chills.  No dysuria, oliguria or hematuria or flank pain currently.  He was treated for a UTI before Christmas with p.o. Cipro.  He was significantly weak today he could not walk. He is positive for flu A.   OT comments  [name] was seen for OT treatment on this date. Pt presents to OT services oriented to self only with no family at bedside this PM. Pt seated in recliner, pleasant t/o session but generally fatigued and declines to participate in much functional activity. He is able to engage in STS t/f attempts x2 with MIN A from room recliner with multimodal cueing for safety and sequencing. He is noted to have a full lunch tray at start of session but does not initiate self-feeding when asked. With cueing and set up assist pt is able to eat multiple meal tray items including his dinner roll and banana. He declines further functional activity at this time and left with all needs met, call bell in reach, and chair alarm set. Pt continues to demonstrate impaired independence with self-care skills 2/2 impaired cognition, decreased activity tolerance, and decreased safety awareness. Pt is progressing toward goals and continues to benefit from skilled OT services to maximize return to PLOF and minimize risk of future falls, injury, caregiver burden, and readmission. Will continue to follow POC. Discharge recommendation updated to STR to maximize pt safety  and return to PLOF.      Recommendations for follow up therapy are one component of a multi-disciplinary discharge planning process, led by the attending physician.  Recommendations may be updated based on patient status, additional functional criteria and insurance authorization.    Follow Up Recommendations  Skilled nursing-short term rehab (<3 hours/day)     Assistance Recommended at Discharge Frequent or constant Supervision/Assistance  Patient can return home with the following  A little help with walking and/or transfers;A little help with bathing/dressing/bathroom;Help with stairs or ramp for entrance;Assistance with feeding;Assistance with cooking/housework;Assist for transportation;Direct supervision/assist for financial management;Direct supervision/assist for medications management   Equipment Recommendations  None recommended by OT    Recommendations for Other Services      Precautions / Restrictions Precautions Precautions: Fall Restrictions Weight Bearing Restrictions: No       Mobility Bed Mobility               General bed mobility comments: deferred. Pt in recliner at start/end of session.    Transfers Overall transfer level: Needs assistance Equipment used: Rolling walker (2 wheels) Transfers: Sit to/from Stand, Bed to chair/wheelchair/BSC Sit to Stand: Min assist           General transfer comment: cues for sequencing. Able to stand with upright posture. Appears to fatigue quickly and sits back down shortly after standing. Able to STS x2 during session.     Balance Overall balance assessment: Needs assistance Sitting-balance support: Feet supported Sitting balance-Leahy Scale: Good Sitting balance - Comments: steady sitting, reaching within BOS.   Standing balance support: During functional activity, Single  extremity supported, Bilateral upper extremity supported Standing balance-Leahy Scale: Fair                              ADL either performed or assessed with clinical judgement   ADL Overall ADL's : Needs assistance/impaired Eating/Feeding: Set up;Supervision/ safety;Cueing for sequencing Eating/Feeding Details (indicate cue type and reason): Cueing for initiation and continuation of self-feeding tasks. Pt recieved with lunch tray completely untouched but eats well when cued. may require assistance from hospital staff for feeding.                                 Functional mobility during ADLs: Minimal assistance;Rolling walker (2 wheels)      Extremity/Trunk Assessment Upper Extremity Assessment Upper Extremity Assessment: Generalized weakness   Lower Extremity Assessment Lower Extremity Assessment: Generalized weakness        Vision Patient Visual Report: No change from baseline     Perception     Praxis      Cognition Arousal/Alertness: Awake/alert Behavior During Therapy: WFL for tasks assessed/performed Overall Cognitive Status: Impaired/Different from baseline                                 General Comments: only alert to self, however pleasant and agreeable to session        Exercises Other Exercises Other Exercises: Pt educated on falls prevention strategies, safe transfer techniques, and safe use of AE/DME t/o session. OT facilitated funcitonal mobility and self-feeding tasks as described above. See ADL section for additional detail.    Shoulder Instructions       General Comments      Pertinent Vitals/ Pain       Pain Assessment Pain Assessment: No/denies pain Faces Pain Scale: No hurt  Home Living                                          Prior Functioning/Environment              Frequency  Min 2X/week        Progress Toward Goals  OT Goals(current goals can now be found in the care plan section)  Progress towards OT goals: Progressing toward goals  Acute Rehab OT Goals Patient Stated Goal: to get  stronger OT Goal Formulation: With patient Time For Goal Achievement: 05/13/22 Potential to Achieve Goals: Good  Plan Discharge plan remains appropriate;Frequency remains appropriate    Co-evaluation                 AM-PAC OT "6 Clicks" Daily Activity     Outcome Measure   Help from another person eating meals?: A Little Help from another person taking care of personal grooming?: A Little Help from another person toileting, which includes using toliet, bedpan, or urinal?: A Little Help from another person bathing (including washing, rinsing, drying)?: A Little Help from another person to put on and taking off regular upper body clothing?: A Little Help from another person to put on and taking off regular lower body clothing?: A Little 6 Click Score: 18    End of Session Equipment Utilized During Treatment: Gait belt  OT Visit Diagnosis: Unsteadiness on feet (R26.81);Muscle weakness (generalized) (M62.81)  Activity Tolerance Patient tolerated treatment well   Patient Left in chair;with chair alarm set;with call bell/phone within reach   Nurse Communication          Time: 1292-9090 OT Time Calculation (min): 17 min  Charges: OT General Charges $OT Visit: 1 Visit OT Treatments $Self Care/Home Management : 8-22 mins  Shara Blazing, M.S., OTR/L 05/01/22, 2:39 PM

## 2022-05-01 NOTE — Progress Notes (Signed)
PROGRESS NOTE    Jim Blake  CNO:[id number] DOB: 16-May-1935 DOA: 04/29/2022 PCP: Juluis Pitch, MD   Brief Narrative:   87 y.o. male with past medical history significant for paroxysmal atrial fibrillation, essential hypertension, B12 deficiency, dementia, depression/anxiety who presented to [facility] ED on 1/1 via EMS from home with confusion, cough over the last 4 days.  Recently diagnosed with urinary tract infection 8 days prior to admission treated with oral ciprofloxacin.  Patient became progressively more weak with inability to walk due to his progressive symptoms. Influenza A PCR positive. COVID-19 PCR negative. RSV PCR negative. Urinalysis unrevealing. EKG with atrial fibrillation, rate controlled. Chest x-ray with no acute cardiopulmonary disease process. Blood cultures x 2 obtained.  Eventually stool studies were positive for norovirus.   Assessment & Plan:  Principal Problem:   Influenza A Active Problems:   Essential hypertension   Paroxysmal atrial fibrillation (HCC)   Dementia with behavioral disturbance (HCC)   Depression   Acute encephalopathy   Generalized weakness    Acute hypoxic respiratory failure, POA Influenza A viral infection Continue supportive care.  Chest x-ray with no acute cardiopulmonary disease process.  Influenza A viral PCR positive.  COVID/RSV negative. -- Tamiflu 30 mg p.o. twice daily x 5 days, last day 1/6   Paroxysmal atrial fibrillation Rate controlled, not on any rate controlling medications at home. -- Eliquis twice daily   Diarrhea secondary to norovirus gastroenteritis Maintain enteric precautions.  Norovirus gastroenteritis.  C. difficile only positive for antigen.  Gentle hydration diet as tolerated   Essential hypertension -- Continue lisinopril, hold HCTZ.  IV as needed ordered   Dementia --Delirium precautions.  Continue home Aricept and Zoloft   Anxiety/depression: -- Sertraline 100 mg p.o. daily  Hypokalemia - Repletion    Generalized weakness/fatigue/deconditioning:  Patient presenting from home with progressive weakness in the setting of acute viral influenza as above. --PT/OT evaluation: SNF     DVT prophylaxis:  apixaban (ELIQUIS) tablet 5 mg    Code Status: Full Code Family Communication: Grandson at bedside   Disposition Plan:  SNF placement   Consultants:  None   Procedures:  None   Antimicrobials:  Tamiflu    Subjective:  Patient feels okay no complaints this morning.  According to grandson patient mentation is much better.  Sitting up in the chair.  Examination: Constitutional: Not in acute distress Respiratory: Minimal bibasilar crackles Cardiovascular: Normal sinus rhythm, no rubs Abdomen: Nontender nondistended good bowel sounds Musculoskeletal: No edema noted Skin: No rashes seen Neurologic: CN 2-12 grossly intact.  And nonfocal Psychiatric: Normal judgment and insight. Alert and oriented x 3. Normal mood.   Objective: Vitals:   04/30/22 1616 04/30/22 2102 05/01/22 0623 05/01/22 0759  BP: 116/80 (!) 149/91 133/80 (!) 148/85  Pulse: 70 73 73 75  Resp: 19 16 18 18   Temp: 97.7 F (36.5 C) 98.5 F (36.9 C) 97.7 F (36.5 C) 97.7 F (36.5 C)  TempSrc:      SpO2: 95% 96% 95% 96%  Weight:      Height:        Intake/Output Summary (Last 24 hours) at 05/01/2022 0812 Last data filed at 05/01/2022 0505 Gross per 24 hour  Intake 500 ml  Output 400 ml  Net 100 ml   Filed Weights   04/28/22 1949  Weight: 86.2 kg     Data Reviewed:   CBC: Recent Labs  Lab 04/28/22 1951 04/30/22 0557 05/01/22 0619  WBC 8.0 5.6 6.7  NEUTROABS 5.7  --   --  HGB 13.1 13.3 13.4  HCT 40.7 40.8 41.3  MCV 83.7 82.9 82.8  PLT 237 209 241   Basic Metabolic Panel: Recent Labs  Lab 04/28/22 1951 04/30/22 0557 05/01/22 0619  NA 134* 136 141  K 3.9 3.4* 3.6  CL 97* 105 103  CO2 28 27 28   GLUCOSE 104* 87 94  BUN 20 29* 33*  CREATININE 1.20 1.47* 1.52*  CALCIUM 8.6* 8.6* 8.9   MG  --   --  2.3   GFR: Estimated Creatinine Clearance: 39.7 mL/min (A) (by C-G formula based on SCr of 1.52 mg/dL (H)). Liver Function Tests: Recent Labs  Lab 04/28/22 1951  AST 23  ALT 15  ALKPHOS 73  BILITOT 0.7  PROT 7.0  ALBUMIN 3.3*   No results for input(s): "LIPASE", "AMYLASE" in the last 168 hours. No results for input(s): "AMMONIA" in the last 168 hours. Coagulation Profile: Recent Labs  Lab 04/28/22 1951  INR 1.5*   Cardiac Enzymes: No results for input(s): "CKTOTAL", "CKMB", "CKMBINDEX", "TROPONINI" in the last 168 hours. BNP (last 3 results) No results for input(s): "PROBNP" in the last 8760 hours. HbA1C: No results for input(s): "HGBA1C" in the last 72 hours. CBG: No results for input(s): "GLUCAP" in the last 168 hours. Lipid Profile: No results for input(s): "CHOL", "HDL", "LDLCALC", "TRIG", "CHOLHDL", "LDLDIRECT" in the last 72 hours. Thyroid Function Tests: No results for input(s): "TSH", "T4TOTAL", "FREET4", "T3FREE", "THYROIDAB" in the last 72 hours. Anemia Panel: No results for input(s): "VITAMINB12", "FOLATE", "FERRITIN", "TIBC", "IRON", "RETICCTPCT" in the last 72 hours. Sepsis Labs: Recent Labs  Lab 04/28/22 2027 04/30/22 0557  PROCALCITON  --  <0.10  LATICACIDVEN 1.4  --     Recent Results (from the past 240 hour(s))  Culture, blood (Routine x 2)     Status: None (Preliminary result)   Collection Time: 04/28/22  8:27 PM   Specimen: BLOOD  Result Value Ref Range Status   Specimen Description BLOOD LEFT ANTECUBITAL  Final   Special Requests   Final    BOTTLES DRAWN AEROBIC AND ANAEROBIC Blood Culture adequate volume   Culture   Final    NO GROWTH 3 DAYS Performed at [facility], [address], [place], [place]    Report Status PENDING  Incomplete  Resp panel by RT-PCR (RSV, Flu A&B, Covid) Anterior Nasal Swab     Status: Abnormal   Collection Time: 04/28/22  8:27 PM   Specimen: Anterior Nasal Swab  Result  Value Ref Range Status   SARS Coronavirus 2 by RT PCR NEGATIVE NEGATIVE Final    Comment: (NOTE) SARS-CoV-2 target nucleic acids are NOT DETECTED.  The SARS-CoV-2 RNA is generally detectable in upper respiratory specimens during the acute phase of infection. The lowest concentration of SARS-CoV-2 viral copies this assay can detect is 138 copies/mL. A negative result does not preclude SARS-Cov-2 infection and should not be used as the sole basis for treatment or other patient management decisions. A negative result may occur with  improper specimen collection/handling, submission of specimen other than nasopharyngeal swab, presence of viral mutation(s) within the areas targeted by this assay, and inadequate number of viral copies(<138 copies/mL). A negative result must be combined with clinical observations, patient history, and epidemiological information. The expected result is Negative.  Fact Sheet for Patients:  06/27/22  Fact Sheet for Healthcare Providers:  BloggerCourse.com  This test is no t yet approved or cleared by the [employer] and  has been authorized for  detection and/or diagnosis of SARS-CoV-2 by FDA under an Emergency Use Authorization (EUA). This EUA will remain  in effect (meaning this test can be used) for the duration of the COVID-19 declaration under Section 564(b)(1) of the Act, 21 U.S.C.section 360bbb-3(b)(1), unless the authorization is terminated  or revoked sooner.       Influenza A by PCR POSITIVE (A) NEGATIVE Final   Influenza B by PCR NEGATIVE NEGATIVE Final    Comment: (NOTE) The Xpert Xpress SARS-CoV-2/FLU/RSV plus assay is intended as an aid in the diagnosis of influenza from Nasopharyngeal swab specimens and should not be used as a sole basis for treatment. Nasal washings and aspirates are unacceptable for Xpert Xpress SARS-CoV-2/FLU/RSV testing.  Fact Sheet for  Patients: BloggerCourse.com  Fact Sheet for Healthcare Providers: SeriousBroker[employer]it  This test is not yet approved or cleared by the [place] FDA and has been authorized for detection and/or diagnosis of SARS-CoV-2 by FDA under an Emergency Use Authorization (EUA). This EUA will remain in effect (meaning this test can be used) for the duration of the COVID-19 declaration under Section 564(b)(1) of the Act, 21 U.S.C. section 360bbb-3(b)(1), unless the authorization is terminated or revoked.     Resp Syncytial Virus by PCR NEGATIVE NEGATIVE Final    Comment: (NOTE) Fact Sheet for Patients: BloggerCourse.com  Fact Sheet for Healthcare Providers: [employer]ious[employer]ker[employer]it  This test is not yet approved or cleared by the [place] FDA and has been authorized for detection and/or diagnosis of SARS-CoV-2 by FDA under an Emergency Use Authorization (EUA). This EUA will remain in effect (meaning this test can be used) for the duration of the COVID-19 declaration under Section 564(b)(1) of the Act, 21 U.S.C. section 360bbb-3(b)(1), unless the authorization is terminated or revoked.  Performed at [facility], [address] [place], [place] [address]   Culture, blood (Routine x 2)     Status: None (Preliminary result)   Collection Time: 04/29/22  3:53 AM   Specimen: BLOOD  Result Value Ref Range Status   Specimen Description BLOOD RIGHT FA  Final   Special Requests   Final    BOTTLES DRAWN AEROBIC AND ANAEROBIC Blood Culture results may not be optimal due to an excessive volume of blood received in culture bottles   Culture   Final    NO GROWTH 2 DAYS Performed at [facility], [address] [place], [place] [address]    Report Status PENDING  Incomplete  Gastrointestinal Panel by PCR , Stool     Status: Abnormal   Collection Time: 04/29/22  2:34 PM   Specimen:  Stool  Result Value Ref Range Status   Campylobacter species NOT DETECTED NOT DETECTED Final   Plesimonas shigelloides NOT DETECTED NOT DETECTED Final   Salmonella species NOT DETECTED NOT DETECTED Final   Yersinia enterocolitica NOT DETECTED NOT DETECTED Final   Vibrio species NOT DETECTED NOT DETECTED Final   Vibrio cholerae NOT DETECTED NOT DETECTED Final   Enteroaggregative E coli (EAEC) NOT DETECTED NOT DETECTED Final   Enteropathogenic E coli (EPEC) NOT DETECTED NOT DETECTED Final   Enterotoxigenic E coli (ETEC) NOT DETECTED NOT DETECTED Final   Shiga like toxin producing E coli (STEC) NOT DETECTED NOT DETECTED Final   Shigella/Enteroinvasive E coli (EIEC) NOT DETECTED NOT DETECTED Final   Cryptosporidium NOT DETECTED NOT DETECTED Final   Cyclospora cayetanensis NOT DETECTED NOT DETECTED Final   Entamoeba histolytica NOT DETECTED NOT DETECTED Final   Giardia lamblia NOT DETECTED NOT DETECTED Final  Adenovirus F40/41 NOT DETECTED NOT DETECTED Final   Astrovirus NOT DETECTED NOT DETECTED Final   Norovirus GI/GII DETECTED (A) NOT DETECTED Final    Comment: RESULT CALLED TO, READ BACK BY AND VERIFIED WITH: ASHLEY RAMIREZ AT 1623 ON 04/30/22 BY SS    Rotavirus A NOT DETECTED NOT DETECTED Final   Sapovirus (I, II, IV, and V) NOT DETECTED NOT DETECTED Final    Comment: Performed at [facility], [address], [place], [place] [address]  C Difficile Quick Screen w PCR reflex     Status: Abnormal   Collection Time: 04/29/22  5:16 PM   Specimen: STOOL  Result Value Ref Range Status   C Diff antigen POSITIVE (A) NEGATIVE Final   C Diff toxin NEGATIVE NEGATIVE Final   C Diff interpretation Results are indeterminate. See PCR results.  Final    Comment: Performed at [facility], [address] [place], [place] [address]  C. Diff by PCR, Reflexed     Status: None   Collection Time: 04/29/22  5:16 PM  Result Value Ref Range Status   Toxigenic C. Difficile by PCR  NEGATIVE NEGATIVE Final    Comment: Patient is colonized with non toxigenic C. difficile. May not need treatment unless significant symptoms are present. Performed at [facility], [address], [place], [place] [address]          Radiology Studies: No results found.      Scheduled Meds:  apixaban  5 mg Oral BID   donepezil  10 mg Oral QHS   lisinopril  20 mg Oral BID   And   hydrochlorothiazide  12.5 mg Oral BID   multivitamin with minerals  1 tablet Oral Daily   oseltamivir  30 mg Oral BID   potassium chloride SA  20 mEq Oral Daily   sertraline  100 mg Oral Daily   Continuous Infusions:     LOS: 0 days   Time spent= 35 mins    Jim Blake Arsenio Loader, MD [facility]  If 7PM-7AM, please contact night-coverage  05/01/2022, 8:12 AM

## 2022-05-01 NOTE — NC FL2 (Addendum)
[place] LEVEL OF CARE FORM     IDENTIFICATION  Patient Name: Jim Blake Birthdate: 04/11/1936 Sex: male Admission Date (Current Location): 04/29/2022  [place] [facility] and [place] Number:  Engineering geologist and Address:  [facility], [address], [place], [place] [address]      Provider Number: [id number]  Attending Physician Name and Address:  Damita Lack, MD  Relative Name and Phone Number:  DHAIRYA, CORALES (Spouse) [phone]    Current Level of Care: Hospital Recommended Level of Care: [place] Prior Approval Number:    Date Approved/Denied:   PASRR Number: [id number]  Discharge Plan: SNF    Current Diagnoses: Patient Active Problem List   Diagnosis Date Noted   Influenza A 04/29/2022   Paroxysmal atrial fibrillation ([place]) 04/29/2022   Dementia with behavioral disturbance ([place]) 04/29/2022   Depression 04/29/2022   Acute encephalopathy 04/29/2022   Generalized weakness 04/29/2022   Pyuria, sterile 10/10/2020   Essential hypertension 10/10/2020   Anxiety 10/10/2020   B12 deficiency 10/10/2020   Atrial fibrillation, chronic ([place]) 10/10/2020   Orthostatic hypotension 10/10/2020   Multiple falls 10/10/2020    Orientation RESPIRATION BLADDER Height & Weight     Self  Normal Incontinent Weight: 190 lb (86.2 kg) Height:  6' (182.9 cm)  BEHAVIORAL SYMPTOMS/MOOD NEUROLOGICAL BOWEL NUTRITION STATUS      Continent Diet  AMBULATORY STATUS COMMUNICATION OF NEEDS Skin   Limited Assist Verbally Normal                       Personal Care Assistance Level of Assistance  Bathing, Feeding, Dressing Bathing Assistance: Limited assistance Feeding assistance: Limited assistance Dressing Assistance: Limited assistance     Functional Limitations Info  Sight, Hearing, Speech Sight Info: Adequate Hearing Info: Adequate Speech Info: Adequate    SPECIAL CARE FACTORS FREQUENCY  PT (By licensed PT), OT (By  licensed OT)     PT Frequency: 5 times a week OT Frequency: 5 times a week            Contractures Contractures Info: Not present    Additional Factors Info  Code Status, Allergies, Isolation Precautions Code Status Info: FULL Allergies Info: none known     Isolation Precautions Info: Droplet and enteric precautions     Current Medications (05/01/2022):  This is the current hospital active medication list Current Facility-Administered Medications  Medication Dose Route Frequency Provider Last Rate Last Admin   0.9 %  sodium chloride infusion   Intravenous Continuous Amin, Ankit Chirag, MD 50 mL/hr at 05/01/22 0843 New Bag at 05/01/22 0843   acetaminophen (TYLENOL) tablet 650 mg  650 mg Oral Q6H PRN Mansy, Jan A, MD       Or   acetaminophen (TYLENOL) suppository 650 mg  650 mg Rectal Q6H PRN Mansy, Jan A, MD       apixaban (ELIQUIS) tablet 5 mg  5 mg Oral BID Mansy, Jan A, MD   5 mg at 05/01/22 0842   donepezil (ARICEPT) tablet 10 mg  10 mg Oral QHS Mansy, Jan A, MD   10 mg at 04/30/22 2118   guaiFENesin (ROBITUSSIN) 100 MG/5ML liquid 5 mL  5 mL Oral Q4H PRN Amin, Ankit Chirag, MD       hydrALAZINE (APRESOLINE) injection 10 mg  10 mg Intravenous Q4H PRN Amin, Ankit Chirag, MD       ipratropium-albuterol (DUONEB) 0.5-2.5 (3) MG/3ML nebulizer solution 3 mL  3 mL Nebulization  Q4H PRN Damita Lack, MD       lisinopril (ZESTRIL) tablet 20 mg  20 mg Oral BID [place] Eric J, DO   20 mg at 05/01/22 7893   loperamide (IMODIUM) capsule 2 mg  2 mg Oral PRN [place] Donnamarie Poag, DO       magnesium hydroxide (MILK OF MAGNESIA) suspension 30 mL  30 mL Oral Daily PRN Mansy, Jan A, MD       metoprolol tartrate (LOPRESSOR) injection 5 mg  5 mg Intravenous Q4H PRN Amin, Jeanella Flattery, MD       multivitamin with minerals tablet 1 tablet  1 tablet Oral Daily Mansy, Jan A, MD   1 tablet at 05/01/22 0842   ondansetron (ZOFRAN) tablet 4 mg  4 mg Oral Q6H PRN Mansy, Jan A, MD       Or   ondansetron  [facility]) injection 4 mg  4 mg Intravenous Q6H PRN Mansy, Jan A, MD       oseltamivir (TAMIFLU) capsule 30 mg  30 mg Oral BID [place] Eric J, DO   30 mg at 05/01/22 8101   potassium chloride SA (KLOR-CON M) CR tablet 20 mEq  20 mEq Oral Daily Mansy, Jan A, MD   20 mEq at 05/01/22 7510   senna-docusate (Senokot-S) tablet 1 tablet  1 tablet Oral QHS PRN Damita Lack, MD       sertraline (ZOLOFT) tablet 100 mg  100 mg Oral Daily Mansy, Jan A, MD   100 mg at 05/01/22 0841   traZODone (DESYREL) tablet 50 mg  50 mg Oral QHS PRN Damita Lack, MD         Discharge Medications: Please see discharge summary for a list of discharge medications.  Relevant Imaging Results:  Relevant Lab Results:   Additional Information SS# [id number]  Gerilyn Pilgrim, LCSW

## 2022-05-01 NOTE — Progress Notes (Signed)
Physical Therapy Treatment Patient Details Name: Jim Blake MRN: [id number] DOB: 02-01-1936 Today's Date: 05/01/2022   History of Present Illness 87 y.o. Caucasian male with medical history significant for atrial fibrillation, hypertension and vitamin B12 deficiency, presented to the emergency room with acute onset of cough over the last 4 days which has been occasionally productive of clear sputum without dyspnea or wheezing however with altered mental status with worsening confusion and disorientation per his family.  He did not have any reported fever or chills.  No dysuria, oliguria or hematuria or flank pain currently.  He was treated for a UTI before Christmas with p.o. Cipro.  He was significantly weak today he could not walk. He is positive for flu A.    PT Comments    Pt is making gradual progress towards goals with ability to ambulate short distance in room. Needs heavy cues for sequencing of AD. Pt still remains confused, however not impulsive and can follow commands. Grandson at bedside. Will continue to progress as able.   Recommendations for follow up therapy are one component of a multi-disciplinary discharge planning process, led by the attending physician.  Recommendations may be updated based on patient status, additional functional criteria and insurance authorization.  Follow Up Recommendations  Skilled nursing-short term rehab (<3 hours/day) Can patient physically be transported by private vehicle: Yes   Assistance Recommended at Discharge Frequent or constant Supervision/Assistance  Patient can return home with the following A lot of help with walking and/or transfers;A lot of help with bathing/dressing/bathroom   Equipment Recommendations   (TBD)    Recommendations for Other Services       Precautions / Restrictions Precautions Precautions: Fall Restrictions Weight Bearing Restrictions: No     Mobility  Bed Mobility Overal bed mobility: Needs Assistance Bed  Mobility: Supine to Sit     Supine to sit: Min assist     General bed mobility comments: needs assist for trunkal elevation. Once seated at EOB, upright posture noted. Follows commands well    Transfers Overall transfer level: Needs assistance Equipment used: Rolling walker (2 wheels) Transfers: Sit to/from Stand, Bed to chair/wheelchair/BSC Sit to Stand: Min assist           General transfer comment: cues for sequencing. Able to stand with upright posture    Ambulation/Gait Ambulation/Gait assistance: Min assist Gait Distance (Feet): 10 Feet Assistive device: Rolling walker (2 wheels) Gait Pattern/deviations: Step-to pattern       General Gait Details: ambulated around bed, however then declines to further ambulate secondary to weakness. RW used with heavy cues for sequencing as he tends to stay outside the walker.   Stairs             Wheelchair Mobility    Modified Rankin (Stroke Patients Only)       Balance Overall balance assessment: Needs assistance Sitting-balance support: Feet supported Sitting balance-Leahy Scale: Good     Standing balance support: During functional activity, Single extremity supported, Bilateral upper extremity supported Standing balance-Leahy Scale: Fair                              Cognition Arousal/Alertness: Awake/alert Behavior During Therapy: WFL for tasks assessed/performed Overall Cognitive Status: Impaired/Different from baseline                                 General Comments: only alert to  self, however pleasant and agreeable to session        Exercises Other Exercises Other Exercises: seated ther-ex performed including LAQ and hip add squeezes. 10 reps performed with cues for sequencing    General Comments        Pertinent Vitals/Pain Pain Assessment Pain Assessment: No/denies pain    Home Living                          Prior Function            PT Goals  (current goals can now be found in the care plan section) Acute Rehab PT Goals Patient Stated Goal: unable to state PT Goal Formulation: With family Time For Goal Achievement: 05/13/22 Potential to Achieve Goals: Fair Progress towards PT goals: Progressing toward goals    Frequency    Min 2X/week      PT Plan Current plan remains appropriate    Co-evaluation              AM-PAC PT "6 Clicks" Mobility   Outcome Measure  Help needed turning from your back to your side while in a flat bed without using bedrails?: A Little Help needed moving from lying on your back to sitting on the side of a flat bed without using bedrails?: A Little Help needed moving to and from a bed to a chair (including a wheelchair)?: A Little Help needed standing up from a chair using your arms (e.g., wheelchair or bedside chair)?: A Little Help needed to walk in hospital room?: A Little Help needed climbing 3-5 steps with a railing? : A Lot 6 Click Score: 17    End of Session Equipment Utilized During Treatment: Gait belt Activity Tolerance: Patient limited by fatigue Patient left: in chair;with chair alarm set Nurse Communication: Mobility status PT Visit Diagnosis: Unsteadiness on feet (R26.81);Muscle weakness (generalized) (M62.81);History of falling (Z91.81);Difficulty in walking, not elsewhere classified (R26.2)     Time: 6834-1962 PT Time Calculation (min) (ACUTE ONLY): 23 min  Charges:  $Gait Training: 8-22 mins $Therapeutic Exercise: 8-22 mins                     Greggory Stallion, PT, DPT, GCS [phone]    Jim Blake 05/01/2022, 12:44 PM

## 2022-05-01 NOTE — TOC Progression Note (Addendum)
Transition of Care [facility]) - Progression Note    Patient Details  Name: Jim Blake MRN: [id number] Date of Birth: 06-01-1935  [facility] CM/SW Contact  Gerilyn Pilgrim, LCSW Phone Number: 05/01/2022, 10:28 AM  Clinical Narrative:   SW spoke with wife regarding MOON letter and skilled placement. She states she would like [place] to follow up with her daughter regarding placement. SW reached out to daughter wendy but unable to leave a voicemail sw will continue to follow up.   11:17am Spoke with wife about inability to reach daughter. Asked if a general bed search could be started. Wife adamant she did not want this until daughter could be consulted.      4:12PM Spoke with daughter Salvatore Marvel agreeable to referrals to be sent out in the [place] area. PASSR started pt triggered a level 2 passr.   Expected Discharge Plan and Services                                               Social Determinants of Health (SDOH) Interventions SDOH Screenings   Tobacco Use: High Risk (04/30/2022)    Readmission Risk Interventions     No data to display

## 2022-05-02 DIAGNOSIS — J101 Influenza due to other identified influenza virus with other respiratory manifestations: Secondary | ICD-10-CM | POA: Diagnosis not present

## 2022-05-02 LAB — CBC
HCT: 39.7 % (ref 39.0–52.0)
Hemoglobin: 12.9 g/dL — ABNORMAL LOW (ref 13.0–17.0)
MCH: 27.2 pg (ref 26.0–34.0)
MCHC: 32.5 g/dL (ref 30.0–36.0)
MCV: 83.6 fL (ref 80.0–100.0)
Platelets: 261 10*3/uL (ref 150–400)
RBC: 4.75 MIL/uL (ref 4.22–5.81)
RDW: 13.6 % (ref 11.5–15.5)
WBC: 7.3 10*3/uL (ref 4.0–10.5)
nRBC: 0 % (ref 0.0–0.2)

## 2022-05-02 LAB — BASIC METABOLIC PANEL
Anion gap: 6 (ref 5–15)
BUN: 33 mg/dL — ABNORMAL HIGH (ref 8–23)
CO2: 29 mmol/L (ref 22–32)
Calcium: 8.8 mg/dL — ABNORMAL LOW (ref 8.9–10.3)
Chloride: 104 mmol/L (ref 98–111)
Creatinine, Ser: 1.48 mg/dL — ABNORMAL HIGH (ref 0.61–1.24)
GFR, Estimated: 46 mL/min — ABNORMAL LOW (ref >60)
Glucose, Bld: 90 mg/dL (ref 70–99)
Potassium: 3.5 mmol/L (ref 3.5–5.1)
Sodium: 139 mmol/L (ref 135–145)

## 2022-05-02 LAB — MAGNESIUM: Magnesium: 2.1 mg/dL (ref 1.7–2.4)

## 2022-05-02 MED ORDER — POTASSIUM CHLORIDE 20 MEQ PO PACK
40.0000 meq | PACK | Freq: Once | ORAL | Status: AC
  Administered 2022-05-02: 40 meq via ORAL
  Filled 2022-05-02: qty 2

## 2022-05-02 NOTE — Progress Notes (Signed)
Patient family requesting PT reevaluation as patient has since improved from original evaluation. RN was able to assist patient to the bathroom with minimal assistance. Patient was able to balance on one leg while changing brief.  Family would also like consider getting physical therapy ordered at home.

## 2022-05-02 NOTE — TOC Progression Note (Addendum)
Transition of Care [facility]) - Progression Note    Patient Details  Name: Jim Blake MRN: [id number] Date of Birth: 1935-05-30  Transition of Care [facility]) CM/SW Contact  Gerilyn Pilgrim, LCSW Phone Number: 05/02/2022, 8:30 AM  Clinical Narrative:   Pt triggered level 2 Passr. Additional information uploaded.      Passr received [phone] [id number]      Expected Discharge Plan and Services                                               Social Determinants of Health (SDOH) Interventions SDOH Screenings   Tobacco Use: High Risk (04/30/2022)    Readmission Risk Interventions     No data to display

## 2022-05-02 NOTE — TOC Progression Note (Signed)
Transition of Care [facility]) - Progression Note    Patient Details  Name: Jim Blake MRN: [id number] Date of Birth: Aug 18, 1935  Transition of Care [facility]) CM/SW Contact  Gerilyn Pilgrim, LCSW Phone Number: 05/02/2022, 2:52 PM  Clinical Narrative:   SW spoke with wendy who states she is going to discuss with her mom about her top two choices for SNF [facility] and [facility]. Abigail Butts states she will call SW back today or if unable to reach will reach out to the weekend case manager. If [facility] they state they wouldn't be able to accept until Monday.          Expected Discharge Plan and Services                                               Social Determinants of Health (SDOH) Interventions SDOH Screenings   Tobacco Use: High Risk (04/30/2022)    Readmission Risk Interventions     No data to display

## 2022-05-02 NOTE — Progress Notes (Signed)
PROGRESS NOTE    Lister Brizzi  DXI:[id number] DOB: [birth year] DOA: 04/29/2022 PCP: Juluis Pitch, MD   Brief Narrative:   87 y.o. male with past medical history significant for paroxysmal atrial fibrillation, essential hypertension, B12 deficiency, dementia, depression/anxiety who presented to [facility] ED on 1/1 via EMS from home with confusion, cough over the last 4 days.  Recently diagnosed with urinary tract infection 8 days prior to admission treated with oral ciprofloxacin.  Patient became progressively more weak with inability to walk due to his progressive symptoms. Influenza A PCR positive. COVID-19 PCR negative. RSV PCR negative. Urinalysis unrevealing. EKG with atrial fibrillation, rate controlled. Chest x-ray with no acute cardiopulmonary disease process. Blood cultures x 2 obtained.  Eventually stool studies were positive for norovirus.  PT has recommended SNF   Assessment & Plan:  Principal Problem:   Influenza A Active Problems:   Essential hypertension   Paroxysmal atrial fibrillation (HCC)   Dementia with behavioral disturbance (HCC)   Depression   Acute encephalopathy   Generalized weakness    Acute hypoxic respiratory failure, POA Influenza A viral infection Continue supportive care.  Chest x-ray with no acute cardiopulmonary disease process.  Influenza A viral PCR positive.  COVID/RSV negative. -- Tamiflu 30 mg p.o. twice daily x 5 days, last day 1/6 tomorrow   Paroxysmal atrial fibrillation Rate controlled, not on any rate controlling medications at home. -- Eliquis twice daily   Diarrhea secondary to norovirus gastroenteritis Maintain enteric precautions.  Norovirus gastroenteritis.  C. difficile only positive for antigen.  Seems to have improved   Essential hypertension -- Continue lisinopril, hold HCTZ.  IV as needed ordered   Dementia --Delirium precautions.  Continue home Aricept and Zoloft   Anxiety/depression: -- Sertraline 100 mg p.o.  daily  Hypokalemia - Repletion   Generalized weakness/fatigue/deconditioning:  Patient presenting from home with progressive weakness in the setting of acute viral influenza as above. --PT/OT evaluation: SNF     DVT prophylaxis:  apixaban (ELIQUIS) tablet 5 mg    Code Status: Full Code Family Communication: Son is at bedside   Disposition Plan:  SNF placement   Consultants:  None   Procedures:  None   Antimicrobials:  Tamiflu    Subjective:  Doing well no complaints  Examination: Constitutional: Not in acute distress.  Elderly frail Respiratory: Clear to auscultation bilaterally Cardiovascular: Normal sinus rhythm, no rubs Abdomen: Nontender nondistended good bowel sounds Musculoskeletal: No edema noted Skin: No rashes seen Neurologic: CN 2-12 grossly intact.  And nonfocal Psychiatric: Normal judgment and insight. Alert and oriented x 3. Normal mood.  Objective: Vitals:   05/01/22 1915 05/02/22 0325 05/02/22 0502 05/02/22 0812  BP: 123/71 122/88 120/88 (!) 164/88  Pulse: 65 77 76 61  Resp: (!) 22  18 16   Temp: 98.3 F (36.8 C)  97.7 F (36.5 C) 98.2 F (36.8 C)  TempSrc:      SpO2: 95% 99% 98% 99%  Weight:      Height:       No intake or output data in the 24 hours ending 05/02/22 1243  Filed Weights   04/28/22 1949  Weight: 86.2 kg     Data Reviewed:   CBC: Recent Labs  Lab 04/28/22 1951 04/30/22 0557 05/01/22 0619 05/02/22 0631  WBC 8.0 5.6 6.7 7.3  NEUTROABS 5.7  --   --   --   HGB 13.1 13.3 13.4 12.9*  HCT 40.7 40.8 41.3 39.7  MCV 83.7 82.9 82.8 83.6  PLT 237 209  241 657   Basic Metabolic Panel: Recent Labs  Lab 04/28/22 1951 04/30/22 0557 05/01/22 0619 05/02/22 0631  NA 134* 136 141 139  K 3.9 3.4* 3.6 3.5  CL 97* 105 103 104  CO2 28 27 28 29   GLUCOSE 104* 87 94 90  BUN 20 29* 33* 33*  CREATININE 1.20 1.47* 1.52* 1.48*  CALCIUM 8.6* 8.6* 8.9 8.8*  MG  --   --  2.3 2.1   GFR: Estimated Creatinine Clearance: 40.8  mL/min (A) (by C-G formula based on SCr of 1.48 mg/dL (H)). Liver Function Tests: Recent Labs  Lab 04/28/22 1951  AST 23  ALT 15  ALKPHOS 73  BILITOT 0.7  PROT 7.0  ALBUMIN 3.3*   No results for input(s): "LIPASE", "AMYLASE" in the last 168 hours. No results for input(s): "AMMONIA" in the last 168 hours. Coagulation Profile: Recent Labs  Lab 04/28/22 1951  INR 1.5*   Cardiac Enzymes: No results for input(s): "CKTOTAL", "CKMB", "CKMBINDEX", "TROPONINI" in the last 168 hours. BNP (last 3 results) No results for input(s): "PROBNP" in the last 8760 hours. HbA1C: No results for input(s): "HGBA1C" in the last 72 hours. CBG: No results for input(s): "GLUCAP" in the last 168 hours. Lipid Profile: No results for input(s): "CHOL", "HDL", "LDLCALC", "TRIG", "CHOLHDL", "LDLDIRECT" in the last 72 hours. Thyroid Function Tests: No results for input(s): "TSH", "T4TOTAL", "FREET4", "T3FREE", "THYROIDAB" in the last 72 hours. Anemia Panel: No results for input(s): "VITAMINB12", "FOLATE", "FERRITIN", "TIBC", "IRON", "RETICCTPCT" in the last 72 hours. Sepsis Labs: Recent Labs  Lab 04/28/22 2027 04/30/22 0557  PROCALCITON  --  <0.10  LATICACIDVEN 1.4  --     Recent Results (from the past 240 hour(s))  Culture, blood (Routine x 2)     Status: None (Preliminary result)   Collection Time: 04/28/22  8:27 PM   Specimen: BLOOD  Result Value Ref Range Status   Specimen Description BLOOD LEFT ANTECUBITAL  Final   Special Requests   Final    BOTTLES DRAWN AEROBIC AND ANAEROBIC Blood Culture adequate volume   Culture   Final    NO GROWTH 4 DAYS Performed at [facility], [address], [place], [place] [address]    Report Status PENDING  Incomplete  Resp panel by RT-PCR (RSV, Flu A&B, Covid) Anterior Nasal Swab     Status: Abnormal   Collection Time: 04/28/22  8:27 PM   Specimen: Anterior Nasal Swab  Result Value Ref Range Status   SARS Coronavirus 2 by RT PCR NEGATIVE  NEGATIVE Final    Comment: (NOTE) SARS-CoV-2 target nucleic acids are NOT DETECTED.  The SARS-CoV-2 RNA is generally detectable in upper respiratory specimens during the acute phase of infection. The lowest concentration of SARS-CoV-2 viral copies this assay can detect is 138 copies/mL. A negative result does not preclude SARS-Cov-2 infection and should not be used as the sole basis for treatment or other patient management decisions. A negative result may occur with  improper specimen collection/handling, submission of specimen other than nasopharyngeal swab, presence of viral mutation(s) within the areas targeted by this assay, and inadequate number of viral copies(<138 copies/mL). A negative result must be combined with clinical observations, patient history, and epidemiological information. The expected result is Negative.  Fact Sheet for Patients:  EntrepreneurPulse.com.au  Fact Sheet for Healthcare Providers:  IncredibleEmployment.be  This test is no t yet approved or cleared by the [place] FDA and  has been authorized for detection and/or diagnosis of SARS-CoV-2 by  FDA under an Emergency Use Authorization (EUA). This EUA will remain  in effect (meaning this test can be used) for the duration of the COVID-19 declaration under Section 564(b)(1) of the Act, 21 U.S.C.section 360bbb-3(b)(1), unless the authorization is terminated  or revoked sooner.       Influenza A by PCR POSITIVE (A) NEGATIVE Final   Influenza B by PCR NEGATIVE NEGATIVE Final    Comment: (NOTE) The Xpert Xpress SARS-CoV-2/FLU/RSV plus assay is intended as an aid in the diagnosis of influenza from Nasopharyngeal swab specimens and should not be used as a sole basis for treatment. Nasal washings and aspirates are unacceptable for Xpert Xpress SARS-CoV-2/FLU/RSV testing.  Fact Sheet for Patients: BloggerCourse.com  Fact Sheet for  Healthcare Providers: SeriousBroker.it  This test is not yet approved or cleared by the [place] FDA and has been authorized for detection and/or diagnosis of SARS-CoV-2 by FDA under an Emergency Use Authorization (EUA). This EUA will remain in effect (meaning this test can be used) for the duration of the COVID-19 declaration under Section 564(b)(1) of the Act, 21 U.S.C. section 360bbb-3(b)(1), unless the authorization is terminated or revoked.     Resp Syncytial Virus by PCR NEGATIVE NEGATIVE Final    Comment: (NOTE) Fact Sheet for Patients: BloggerCourse.com  Fact Sheet for Healthcare Providers: SeriousBroker.it  This test is not yet approved or cleared by the [place] FDA and has been authorized for detection and/or diagnosis of SARS-CoV-2 by FDA under an Emergency Use Authorization (EUA). This EUA will remain in effect (meaning this test can be used) for the duration of the COVID-19 declaration under Section 564(b)(1) of the Act, 21 U.S.C. section 360bbb-3(b)(1), unless the authorization is terminated or revoked.  Performed at [facility], [address] [place], [place] [address]   Culture, blood (Routine x 2)     Status: None (Preliminary result)   Collection Time: 04/29/22  3:53 AM   Specimen: BLOOD  Result Value Ref Range Status   Specimen Description BLOOD RIGHT FA  Final   Special Requests   Final    BOTTLES DRAWN AEROBIC AND ANAEROBIC Blood Culture results may not be optimal due to an excessive volume of blood received in culture bottles   Culture   Final    NO GROWTH 3 DAYS Performed at [facility], [address] [place], [place] [address]    Report Status PENDING  Incomplete  Gastrointestinal Panel by PCR , Stool     Status: Abnormal   Collection Time: 04/29/22  2:34 PM   Specimen: Stool  Result Value Ref Range Status   Campylobacter species NOT  DETECTED NOT DETECTED Final   Plesimonas shigelloides NOT DETECTED NOT DETECTED Final   Salmonella species NOT DETECTED NOT DETECTED Final   Yersinia enterocolitica NOT DETECTED NOT DETECTED Final   Vibrio species NOT DETECTED NOT DETECTED Final   Vibrio cholerae NOT DETECTED NOT DETECTED Final   Enteroaggregative E coli (EAEC) NOT DETECTED NOT DETECTED Final   Enteropathogenic E coli (EPEC) NOT DETECTED NOT DETECTED Final   Enterotoxigenic E coli (ETEC) NOT DETECTED NOT DETECTED Final   Shiga like toxin producing E coli (STEC) NOT DETECTED NOT DETECTED Final   Shigella/Enteroinvasive E coli (EIEC) NOT DETECTED NOT DETECTED Final   Cryptosporidium NOT DETECTED NOT DETECTED Final   Cyclospora cayetanensis NOT DETECTED NOT DETECTED Final   Entamoeba histolytica NOT DETECTED NOT DETECTED Final   Giardia lamblia NOT DETECTED NOT DETECTED Final   Adenovirus F40/41 NOT DETECTED  NOT DETECTED Final   Astrovirus NOT DETECTED NOT DETECTED Final   Norovirus GI/GII DETECTED (A) NOT DETECTED Final    Comment: RESULT CALLED TO, READ BACK BY AND VERIFIED WITH: ASHLEY RAMIREZ AT 1623 ON 04/30/22 BY SS    Rotavirus A NOT DETECTED NOT DETECTED Final   Sapovirus (I, II, IV, and V) NOT DETECTED NOT DETECTED Final    Comment: Performed at [facility], [address], [place], [place] [address]  C Difficile Quick Screen w PCR reflex     Status: Abnormal   Collection Time: 04/29/22  5:16 PM   Specimen: STOOL  Result Value Ref Range Status   C Diff antigen POSITIVE (A) NEGATIVE Final   C Diff toxin NEGATIVE NEGATIVE Final   C Diff interpretation Results are indeterminate. See PCR results.  Final    Comment: Performed at [facility], [address] [place], [place] [address]  C. Diff by PCR, Reflexed     Status: None   Collection Time: 04/29/22  5:16 PM  Result Value Ref Range Status   Toxigenic C. Difficile by PCR NEGATIVE NEGATIVE Final    Comment: Patient is colonized with non  toxigenic C. difficile. May not need treatment unless significant symptoms are present. Performed at [facility], [address], [place], [place] [address]          Radiology Studies: No results found.      Scheduled Meds:  apixaban  5 mg Oral BID   donepezil  10 mg Oral QHS   lisinopril  20 mg Oral BID   multivitamin with minerals  1 tablet Oral Daily   oseltamivir  30 mg Oral BID   sertraline  100 mg Oral Daily   Continuous Infusions:     LOS: 1 day   Time spent= 35 mins    Leland Staszewski Joline Maxcy, MD [facility]  If 7PM-7AM, please contact night-coverage  05/02/2022, 12:43 PM

## 2022-05-03 DIAGNOSIS — J101 Influenza due to other identified influenza virus with other respiratory manifestations: Secondary | ICD-10-CM | POA: Diagnosis not present

## 2022-05-03 LAB — BASIC METABOLIC PANEL
Anion gap: 9 (ref 5–15)
BUN: 32 mg/dL — ABNORMAL HIGH (ref 8–23)
CO2: 27 mmol/L (ref 22–32)
Calcium: 8.9 mg/dL (ref 8.9–10.3)
Chloride: 105 mmol/L (ref 98–111)
Creatinine, Ser: 1.31 mg/dL — ABNORMAL HIGH (ref 0.61–1.24)
GFR, Estimated: 54 mL/min — ABNORMAL LOW (ref >60)
Glucose, Bld: 91 mg/dL (ref 70–99)
Potassium: 3.3 mmol/L — ABNORMAL LOW (ref 3.5–5.1)
Sodium: 141 mmol/L (ref 135–145)

## 2022-05-03 LAB — CULTURE, BLOOD (ROUTINE X 2)
Culture: NO GROWTH
Special Requests: ADEQUATE

## 2022-05-03 LAB — CBC
HCT: 39.6 % (ref 39.0–52.0)
Hemoglobin: 13 g/dL (ref 13.0–17.0)
MCH: 27.4 pg (ref 26.0–34.0)
MCHC: 32.8 g/dL (ref 30.0–36.0)
MCV: 83.4 fL (ref 80.0–100.0)
Platelets: 263 10*3/uL (ref 150–400)
RBC: 4.75 MIL/uL (ref 4.22–5.81)
RDW: 13.5 % (ref 11.5–15.5)
WBC: 6.7 10*3/uL (ref 4.0–10.5)
nRBC: 0 % (ref 0.0–0.2)

## 2022-05-03 LAB — MAGNESIUM: Magnesium: 2.2 mg/dL (ref 1.7–2.4)

## 2022-05-03 MED ORDER — POTASSIUM CHLORIDE CRYS ER 20 MEQ PO TBCR
40.0000 meq | EXTENDED_RELEASE_TABLET | Freq: Once | ORAL | Status: AC
  Administered 2022-05-03: 40 meq via ORAL
  Filled 2022-05-03: qty 2

## 2022-05-03 NOTE — TOC Progression Note (Addendum)
Transition of Care [facility]) - Progression Note    Patient Details  Name: Jim Blake MRN: [id number] Date of Birth: 13-Dec-1935  Transition of Care [facility]) CM/SW Contact  Izola Price, RN Phone Number: 05/03/2022, 4:37 PM  Clinical Narrative:  1/6: Per RN progress note, family has requested a PT re-evaluation as they feel patient improving and wish to do home with [facility] PT re-evaluation pending. Simmie Davies RN CM           Expected Discharge Plan and Services                                               Social Determinants of Health (SDOH) Interventions SDOH Screenings   Tobacco Use: High Risk (04/30/2022)    Readmission Risk Interventions     No data to display

## 2022-05-03 NOTE — Progress Notes (Signed)
PROGRESS NOTE    Jim Blake  ZWC:[id number] DOB: 20-Jul-1935 DOA: 04/29/2022 PCP: Juluis Pitch, MD   Brief Narrative:   87 y.o. male with past medical history significant for paroxysmal atrial fibrillation, essential hypertension, B12 deficiency, dementia, depression/anxiety who presented to [facility] ED on 1/1 via EMS from home with confusion, cough over the last 4 days.  Recently diagnosed with urinary tract infection 8 days prior to admission treated with oral ciprofloxacin.  Patient became progressively more weak with inability to walk due to his progressive symptoms. Influenza A PCR positive. COVID-19 PCR negative. RSV PCR negative. Urinalysis unrevealing. EKG with atrial fibrillation, rate controlled. Chest x-ray with no acute cardiopulmonary disease process. Blood cultures x 2 obtained.  Eventually stool studies were positive for norovirus.  PT has recommended SNF   Assessment & Plan:  Principal Problem:   Influenza A Active Problems:   Essential hypertension   Paroxysmal atrial fibrillation (HCC)   Dementia with behavioral disturbance (HCC)   Depression   Acute encephalopathy   Generalized weakness    Acute hypoxic respiratory failure, POA Influenza A viral infection Continue supportive care.  Chest x-ray with no acute cardiopulmonary disease process.  Influenza A viral PCR positive.  COVID/RSV negative. -- Tamiflu 30 mg p.o. twice daily x 5 days, last day today   Paroxysmal atrial fibrillation Rate controlled, not on any rate controlling medications at home. -- Eliquis twice daily   Diarrhea secondary to norovirus gastroenteritis Maintain enteric precautions.  Norovirus gastroenteritis.  C. difficile only positive for antigen.  Seems to have improved   Essential hypertension -- Continue lisinopril, hold HCTZ.  IV as needed ordered   Dementia --Delirium precautions.  Continue home Aricept and Zoloft   Anxiety/depression: -- Sertraline 100 mg p.o. daily  Hypokalemia -  Repletion   Generalized weakness/fatigue/deconditioning:  Patient presenting from home with progressive weakness in the setting of acute viral influenza as above. --PT/OT evaluation: SNF     DVT prophylaxis:  apixaban (ELIQUIS) tablet 5 mg    Code Status: Full Code Family Communication: Son is at bedside   Disposition Plan:  SNF placement   Consultants:  None   Procedures:  None   Antimicrobials:  Tamiflu    Subjective: No complaints, eating his breakfast  Examination: Constitutional: Not in acute distress Respiratory: Clear to auscultation bilaterally Cardiovascular: Normal sinus rhythm, no rubs Abdomen: Nontender nondistended good bowel sounds Musculoskeletal: No edema noted Skin: No rashes seen Neurologic: CN 2-12 grossly intact.  And nonfocal Psychiatric: Normal judgment and insight. Alert and oriented x 3. Normal mood.  Objective: Vitals:   05/02/22 1600 05/03/22 0503 05/03/22 0757 05/03/22 0853  BP: 131/66 (!) 144/85 135/72   Pulse: 71 64 (!) 54 (!) 56  Resp:  16 14   Temp: 98.4 F (36.9 C) 97.9 F (36.6 C) 97.9 F (36.6 C)   TempSrc:      SpO2: 99% 96% 96%   Weight:      Height:        Intake/Output Summary (Last 24 hours) at 05/03/2022 1107 Last data filed at 05/03/2022 0400 Gross per 24 hour  Intake 160 ml  Output 200 ml  Net -40 ml    Filed Weights   04/28/22 1949  Weight: 86.2 kg     Data Reviewed:   CBC: Recent Labs  Lab 04/28/22 1951 04/30/22 0557 05/01/22 0619 05/02/22 0631 05/03/22 0453  WBC 8.0 5.6 6.7 7.3 6.7  NEUTROABS 5.7  --   --   --   --  HGB 13.1 13.3 13.4 12.9* 13.0  HCT 40.7 40.8 41.3 39.7 39.6  MCV 83.7 82.9 82.8 83.6 83.4  PLT 237 209 241 261 263   Basic Metabolic Panel: Recent Labs  Lab 04/28/22 1951 04/30/22 0557 05/01/22 0619 05/02/22 0631 05/03/22 0453  NA 134* 136 141 139 141  K 3.9 3.4* 3.6 3.5 3.3*  CL 97* 105 103 104 105  CO2 28 27 28 29 27   GLUCOSE 104* 87 94 90 91  BUN 20 29* 33* 33*  32*  CREATININE 1.20 1.47* 1.52* 1.48* 1.31*  CALCIUM 8.6* 8.6* 8.9 8.8* 8.9  MG  --   --  2.3 2.1 2.2   GFR: Estimated Creatinine Clearance: 46.1 mL/min (A) (by C-G formula based on SCr of 1.31 mg/dL (H)). Liver Function Tests: Recent Labs  Lab 04/28/22 1951  AST 23  ALT 15  ALKPHOS 73  BILITOT 0.7  PROT 7.0  ALBUMIN 3.3*   No results for input(s): "LIPASE", "AMYLASE" in the last 168 hours. No results for input(s): "AMMONIA" in the last 168 hours. Coagulation Profile: Recent Labs  Lab 04/28/22 1951  INR 1.5*   Cardiac Enzymes: No results for input(s): "CKTOTAL", "CKMB", "CKMBINDEX", "TROPONINI" in the last 168 hours. BNP (last 3 results) No results for input(s): "PROBNP" in the last 8760 hours. HbA1C: No results for input(s): "HGBA1C" in the last 72 hours. CBG: No results for input(s): "GLUCAP" in the last 168 hours. Lipid Profile: No results for input(s): "CHOL", "HDL", "LDLCALC", "TRIG", "CHOLHDL", "LDLDIRECT" in the last 72 hours. Thyroid Function Tests: No results for input(s): "TSH", "T4TOTAL", "FREET4", "T3FREE", "THYROIDAB" in the last 72 hours. Anemia Panel: No results for input(s): "VITAMINB12", "FOLATE", "FERRITIN", "TIBC", "IRON", "RETICCTPCT" in the last 72 hours. Sepsis Labs: Recent Labs  Lab 04/28/22 2027 04/30/22 0557  PROCALCITON  --  <0.10  LATICACIDVEN 1.4  --     Recent Results (from the past 240 hour(s))  Culture, blood (Routine x 2)     Status: None   Collection Time: 04/28/22  8:27 PM   Specimen: BLOOD  Result Value Ref Range Status   Specimen Description BLOOD LEFT ANTECUBITAL  Final   Special Requests   Final    BOTTLES DRAWN AEROBIC AND ANAEROBIC Blood Culture adequate volume   Culture   Final    NO GROWTH 5 DAYS Performed at [facility], [address] [place], [place]    Report Status 05/03/2022 FINAL  Final  Resp panel by RT-PCR (RSV, Flu A&B, Covid) Anterior Nasal Swab     Status: Abnormal   Collection  Time: 04/28/22  8:27 PM   Specimen: Anterior Nasal Swab  Result Value Ref Range Status   SARS Coronavirus 2 by RT PCR NEGATIVE NEGATIVE Final    Comment: (NOTE) SARS-CoV-2 target nucleic acids are NOT DETECTED.  The SARS-CoV-2 RNA is generally detectable in upper respiratory specimens during the acute phase of infection. The lowest concentration of SARS-CoV-2 viral copies this assay can detect is 138 copies/mL. A negative result does not preclude SARS-Cov-2 infection and should not be used as the sole basis for treatment or other patient management decisions. A negative result may occur with  improper specimen collection/handling, submission of specimen other than nasopharyngeal swab, presence of viral mutation(s) within the areas targeted by this assay, and inadequate number of viral copies(<138 copies/mL). A negative result must be combined with clinical observations, patient history, and epidemiological information. The expected result is Negative.  Fact Sheet for Patients:  06/27/22  Fact Sheet for Healthcare Providers:  IncredibleEmployment.be  This test is no t yet approved or cleared by the [place] FDA and  has been authorized for detection and/or diagnosis of SARS-CoV-2 by FDA under an Emergency Use Authorization (EUA). This EUA will remain  in effect (meaning this test can be used) for the duration of the COVID-19 declaration under Section 564(b)(1) of the Act, 21 U.S.C.section 360bbb-3(b)(1), unless the authorization is terminated  or revoked sooner.       Influenza A by PCR POSITIVE (A) NEGATIVE Final   Influenza B by PCR NEGATIVE NEGATIVE Final    Comment: (NOTE) The Xpert Xpress SARS-CoV-2/FLU/RSV plus assay is intended as an aid in the diagnosis of influenza from Nasopharyngeal swab specimens and should not be used as a sole basis for treatment. Nasal washings and aspirates are unacceptable for Xpert  Xpress SARS-CoV-2/FLU/RSV testing.  Fact Sheet for Patients: EntrepreneurPulse.com.au  Fact Sheet for Healthcare Providers: IncredibleEmployment.be  This test is not yet approved or cleared by the [place] FDA and has been authorized for detection and/or diagnosis of SARS-CoV-2 by FDA under an Emergency Use Authorization (EUA). This EUA will remain in effect (meaning this test can be used) for the duration of the COVID-19 declaration under Section 564(b)(1) of the Act, 21 U.S.C. section 360bbb-3(b)(1), unless the authorization is terminated or revoked.     Resp Syncytial Virus by PCR NEGATIVE NEGATIVE Final    Comment: (NOTE) Fact Sheet for Patients: EntrepreneurPulse.com.au  Fact Sheet for Healthcare Providers: IncredibleEmployment.be  This test is not yet approved or cleared by the [place] FDA and has been authorized for detection and/or diagnosis of SARS-CoV-2 by FDA under an Emergency Use Authorization (EUA). This EUA will remain in effect (meaning this test can be used) for the duration of the COVID-19 declaration under Section 564(b)(1) of the Act, 21 U.S.C. section 360bbb-3(b)(1), unless the authorization is terminated or revoked.  Performed at [facility], [address] [place], [place] [address]   Culture, blood (Routine x 2)     Status: None (Preliminary result)   Collection Time: 04/29/22  3:53 AM   Specimen: BLOOD  Result Value Ref Range Status   Specimen Description BLOOD RIGHT FA  Final   Special Requests   Final    BOTTLES DRAWN AEROBIC AND ANAEROBIC Blood Culture results may not be optimal due to an excessive volume of blood received in culture bottles   Culture   Final    NO GROWTH 4 DAYS Performed at [facility], [address] [place],  [address]    Report Status PENDING  Incomplete  Gastrointestinal Panel by PCR , Stool     Status:  Abnormal   Collection Time: 04/29/22  2:34 PM   Specimen: Stool  Result Value Ref Range Status   Campylobacter species NOT DETECTED NOT DETECTED Final   Plesimonas shigelloides NOT DETECTED NOT DETECTED Final   Salmonella species NOT DETECTED NOT DETECTED Final   Yersinia enterocolitica NOT DETECTED NOT DETECTED Final   Vibrio species NOT DETECTED NOT DETECTED Final   Vibrio cholerae NOT DETECTED NOT DETECTED Final   Enteroaggregative E coli (EAEC) NOT DETECTED NOT DETECTED Final   Enteropathogenic E coli (EPEC) NOT DETECTED NOT DETECTED Final   Enterotoxigenic E coli (ETEC) NOT DETECTED NOT DETECTED Final   Shiga like toxin producing E coli (STEC) NOT DETECTED NOT DETECTED Final   Shigella/Enteroinvasive E coli (EIEC) NOT DETECTED NOT DETECTED Final   Cryptosporidium NOT DETECTED NOT DETECTED  Final   Cyclospora cayetanensis NOT DETECTED NOT DETECTED Final   Entamoeba histolytica NOT DETECTED NOT DETECTED Final   Giardia lamblia NOT DETECTED NOT DETECTED Final   Adenovirus F40/41 NOT DETECTED NOT DETECTED Final   Astrovirus NOT DETECTED NOT DETECTED Final   Norovirus GI/GII DETECTED (A) NOT DETECTED Final    Comment: RESULT CALLED TO, READ BACK BY AND VERIFIED WITH: ASHLEY RAMIREZ AT 1623 ON 04/30/22 BY SS    Rotavirus A NOT DETECTED NOT DETECTED Final   Sapovirus (I, II, IV, and V) NOT DETECTED NOT DETECTED Final    Comment: Performed at [facility], [address] [place], [place] [address]  C Difficile Quick Screen w PCR reflex     Status: Abnormal   Collection Time: 04/29/22  5:16 PM   Specimen: STOOL  Result Value Ref Range Status   C Diff antigen POSITIVE (A) NEGATIVE Final   C Diff toxin NEGATIVE NEGATIVE Final   C Diff interpretation Results are indeterminate. See PCR results.  Final    Comment: Performed at [facility], [address] [place], [place] [address]  C. Diff by PCR, Reflexed     Status: None   Collection Time: 04/29/22  5:16 PM  Result  Value Ref Range Status   Toxigenic C. Difficile by PCR NEGATIVE NEGATIVE Final    Comment: Patient is colonized with non toxigenic C. difficile. May not need treatment unless significant symptoms are present. Performed at [facility], [address], [place], [place] [address]          Radiology Studies: No results found.      Scheduled Meds:  apixaban  5 mg Oral BID   donepezil  10 mg Oral QHS   lisinopril  20 mg Oral BID   multivitamin with minerals  1 tablet Oral Daily   oseltamivir  30 mg Oral BID   sertraline  100 mg Oral Daily   Continuous Infusions:     LOS: 2 days   Time spent= 35 mins    Cyanne Delmar Arsenio Loader, MD [facility]  If 7PM-7AM, please contact night-coverage  05/03/2022, 11:07 AM

## 2022-05-03 NOTE — Progress Notes (Signed)
Mobility Specialist - Progress Note   05/03/22 1101  Mobility  Activity Ambulated with assistance in room;Stood at bedside;Dangled on edge of bed  Level of Assistance Contact guard assist, steadying assist  Assistive Device Front wheel walker  Distance Ambulated (ft) 5 ft  Activity Response Tolerated well  Mobility Referral Yes  $Mobility charge 1 Mobility   Author responds to bed alarm going off. Pt EOB on RA upon arrival. Pt STS and ambulates to recliner CGA with 1 LOB corrected by CGA. Pt needs VC for safety awareness throughout. Pt left in recliner with needs in reach and chair alarm set.   Gretchen Short  Mobility Specialist  05/03/22 11:02 AM

## 2022-05-04 DIAGNOSIS — J101 Influenza due to other identified influenza virus with other respiratory manifestations: Secondary | ICD-10-CM | POA: Diagnosis not present

## 2022-05-04 LAB — BASIC METABOLIC PANEL
Anion gap: 8 (ref 5–15)
BUN: 28 mg/dL — ABNORMAL HIGH (ref 8–23)
CO2: 26 mmol/L (ref 22–32)
Calcium: 8.6 mg/dL — ABNORMAL LOW (ref 8.9–10.3)
Chloride: 105 mmol/L (ref 98–111)
Creatinine, Ser: 1.31 mg/dL — ABNORMAL HIGH (ref 0.61–1.24)
GFR, Estimated: 54 mL/min — ABNORMAL LOW (ref >60)
Glucose, Bld: 87 mg/dL (ref 70–99)
Potassium: 3.5 mmol/L (ref 3.5–5.1)
Sodium: 139 mmol/L (ref 135–145)

## 2022-05-04 LAB — CBC
HCT: 37.5 % — ABNORMAL LOW (ref 39.0–52.0)
Hemoglobin: 12.3 g/dL — ABNORMAL LOW (ref 13.0–17.0)
MCH: 26.8 pg (ref 26.0–34.0)
MCHC: 32.8 g/dL (ref 30.0–36.0)
MCV: 81.7 fL (ref 80.0–100.0)
Platelets: 269 10*3/uL (ref 150–400)
RBC: 4.59 MIL/uL (ref 4.22–5.81)
RDW: 13.5 % (ref 11.5–15.5)
WBC: 7 10*3/uL (ref 4.0–10.5)
nRBC: 0 % (ref 0.0–0.2)

## 2022-05-04 LAB — MAGNESIUM: Magnesium: 2.2 mg/dL (ref 1.7–2.4)

## 2022-05-04 LAB — CULTURE, BLOOD (ROUTINE X 2): Culture: NO GROWTH

## 2022-05-04 NOTE — TOC Progression Note (Addendum)
Transition of Care [facility]) - Progression Note    Patient Details  Name: Jim Blake MRN: [id number] Date of Birth: July 15, 1935  [facility]) CM/SW Contact  Izola Price, RN Phone Number: 05/04/2022, 2:59 PM  Clinical Narrative: 1/7: Requested PT re-evaluation from provider per family request to try to go home with [facility]. Simmie Davies RN CM   NOTE: Re-ordered this am. Thank you. Simmie Davies RN CM          Expected Discharge Plan and Services                                               Social Determinants of Health (SDOH) Interventions SDOH Screenings   Tobacco Use: High Risk (04/30/2022)    Readmission Risk Interventions     No data to display

## 2022-05-04 NOTE — Progress Notes (Signed)
Mobility Specialist - Progress Note   05/04/22 0859  Mobility  Activity Ambulated with assistance in room;Stood at bedside;Dangled on edge of bed  Level of Assistance Standby assist, set-up cues, supervision of patient - no hands on  Assistive Device Front wheel walker  Distance Ambulated (ft) 5 ft  Activity Response Tolerated well  Mobility Referral Yes  $Mobility charge 1 Mobility   Pt supine in bed on RA upon arrival. Pt completes bed mobility and ambulates from bed to chair SBA with VC for each step. Pt left in recliner with needs in reach and chair alarm set.   Gretchen Short  Mobility Specialist  05/04/22 9:00 AM

## 2022-05-04 NOTE — Progress Notes (Signed)
PROGRESS NOTE    Jim Blake  ZMO:[id number] DOB: 1936/03/28 DOA: 04/29/2022 PCP: Dorothey Baseman, MD   Brief Narrative:   87 y.o. male with past medical history significant for paroxysmal atrial fibrillation, essential hypertension, B12 deficiency, dementia, depression/anxiety who presented to [facility] ED on 1/1 via EMS from home with confusion, cough over the last 4 days.  Recently diagnosed with urinary tract infection 8 days prior to admission treated with oral ciprofloxacin.  Patient became progressively more weak with inability to walk due to his progressive symptoms. Influenza A PCR positive. COVID-19 PCR negative. RSV PCR negative. Urinalysis unrevealing. EKG with atrial fibrillation, rate controlled. Chest x-ray with no acute cardiopulmonary disease process. Blood cultures x 2 obtained.  Eventually stool studies were positive for norovirus.  PT has recommended SNF   Assessment & Plan:  Principal Problem:   Influenza A Active Problems:   Essential hypertension   Paroxysmal atrial fibrillation (HCC)   Dementia with behavioral disturbance (HCC)   Depression   Acute encephalopathy   Generalized weakness    Acute hypoxic respiratory failure, POA Influenza A viral infection Continue supportive care.  Chest x-ray with no acute cardiopulmonary disease process.  Influenza A viral PCR positive.  COVID/RSV negative. -- Tamiflu 30 mg p.o. twice daily x 5 days, last day today   Paroxysmal atrial fibrillation Rate controlled, not on any rate controlling medications at home. -- Eliquis twice daily   Diarrhea secondary to norovirus gastroenteritis Maintain enteric precautions.  Norovirus gastroenteritis.  C. difficile only positive for antigen.  Seems to have improved   Essential hypertension -- Continue lisinopril, hold HCTZ.  IV as needed ordered   Dementia --Delirium precautions.  Continue home Aricept and Zoloft   Anxiety/depression: -- Sertraline 100 mg p.o. daily  Hypokalemia -  Repletion   Generalized weakness/fatigue/deconditioning:  Patient presenting from home with progressive weakness in the setting of acute viral influenza as above. --PT/OT evaluation: SNF. Improving therefore repeat PT/OT     DVT prophylaxis:  apixaban (ELIQUIS) tablet 5 mg    Code Status: Full Code Family Communication: Son is at bedside   Disposition Plan:  SNF placement   Consultants:  None   Procedures:  None   Antimicrobials:  Tamiflu    Subjective: Doing ok no complaints.   Examination: Constitutional: Not in acute distress Respiratory: Clear to auscultation bilaterally Cardiovascular: Normal sinus rhythm, no rubs Abdomen: Nontender nondistended good bowel sounds Musculoskeletal: No edema noted Skin: No rashes seen Neurologic: CN 2-12 grossly intact.  And nonfocal Psychiatric: Poor judgment and insight. Alert and oriented x 2   Objective: Vitals:   05/03/22 1749 05/03/22 2107 05/04/22 0549 05/04/22 0832  BP: (!) 156/93 (!) 157/95 (!) 156/89 (!) 148/82  Pulse: 75 64 (!) 50 63  Resp: 16 16 16 16   Temp: 97.8 F (36.6 C) 98.1 F (36.7 C) (!) 97.5 F (36.4 C) 97.8 F (36.6 C)  TempSrc:      SpO2: 96% 97% 96% 98%  Weight:      Height:        Intake/Output Summary (Last 24 hours) at 05/04/2022 1009 Last data filed at 05/04/2022 0850 Gross per 24 hour  Intake 200 ml  Output 50 ml  Net 150 ml    Filed Weights   04/28/22 1949  Weight: 86.2 kg     Data Reviewed:   CBC: Recent Labs  Lab 04/28/22 1951 04/30/22 0557 05/01/22 0619 05/02/22 0631 05/03/22 0453 05/04/22 0454  WBC 8.0 5.6 6.7 7.3 6.7 7.0  NEUTROABS 5.7  --   --   --   --   --   HGB 13.1 13.3 13.4 12.9* 13.0 12.3*  HCT 40.7 40.8 41.3 39.7 39.6 37.5*  MCV 83.7 82.9 82.8 83.6 83.4 81.7  PLT 237 209 241 261 263 259   Basic Metabolic Panel: Recent Labs  Lab 04/30/22 0557 05/01/22 0619 05/02/22 0631 05/03/22 0453 05/04/22 0454  NA 136 141 139 141 139  K 3.4* 3.6 3.5 3.3* 3.5   CL 105 103 104 105 105  CO2 27 28 29 27 26   GLUCOSE 87 94 90 91 87  BUN 29* 33* 33* 32* 28*  CREATININE 1.47* 1.52* 1.48* 1.31* 1.31*  CALCIUM 8.6* 8.9 8.8* 8.9 8.6*  MG  --  2.3 2.1 2.2 2.2   GFR: Estimated Creatinine Clearance: 46.1 mL/min (A) (by C-G formula based on SCr of 1.31 mg/dL (H)). Liver Function Tests: Recent Labs  Lab 04/28/22 1951  AST 23  ALT 15  ALKPHOS 73  BILITOT 0.7  PROT 7.0  ALBUMIN 3.3*   No results for input(s): "LIPASE", "AMYLASE" in the last 168 hours. No results for input(s): "AMMONIA" in the last 168 hours. Coagulation Profile: Recent Labs  Lab 04/28/22 1951  INR 1.5*   Cardiac Enzymes: No results for input(s): "CKTOTAL", "CKMB", "CKMBINDEX", "TROPONINI" in the last 168 hours. BNP (last 3 results) No results for input(s): "PROBNP" in the last 8760 hours. HbA1C: No results for input(s): "HGBA1C" in the last 72 hours. CBG: No results for input(s): "GLUCAP" in the last 168 hours. Lipid Profile: No results for input(s): "CHOL", "HDL", "LDLCALC", "TRIG", "CHOLHDL", "LDLDIRECT" in the last 72 hours. Thyroid Function Tests: No results for input(s): "TSH", "T4TOTAL", "FREET4", "T3FREE", "THYROIDAB" in the last 72 hours. Anemia Panel: No results for input(s): "VITAMINB12", "FOLATE", "FERRITIN", "TIBC", "IRON", "RETICCTPCT" in the last 72 hours. Sepsis Labs: Recent Labs  Lab 04/28/22 2027 04/30/22 0557  PROCALCITON  --  <0.10  LATICACIDVEN 1.4  --     Recent Results (from the past 240 hour(s))  Culture, blood (Routine x 2)     Status: None   Collection Time: 04/28/22  8:27 PM   Specimen: BLOOD  Result Value Ref Range Status   Specimen Description BLOOD LEFT ANTECUBITAL  Final   Special Requests   Final    BOTTLES DRAWN AEROBIC AND ANAEROBIC Blood Culture adequate volume   Culture   Final    NO GROWTH 5 DAYS Performed at [facility], [address] [place], [place] [address]    Report Status 05/03/2022 FINAL  Final  Resp  panel by RT-PCR (RSV, Flu A&B, Covid) Anterior Nasal Swab     Status: Abnormal   Collection Time: 04/28/22  8:27 PM   Specimen: Anterior Nasal Swab  Result Value Ref Range Status   SARS Coronavirus 2 by RT PCR NEGATIVE NEGATIVE Final    Comment: (NOTE) SARS-CoV-2 target nucleic acids are NOT DETECTED.  The SARS-CoV-2 RNA is generally detectable in upper respiratory specimens during the acute phase of infection. The lowest concentration of SARS-CoV-2 viral copies this assay can detect is 138 copies/mL. A negative result does not preclude SARS-Cov-2 infection and should not be used as the sole basis for treatment or other patient management decisions. A negative result may occur with  improper specimen collection/handling, submission of specimen other than nasopharyngeal swab, presence of viral mutation(s) within the areas targeted by this assay, and inadequate number of viral copies(<138 copies/mL). A negative result must be combined  with clinical observations, patient history, and epidemiological information. The expected result is Negative.  Fact Sheet for Patients:  BloggerCourse.com  Fact Sheet for Healthcare Providers:  SeriousBroker.it  This test is no t yet approved or cleared by the [place] FDA and  has been authorized for detection and/or diagnosis of SARS-CoV-2 by FDA under an Emergency Use Authorization (EUA). This EUA will remain  in effect (meaning this test can be used) for the duration of the COVID-19 declaration under Section 564(b)(1) of the Act, 21 U.S.C.section 360bbb-3(b)(1), unless the authorization is terminated  or revoked sooner.       Influenza A by PCR POSITIVE (A) NEGATIVE Final   Influenza B by PCR NEGATIVE NEGATIVE Final    Comment: (NOTE) The Xpert Xpress SARS-CoV-2/FLU/RSV plus assay is intended as an aid in the diagnosis of influenza from Nasopharyngeal swab specimens and should not be used  as a sole basis for treatment. Nasal washings and aspirates are unacceptable for Xpert Xpress SARS-CoV-2/FLU/RSV testing.  Fact Sheet for Patients: BloggerCourse.com  Fact Sheet for Healthcare Providers: SeriousBroker.it  This test is not yet approved or cleared by the [place] FDA and has been authorized for detection and/or diagnosis of SARS-CoV-2 by FDA under an Emergency Use Authorization (EUA). This EUA will remain in effect (meaning this test can be used) for the duration of the COVID-19 declaration under Section 564(b)(1) of the Act, 21 U.S.C. section 360bbb-3(b)(1), unless the authorization is terminated or revoked.     Resp Syncytial Virus by PCR NEGATIVE NEGATIVE Final    Comment: (NOTE) Fact Sheet for Patients: BloggerCourse.com  Fact Sheet for Healthcare Providers: SeriousBroker.it  This test is not yet approved or cleared by the [place] FDA and has been authorized for detection and/or diagnosis of SARS-CoV-2 by FDA under an Emergency Use Authorization (EUA). This EUA will remain in effect (meaning this test can be used) for the duration of the COVID-19 declaration under Section 564(b)(1) of the Act, 21 U.S.C. section 360bbb-3(b)(1), unless the authorization is terminated or revoked.  Performed at [facility], [address] [place], [place] [address]   Culture, blood (Routine x 2)     Status: None   Collection Time: 04/29/22  3:53 AM   Specimen: BLOOD  Result Value Ref Range Status   Specimen Description BLOOD RIGHT FA  Final   Special Requests   Final    BOTTLES DRAWN AEROBIC AND ANAEROBIC Blood Culture results may not be optimal due to an excessive volume of blood received in culture bottles   Culture   Final    NO GROWTH 5 DAYS Performed at [facility], [address] [place], [place] [address]    Report Status 05/04/2022  FINAL  Final  Gastrointestinal Panel by PCR , Stool     Status: Abnormal   Collection Time: 04/29/22  2:34 PM   Specimen: Stool  Result Value Ref Range Status   Campylobacter species NOT DETECTED NOT DETECTED Final   Plesimonas shigelloides NOT DETECTED NOT DETECTED Final   Salmonella species NOT DETECTED NOT DETECTED Final   Yersinia enterocolitica NOT DETECTED NOT DETECTED Final   Vibrio species NOT DETECTED NOT DETECTED Final   Vibrio cholerae NOT DETECTED NOT DETECTED Final   Enteroaggregative E coli (EAEC) NOT DETECTED NOT DETECTED Final   Enteropathogenic E coli (EPEC) NOT DETECTED NOT DETECTED Final   Enterotoxigenic E coli (ETEC) NOT DETECTED NOT DETECTED Final   Shiga like toxin producing E coli (STEC) NOT DETECTED NOT  DETECTED Final   Shigella/Enteroinvasive E coli (EIEC) NOT DETECTED NOT DETECTED Final   Cryptosporidium NOT DETECTED NOT DETECTED Final   Cyclospora cayetanensis NOT DETECTED NOT DETECTED Final   Entamoeba histolytica NOT DETECTED NOT DETECTED Final   Giardia lamblia NOT DETECTED NOT DETECTED Final   Adenovirus F40/41 NOT DETECTED NOT DETECTED Final   Astrovirus NOT DETECTED NOT DETECTED Final   Norovirus GI/GII DETECTED (A) NOT DETECTED Final    Comment: RESULT CALLED TO, READ BACK BY AND VERIFIED WITH: ASHLEY RAMIREZ AT 1623 ON 04/30/22 BY SS    Rotavirus A NOT DETECTED NOT DETECTED Final   Sapovirus (I, II, IV, and V) NOT DETECTED NOT DETECTED Final    Comment: Performed at [facility], [address] [place], [place] [address]  C Difficile Quick Screen w PCR reflex     Status: Abnormal   Collection Time: 04/29/22  5:16 PM   Specimen: STOOL  Result Value Ref Range Status   C Diff antigen POSITIVE (A) NEGATIVE Final   C Diff toxin NEGATIVE NEGATIVE Final   C Diff interpretation Results are indeterminate. See PCR results.  Final    Comment: Performed at [facility], [address] [place], [place] [address]  C. Diff by PCR, Reflexed      Status: None   Collection Time: 04/29/22  5:16 PM  Result Value Ref Range Status   Toxigenic C. Difficile by PCR NEGATIVE NEGATIVE Final    Comment: Patient is colonized with non toxigenic C. difficile. May not need treatment unless significant symptoms are present. Performed at [facility], [address] [place], [place] [address]          Radiology Studies: No results found.      Scheduled Meds:  apixaban  5 mg Oral BID   donepezil  10 mg Oral QHS   lisinopril  20 mg Oral BID   multivitamin with minerals  1 tablet Oral Daily   sertraline  100 mg Oral Daily   Continuous Infusions:     LOS: 3 days   Time spent= 35 mins    Catlin Doria Arsenio Loader, MD [facility]  If 7PM-7AM, please contact night-coverage  05/04/2022, 10:09 AM

## 2022-05-04 NOTE — Progress Notes (Signed)
Physical Therapy Treatment Patient Details Name: Jim Blake MRN: [id number] DOB: 02-Dec-1935 Today's Date: 05/04/2022   History of Present Illness 87 y.o. Caucasian male with medical history significant for atrial fibrillation, hypertension and vitamin B12 deficiency, presented to the emergency room with acute onset of cough over the last 4 days which has been occasionally productive of clear sputum without dyspnea or wheezing however with altered mental status with worsening confusion and disorientation per his family.  He did not have any reported fever or chills.  No dysuria, oliguria or hematuria or flank pain currently.  He was treated for a UTI before Christmas with p.o. Cipro.  He was significantly weak today he could not walk. He is positive for flu A.    PT Comments    Pt in chair.  Wife in attendance.  Stated he did need help eating today but yesterday daughter stated he managed on his own.  Pt is able to stand with min guard and walk 20' x 2 in room and opts to sit each time he returned to chair.  On 3rd attempt he is asked and does do a second lap before sitting before fatigue.    Discussed at length discharge plan with pt and wife.  Pt stated he wishes to go home but at the same time does agree he is a bit unsteady walking.  Wife does voice concerns about him falling and does seem to want rehab.  SNF remains recommended at this time as pt is at high risk for falls especially if he walks on his own and his wife would be at risk for injury if she did try to assist him from falling.  He was becoming progressively weaker at home and was given a standard walker for home.  RW was used today and I did feel it was better for him as when he picks it up he does have some instability.  Wife stated he prefers without wheels.  Pt and wife left to discuss.    If pt and wife do decide to discharge home, he would be well served with wheels for his standard walker, a wheelchair as he has a long ramp in/out of  home and would struggle with daily household ambulation distances and safety.  Pt could be wheelchair level with +1 for transfers and progressing gait with other family members and HHPT/OT.  But at this time, pt is progressing daily with therapy sessions and would be well served with a rehab stay to increase success at home.   Patient suffers from general weakness which impairs his/her ability to perform daily activities like toileting, feeding, dressing, grooming, bathing in the home. A cane, walker, crutch will not resolve the patient's issue with performing activities of daily living. A lightweight wheelchair and cushion is required/recommended and will allow patient to safely perform daily activities.   Patient can safely propel the wheelchair in the home or has a caregiver who can provide assistance.     Recommendations for follow up therapy are one component of a multi-disciplinary discharge planning process, led by the attending physician.  Recommendations may be updated based on patient status, additional functional criteria and insurance authorization.  Follow Up Recommendations  Skilled nursing-short term rehab (<3 hours/day)  See above.     Assistance Recommended at Discharge Frequent or constant Supervision/Assistance  Patient can return home with the following A little help with walking and/or transfers;A little help with bathing/dressing/bathroom;Assistance with feeding;Assistance with cooking/housework;Direct supervision/assist for financial management;Direct supervision/assist for  medications management;Assist for transportation;Help with stairs or ramp for entrance   Equipment Recommendations  Rolling walker (2 wheels);BSC/3in1;Wheelchair (measurements PT);Wheelchair cushion (measurements PT)    Recommendations for Other Services       Precautions / Restrictions Precautions Precautions: Fall Restrictions Weight Bearing Restrictions: No     Mobility  Bed Mobility                General bed mobility comments: Pt in recliner at start/end of session.    Transfers Overall transfer level: Needs assistance Equipment used: Rolling walker (2 wheels) Transfers: Sit to/from Stand Sit to Stand: Min guard                Ambulation/Gait Ambulation/Gait assistance: Min Web designer (Feet): 50 Feet Assistive device: Rolling walker (2 wheels) Gait Pattern/deviations: Step-to pattern Gait velocity: decreased     General Gait Details: 20' x 2 50' x 1   Stairs             Wheelchair Mobility    Modified Rankin (Stroke Patients Only)       Balance   Sitting-balance support: Feet supported Sitting balance-Leahy Scale: Good     Standing balance support: Bilateral upper extremity supported Standing balance-Leahy Scale: Fair Standing balance comment: +1 assist for safety due to balance deficits                            Cognition Arousal/Alertness: Awake/alert Behavior During Therapy: WFL for tasks assessed/performed Overall Cognitive Status: Impaired/Different from baseline                                          Exercises      General Comments        Pertinent Vitals/Pain Pain Assessment Pain Assessment: No/denies pain    Home Living                          Prior Function            PT Goals (current goals can now be found in the care plan section) Progress towards PT goals: Progressing toward goals    Frequency    Min 2X/week      PT Plan Current plan remains appropriate    Co-evaluation              AM-PAC PT "6 Clicks" Mobility   Outcome Measure  Help needed turning from your back to your side while in a flat bed without using bedrails?: A Little Help needed moving from lying on your back to sitting on the side of a flat bed without using bedrails?: A Little Help needed moving to and from a bed to a chair (including a wheelchair)?: A  Little Help needed standing up from a chair using your arms (e.g., wheelchair or bedside chair)?: A Little Help needed to walk in hospital room?: A Little Help needed climbing 3-5 steps with a railing? : A Lot 6 Click Score: 17    End of Session Equipment Utilized During Treatment: Gait belt Activity Tolerance: Patient tolerated treatment well Patient left: in chair;with chair alarm set;with family/visitor present Nurse Communication: Mobility status PT Visit Diagnosis: Unsteadiness on feet (R26.81);Muscle weakness (generalized) (M62.81);History of falling (Z91.81);Difficulty in walking, not elsewhere classified (R26.2)     Time: 1610-9604 PT Time Calculation (  min) (ACUTE ONLY): 24 min  Charges:  $Gait Training: 23-37 mins                   Danielle Dess, PTA 05/04/22, 3:17 PM

## 2022-05-05 DIAGNOSIS — J101 Influenza due to other identified influenza virus with other respiratory manifestations: Secondary | ICD-10-CM | POA: Diagnosis not present

## 2022-05-05 LAB — BASIC METABOLIC PANEL
Anion gap: 7 (ref 5–15)
BUN: 25 mg/dL — ABNORMAL HIGH (ref 8–23)
CO2: 27 mmol/L (ref 22–32)
Calcium: 8.6 mg/dL — ABNORMAL LOW (ref 8.9–10.3)
Chloride: 104 mmol/L (ref 98–111)
Creatinine, Ser: 1.34 mg/dL — ABNORMAL HIGH (ref 0.61–1.24)
GFR, Estimated: 52 mL/min — ABNORMAL LOW (ref >60)
Glucose, Bld: 95 mg/dL (ref 70–99)
Potassium: 3.5 mmol/L (ref 3.5–5.1)
Sodium: 138 mmol/L (ref 135–145)

## 2022-05-05 LAB — CBC
HCT: 37 % — ABNORMAL LOW (ref 39.0–52.0)
Hemoglobin: 12 g/dL — ABNORMAL LOW (ref 13.0–17.0)
MCH: 26.8 pg (ref 26.0–34.0)
MCHC: 32.4 g/dL (ref 30.0–36.0)
MCV: 82.8 fL (ref 80.0–100.0)
Platelets: 281 10*3/uL (ref 150–400)
RBC: 4.47 MIL/uL (ref 4.22–5.81)
RDW: 13.4 % (ref 11.5–15.5)
WBC: 6.8 10*3/uL (ref 4.0–10.5)
nRBC: 0 % (ref 0.0–0.2)

## 2022-05-05 LAB — MAGNESIUM: Magnesium: 1.9 mg/dL (ref 1.7–2.4)

## 2022-05-05 NOTE — Progress Notes (Signed)
Occupational Therapy Treatment Patient Details Name: Jim Blake MRN: [id number] DOB: 1935-11-05 Today's Date: 05/05/2022   History of present illness 87 y.o. Caucasian male with medical history significant for atrial fibrillation, hypertension and vitamin B12 deficiency, presented to the emergency room with acute onset of cough over the last 4 days which has been occasionally productive of clear sputum without dyspnea or wheezing however with altered mental status with worsening confusion and disorientation per his family.  He did not have any reported fever or chills.  No dysuria, oliguria or hematuria or flank pain currently.  He was treated for a UTI before Christmas with p.o. Cipro.  He was significantly weak today he could not walk. He is positive for flu A.   OT comments  Pt received seated in recliner with granddaughter in room; tray table in front of him and pt eating breakfast with cues. Appearing alert; willing to work with OT on walking to bathroom (as pt states "I have to pee"). T/f CGA with RW; cues with RW and CGA for mobility to bathroom; pt unable to urinate at toilet after standing for about two minutes CGA, but washed hands at sink with cues and set up and CGA for standing balance. See flowsheet below for further details of session. Left seated in recliner with all needs in reach. Family still deciding SNF vs HHOT/PT. Discussed with granddaughter that pt will need 24/7 supervision and hands-on of capable caregiver at all times during mobility due to fall risk.     Recommendations for follow up therapy are one component of a multi-disciplinary discharge planning process, led by the attending physician.  Recommendations may be updated based on patient status, additional functional criteria and insurance authorization.    Follow Up Recommendations  Skilled nursing-short term rehab (<3 hours/day)     Assistance Recommended at Discharge Frequent or constant Supervision/Assistance   Patient can return home with the following  A little help with walking and/or transfers;A little help with bathing/dressing/bathroom;Help with stairs or ramp for entrance;Assistance with feeding;Assistance with cooking/housework;Assist for transportation;Direct supervision/assist for financial management;Direct supervision/assist for medications management   Equipment Recommendations  None recommended by OT    Recommendations for Other Services      Precautions / Restrictions Precautions Precautions: Fall Restrictions Weight Bearing Restrictions: No       Mobility Bed Mobility                    Transfers Overall transfer level: Needs assistance Equipment used: Rolling walker (2 wheels) Transfers: Sit to/from Stand Sit to Stand: Min guard           General transfer comment: pt walked to bathroom (approx 15 feet) and back; needing CGA and visual, verbal, and tactile cues for RW use (pt holding it too far in front); no carryover of RW teaching after a few seconds.     Balance Overall balance assessment: Needs assistance Sitting-balance support: Feet supported Sitting balance-Leahy Scale: Good     Standing balance support: Bilateral upper extremity supported Standing balance-Leahy Scale: Fair Standing balance comment: CGA +1 today for mobility/standing at toilet.                           ADL either performed or assessed with clinical judgement   ADL Overall ADL's : Needs assistance/impaired Eating/Feeding: Set up;Supervision/ safety;Cueing for sequencing Eating/Feeding Details (indicate cue type and reason): Pt eating with granddaughter in room (she cut up food for him  and provided intermittent cue to continue eating). Grooming: Wash/dry hands;Standing;Min guard Grooming Details (indicate cue type and reason): Pt needing cues to use soap and assistance to turn water on/off at sink.                   Toilet Transfer Details (indicate cue  type and reason): Pt standing CGA in front of toilet to try to urinate today.           General ADL Comments: Pt likely to have slight improvement in ADL status when in home environment; granddaughter stated that yesterday pt was asking when he was going home a lot; does not typically have any restlessness when at home. Continues to be a fall risk and OT reiterated to granddaughter that if pt d/c home he will need 24/7 supervision and hands-on CGA with all mobility. Granddaughter acknowledging this recommendation and stating that family can make that work.    Extremity/Trunk Assessment Upper Extremity Assessment Upper Extremity Assessment: Generalized weakness   Lower Extremity Assessment Lower Extremity Assessment: Defer to PT evaluation        Vision       Perception     Praxis      Cognition Arousal/Alertness: Awake/alert Behavior During Therapy: WFL for tasks assessed/performed Overall Cognitive Status: History of cognitive impairments - at baseline                                 General Comments: Alert to self. Pleasant and agreeable. Follows directions with visual and verbal cues. No carryover of teaching for proper RW use.        Exercises      Shoulder Instructions       General Comments      Pertinent Vitals/ Pain       Pain Assessment Pain Assessment: No/denies pain  Home Living                                          Prior Functioning/Environment              Frequency  Min 2X/week        Progress Toward Goals  OT Goals(current goals can now be found in the care plan section)  Progress towards OT goals: Progressing toward goals  Acute Rehab OT Goals Patient Stated Goal: Go home OT Goal Formulation: With patient/family Time For Goal Achievement: 05/13/22 Potential to Achieve Goals: Good ADL Goals Pt Will Perform Grooming: with supervision;standing Pt Will Perform Lower Body Dressing: with  supervision;sit to/from stand Pt Will Transfer to Toilet: with supervision;ambulating Pt Will Perform Toileting - Clothing Manipulation and hygiene: with supervision;sit to/from stand  Plan Discharge plan remains appropriate;Frequency remains appropriate;Other (comment) (discussed d/c recommendation with granddaughter; family might take pt home with HHOT/HHPT; they are still deciding. For safety, STR stay recommended as pt is a high falls risk, although with dementia may perform better in home environment cognitively.)    Co-evaluation                 AM-PAC OT "6 Clicks" Daily Activity     Outcome Measure   Help from another person eating meals?: A Little Help from another person taking care of personal grooming?: A Little Help from another person toileting, which includes using toliet, bedpan, or urinal?: A Little Help  from another person bathing (including washing, rinsing, drying)?: A Little Help from another person to put on and taking off regular upper body clothing?: A Little Help from another person to put on and taking off regular lower body clothing?: A Little 6 Click Score: 18    End of Session Equipment Utilized During Treatment: Rolling walker (2 wheels)  OT Visit Diagnosis: Unsteadiness on feet (R26.81);Muscle weakness (generalized) (M62.81)   Activity Tolerance Patient tolerated treatment well   Patient Left in chair;with family/visitor present (granddaughter in room; tray table in front with the rest of pt's breakfast ready for him.)   Nurse Communication Mobility status        Time: 7903-8333 OT Time Calculation (min): 17 min  Charges: OT General Charges $OT Visit: 1 Visit OT Treatments $Self Care/Home Management : 8-22 mins  Linward Foster, MS, OTR/L   Jim Blake 05/05/2022, 9:46 AM

## 2022-05-05 NOTE — Progress Notes (Signed)
    Durable Medical Equipment  (From admission, onward)           Start     Ordered   05/05/22 1028  For home use only DME Bedside commode  Once       Question:  Patient needs a bedside commode to treat with the following condition  Answer:  Muscle weakness (generalized)   05/05/22 1028   05/05/22 1023  For home use only DME high strength lightweight manual wheelchair with seat cushion  Once       Comments: Patient suffers from weakness which impairs their ability to perform daily activities like bathing, dressing, and feeding in the home.  A cane, crutch, or walker will not resolve  issue with performing activities of daily living. A wheelchair will allow patient to safely perform daily activities.Length of need Lifetime. (THEN ONE OF THESE TWO:) Patient self-propels the wheelchair while engaging in frequent activities such as laundry, meals, and toileting which cannot be performed in a standard or lightweight wheelchair due to the weight of the chair. Accessories: elevating leg rests (ELRs), wheel locks, extensions and anti-tippers.   05/05/22 1023   05/05/22 1013  For home use only DME Walker rolling  Once       Question Answer Comment  Walker: With [place]   Patient needs a walker to treat with the following condition Increased weakness when ambulating      05/05/22 1012           Patient is not able to walk the distance required to go the bathroom, or he/she is unable to safely negotiate stairs required to access the bathroom.  A 3in1 BSC will alleviate this problem   Alysah Carton H. Owens Shark, PT, DPT, NCS 05/05/22, 10:28 AM [phone]

## 2022-05-05 NOTE — TOC Progression Note (Addendum)
Transition of Care [facility]) - Progression Note    Patient Details  Name: Jim Blake MRN: [id number] Date of Birth: 03-21-1936  [facility]) CM/SW Contact  Gerilyn Pilgrim, LCSW Phone Number: 05/05/2022, 9:39 AM  Clinical Narrative:   SW spoke with daughter Jim Blake who states they would like to take the patient home with [facility]. She reports no preference in agency. She states they have a wheelchair, 3 in 1, and a RW. SW will work on securing [facility] for patient.   10:00am Granddaughter reports that the wheelchair is at another family members house and would like [place] to order this as well as a RW since the wheels are broken. SW to order through [employer].   10:41am.   [facility] has accepted for [facility]/OT/AID. SW waiting for MD order for DME.     Expected Discharge Plan and Services                                               Social Determinants of Health (SDOH) Interventions SDOH Screenings   Tobacco Use: High Risk (04/30/2022)    Readmission Risk Interventions     No data to display

## 2022-05-05 NOTE — Care Management Important Message (Signed)
Important Message  Patient Details  Name: Jim Blake MRN: [id number] Date of Birth: 1935-04-30   Medicare Important Message Given:  Yes     Juliann Pulse [place] 05/05/2022, 10:59 AM

## 2022-05-05 NOTE — Progress Notes (Signed)
Physical Therapy Treatment Patient Details Name: Jim Blake MRN: [id number] DOB: 08-03-1935 Today's Date: 05/05/2022   History of Present Illness 87 y.o. Caucasian male with medical history significant for atrial fibrillation, hypertension and vitamin B12 deficiency, presented to the emergency room with acute onset of cough over the last 4 days which has been occasionally productive of clear sputum without dyspnea or wheezing however with altered mental status with worsening confusion and disorientation per his family.  He did not have any reported fever or chills.  No dysuria, oliguria or hematuria or flank pain currently.  He was treated for a UTI before Christmas with p.o. Cipro.  He was significantly weak today he could not walk. He is positive for flu A.    PT Comments    Pt bathing at sink in standing with granddaughter upon arrival.  (RN here),  he is able to participate in bathing with cues and supervision.  Is able to progress gait 120' in hallway with RW and min guard/min assist for cues for walker position and general safety.  Some fatigue noted.  Discussed discharge plan.  At this time, given improvement in mobility and good family supports, updated discharge plan to home with HHPT and additional services as available.  He would still benefit from a wheelchair as walking with his wife remains a concern.  He would be able to transfer in/out and have chair for household mobility when no other assist is available.  Also gait outside and up/down ramp would increase fall risk given his balance and safety deficits.   Discussed with grand daughter and team.   Recommendations for follow up therapy are one component of a multi-disciplinary discharge planning process, led by the attending physician.  Recommendations may be updated based on patient status, additional functional criteria and insurance authorization.  Follow Up Recommendations  Home health PT     Assistance Recommended at Discharge  Frequent or constant Supervision/Assistance  Patient can return home with the following A little help with walking and/or transfers;A little help with bathing/dressing/bathroom;Assistance with cooking/housework;Direct supervision/assist for financial management;Direct supervision/assist for medications management;Assist for transportation;Help with stairs or ramp for entrance   Equipment Recommendations  Rolling walker (2 wheels);BSC/3in1;Wheelchair (measurements PT);Wheelchair cushion (measurements PT)    Recommendations for Other Services       Precautions / Restrictions Precautions Precautions: Fall Restrictions Weight Bearing Restrictions: No     Mobility  Bed Mobility               General bed mobility comments: bathing with grandaughter upon arrival    Transfers Overall transfer level: Needs assistance Equipment used: Rolling walker (2 wheels) Transfers: Sit to/from Stand Sit to Stand: Min guard           General transfer comment: does pull up on walker and needs cues to correct    Ambulation/Gait Ambulation/Gait assistance: Min guard, Min assist Gait Distance (Feet): 110 Feet Assistive device: Rolling walker (2 wheels) Gait Pattern/deviations: Step-through pattern Gait velocity: decreased     General Gait Details: cues for walker position and general safety but overall improved today   Stairs             Wheelchair Mobility    Modified Rankin (Stroke Patients Only)       Balance Overall balance assessment: Needs assistance Sitting-balance support: Feet supported Sitting balance-Leahy Scale: Good     Standing balance support: No upper extremity supported Standing balance-Leahy Scale: Fair Standing balance comment: able to stand and bathe with supervision  Cognition Arousal/Alertness: Awake/alert Behavior During Therapy: WFL for tasks assessed/performed Overall Cognitive Status: History of  cognitive impairments - at baseline                                          Exercises      General Comments        Pertinent Vitals/Pain Pain Assessment Pain Assessment: No/denies pain    Home Living                          Prior Function            PT Goals (current goals can now be found in the care plan section) Progress towards PT goals: Progressing toward goals    Frequency    Min 2X/week      PT Plan Discharge plan needs to be updated    Co-evaluation              AM-PAC PT "6 Clicks" Mobility   Outcome Measure  Help needed turning from your back to your side while in a flat bed without using bedrails?: A Little Help needed moving from lying on your back to sitting on the side of a flat bed without using bedrails?: A Little Help needed moving to and from a bed to a chair (including a wheelchair)?: A Little Help needed standing up from a chair using your arms (e.g., wheelchair or bedside chair)?: A Little Help needed to walk in hospital room?: A Little Help needed climbing 3-5 steps with a railing? : A Lot 6 Click Score: 17    End of Session Equipment Utilized During Treatment: Gait belt Activity Tolerance: Patient tolerated treatment well Patient left: in chair;with chair alarm set;with family/visitor present Nurse Communication: Mobility status PT Visit Diagnosis: Unsteadiness on feet (R26.81);Muscle weakness (generalized) (M62.81);History of falling (Z91.81);Difficulty in walking, not elsewhere classified (R26.2)     Time: 2458-0998 PT Time Calculation (min) (ACUTE ONLY): 13 min  Charges:  $Gait Training: 8-22 mins                  Danielle Dess, PTA 05/05/22, 10:17 AM

## 2022-05-05 NOTE — TOC Transition Note (Signed)
Transition of Care [facility]) - CM/SW Discharge Note   Patient Details  Name: Jim Blake MRN: [id number] Date of Birth: 09-24-1935  Transition of Care [facility]) CM/SW Contact:  Gerilyn Pilgrim, LCSW Phone Number: 05/05/2022, 10:48 AM   Clinical Narrative:     Orders are in for wheelchair with cushion and RW. [facility] accepted pt for [facility] PT/OT/AID. No additional needs at this time. DME to be delivered to the room. CSW signing off.   Final next level of care: Home w Home Health Services Barriers to Discharge: Barriers Resolved   Patient Goals and CMS Choice CMS Medicare.gov Compare Post Acute Care list provided to::  (daughter wendy) Choice offered to / list presented to : Adult Children  Discharge Placement                      Patient and family notified of of transfer: 05/05/22  Discharge Plan and Services Additional resources added to the After Visit Summary for                  DME Arranged: Lightweight manual wheelchair with seat cushion, Walker rolling DME Agency: [employer] Date DME Agency Contacted: 05/05/22 Time DME Agency Contacted: 1000 Representative spoke with at DME Agency: [place]: PT, OT, Nurse's Aide [place] Agency: [place] ([place]) Date [place]: 05/05/22 Time [place]: 0900 Representative spoke with at [place]: [place] ([place]) Interventions SDOH Screenings   Tobacco Use: High Risk (04/30/2022)     Readmission Risk Interventions     No data to display

## 2022-05-05 NOTE — Progress Notes (Signed)
RW and wheelchair delivered to room. Patient assisted with getting dressed. No further needs.

## 2022-05-05 NOTE — Discharge Summary (Signed)
Physician Discharge Summary  Man Effertz OVZ:[id number] DOB: 11/19/1935 DOA: 04/29/2022  PCP: Dorothey Baseman, MD  Admit date: 04/29/2022 Discharge date: 05/05/2022  Admitted From: Home Disposition: Home with home health  Recommendations for Outpatient Follow-up:  Follow up with PCP in 1-2 weeks Please obtain BMP/CBC in one week your next doctors visit.    Home Health-PT/OT, aide Equipment/Devices:: Wheelchair, walker with wheels Discharge Condition: Stable CODE STATUS: Full code Diet recommendation: Regular  Brief/Interim Summary:  87 y.o. male with past medical history significant for paroxysmal atrial fibrillation, essential hypertension, B12 deficiency, dementia, depression/anxiety who presented to [facility] ED on 1/1 via EMS from home with confusion, cough over the last 4 days.  Recently diagnosed with urinary tract infection 8 days prior to admission treated with oral ciprofloxacin.  Patient became progressively more weak with inability to walk due to his progressive symptoms. Influenza A PCR positive. COVID-19 PCR negative. RSV PCR negative. Urinalysis unrevealing. EKG with atrial fibrillation, rate controlled. Chest x-ray with no acute cardiopulmonary disease process. Blood cultures x 2 obtained.  Eventually stool studies were positive for norovirus.  PT has recommended SNF.  But eventually family opted to take patient home with home health.     Assessment & Plan:  Principal Problem:   Influenza A Active Problems:   Essential hypertension   Paroxysmal atrial fibrillation (HCC)   Dementia with behavioral disturbance (HCC)   Depression   Acute encephalopathy   Generalized weakness    Acute hypoxic respiratory failure, POA-resolved Influenza A viral infection Continue supportive care.  Chest x-ray with no acute cardiopulmonary disease process.  Influenza A viral PCR positive.  COVID/RSV negative. -- Completed 5-day course of Tamiflu in the hospital.   Paroxysmal atrial  fibrillation Rate controlled, not on any rate controlling medications at home. -- Eliquis twice daily   Diarrhea secondary to norovirus gastroenteritis Maintain enteric precautions.  Norovirus gastroenteritis.  C. difficile only positive for antigen.  Seems to have improved   Essential hypertension -- Resume home regimen   Dementia --Delirium precautions.  Continue home Aricept and Zoloft   Anxiety/depression: -- Sertraline 100 mg p.o. daily   Hypokalemia - Repletion   Generalized weakness/fatigue/deconditioning:  Patient presenting from home with progressive weakness in the setting of acute viral influenza as above. --PT/OT evaluation: SNF.  But family opting to take patient home with home health.  Arrangements made.         Discharge Diagnoses:  Principal Problem:   Influenza A Active Problems:   Essential hypertension   Paroxysmal atrial fibrillation (HCC)   Dementia with behavioral disturbance (HCC)   Depression   Acute encephalopathy   Generalized weakness     Subjective: Feeling well, sitting up in the recliner.  No complaints.  Granddaughter at bedside.  Discharge Exam: Vitals:   05/05/22 0542 05/05/22 0846  BP: 126/78 132/71  Pulse: 64 92  Resp: 16 18  Temp: 97.7 F (36.5 C) 97.9 F (36.6 C)  SpO2: 98% 97%   Vitals:   05/04/22 2017 05/04/22 2020 05/05/22 0542 05/05/22 0846  BP: (!) 156/66 (!) 156/66 126/78 132/71  Pulse:  65 64 92  Resp:  16 16 18   Temp:  98.4 F (36.9 C) 97.7 F (36.5 C) 97.9 F (36.6 C)  TempSrc:  Oral Oral   SpO2:  97% 98% 97%  Weight:      Height:        General: Pt is alert, awake, not in acute distress.  Elderly frail Cardiovascular: RRR, S1/S2 +, no  rubs, no gallops Respiratory: CTA bilaterally, no wheezing, no rhonchi Abdominal: Soft, NT, ND, bowel sounds + Extremities: no edema, no cyanosis  Discharge Instructions   Allergies as of 05/05/2022   No Known Allergies      Medication List     STOP  taking these medications    ciprofloxacin 500 MG tablet Commonly known as: CIPRO       TAKE these medications    apixaban 5 MG Tabs tablet Commonly known as: ELIQUIS Take 5 mg by mouth 2 (two) times daily.   donepezil 10 MG tablet Commonly known as: ARICEPT Take 1 tablet by mouth at bedtime.   lisinopril-hydrochlorothiazide 20-12.5 MG tablet Commonly known as: ZESTORETIC Take 1 tablet by mouth 2 (two) times daily.   multivitamin with minerals tablet Take 1 tablet by mouth daily.   Potassium Chloride ER 20 MEQ Tbcr Take 1 tablet by mouth daily.   sertraline 100 MG tablet Commonly known as: ZOLOFT Take 100 mg by mouth daily.               Durable Medical Equipment  (From admission, onward)           Start     Ordered   05/05/22 1028  For home use only DME Bedside commode  Once       Question:  Patient needs a bedside commode to treat with the following condition  Answer:  Muscle weakness (generalized)   05/05/22 1028   05/05/22 1023  For home use only DME high strength lightweight manual wheelchair with seat cushion  Once       Comments: Patient suffers from weakness which impairs their ability to perform daily activities like bathing, dressing, and feeding in the home.  A cane, crutch, or walker will not resolve  issue with performing activities of daily living. A wheelchair will allow patient to safely perform daily activities.Length of need Lifetime. (THEN ONE OF THESE TWO:) Patient self-propels the wheelchair while engaging in frequent activities such as laundry, meals, and toileting which cannot be performed in a standard or lightweight wheelchair due to the weight of the chair. Accessories: elevating leg rests (ELRs), wheel locks, extensions and anti-tippers.   05/05/22 1023   05/05/22 1013  For home use only DME Walker rolling  Once       Question Answer Comment  Walker: With 5 Inch Wheels   Patient needs a walker to treat with the following condition  Increased weakness when ambulating      05/05/22 1012            No Known Allergies  You were cared for by a hospitalist during your hospital stay. If you have any questions about your discharge medications or the care you received while you were in the hospital after you are discharged, you can call the unit and asked to speak with the hospitalist on call if the hospitalist that took care of you is not available. Once you are discharged, your primary care physician will handle any further medical issues. Please note that no refills for any discharge medications will be authorized once you are discharged, as it is imperative that you return to your primary care physician (or establish a relationship with a primary care physician if you do not have one) for your aftercare needs so that they can reassess your need for medications and monitor your lab values.   Procedures/Studies: DG Chest Port 1 View  Result Date: 04/28/2022 CLINICAL DATA:  Altered mental status  EXAM: PORTABLE CHEST 1 VIEW COMPARISON:  10/10/2020 FINDINGS: Transverse diameter of heart is increased. There is poor inspiration. There are no signs of alveolar pulmonary edema or focal pulmonary consolidation. There is no pleural effusion or pneumothorax. Degenerative changes are noted in both shoulders, more so on the right side. IMPRESSION: There are no signs of pulmonary edema or focal pulmonary consolidation. Poor inspiration. Electronically Signed   By: Elmer Picker M.D.   On: 04/28/2022 21:03     The results of significant diagnostics from this hospitalization (including imaging, microbiology, ancillary and laboratory) are listed below for reference.     Microbiology: Recent Results (from the past 240 hour(s))  Culture, blood (Routine x 2)     Status: None   Collection Time: 04/28/22  8:27 PM   Specimen: BLOOD  Result Value Ref Range Status   Specimen Description BLOOD LEFT ANTECUBITAL  Final   Special Requests    Final    BOTTLES DRAWN AEROBIC AND ANAEROBIC Blood Culture adequate volume   Culture   Final    NO GROWTH 5 DAYS Performed at [facility], [address] [place], [place] [address]    Report Status 05/03/2022 FINAL  Final  Resp panel by RT-PCR (RSV, Flu A&B, Covid) Anterior Nasal Swab     Status: Abnormal   Collection Time: 04/28/22  8:27 PM   Specimen: Anterior Nasal Swab  Result Value Ref Range Status   SARS Coronavirus 2 by RT PCR NEGATIVE NEGATIVE Final    Comment: (NOTE) SARS-CoV-2 target nucleic acids are NOT DETECTED.  The SARS-CoV-2 RNA is generally detectable in upper respiratory specimens during the acute phase of infection. The lowest concentration of SARS-CoV-2 viral copies this assay can detect is 138 copies/mL. A negative result does not preclude SARS-Cov-2 infection and should not be used as the sole basis for treatment or other patient management decisions. A negative result may occur with  improper specimen collection/handling, submission of specimen other than nasopharyngeal swab, presence of viral mutation(s) within the areas targeted by this assay, and inadequate number of viral copies(<138 copies/mL). A negative result must be combined with clinical observations, patient history, and epidemiological information. The expected result is Negative.  Fact Sheet for Patients:  EntrepreneurPulse.com.au  Fact Sheet for Healthcare Providers:  IncredibleEmployment.be  This test is no t yet approved or cleared by the [place] FDA and  has been authorized for detection and/or diagnosis of SARS-CoV-2 by FDA under an Emergency Use Authorization (EUA). This EUA will remain  in effect (meaning this test can be used) for the duration of the COVID-19 declaration under Section 564(b)(1) of the Act, 21 U.S.C.section 360bbb-3(b)(1), unless the authorization is terminated  or revoked sooner.       Influenza A by PCR  POSITIVE (A) NEGATIVE Final   Influenza B by PCR NEGATIVE NEGATIVE Final    Comment: (NOTE) The Xpert Xpress SARS-CoV-2/FLU/RSV plus assay is intended as an aid in the diagnosis of influenza from Nasopharyngeal swab specimens and should not be used as a sole basis for treatment. Nasal washings and aspirates are unacceptable for Xpert Xpress SARS-CoV-2/FLU/RSV testing.  Fact Sheet for Patients: EntrepreneurPulse.com.au  Fact Sheet for Healthcare Providers: IncredibleEmployment.be  This test is not yet approved or cleared by the [place] FDA and has been authorized for detection and/or diagnosis of SARS-CoV-2 by FDA under an Emergency Use Authorization (EUA). This EUA will remain in effect (meaning this test can be used) for the duration of the COVID-19 declaration under  Section 564(b)(1) of the Act, 21 U.S.C. section 360bbb-3(b)(1), unless the authorization is terminated or revoked.     Resp Syncytial Virus by PCR NEGATIVE NEGATIVE Final    Comment: (NOTE) Fact Sheet for Patients: BloggerCourse.com  Fact Sheet for Healthcare Providers: SeriousBroker.it  This test is not yet approved or cleared by the [place] FDA and has been authorized for detection and/or diagnosis of SARS-CoV-2 by FDA under an Emergency Use Authorization (EUA). This EUA will remain in effect (meaning this test can be used) for the duration of the COVID-19 declaration under Section 564(b)(1) of the Act, 21 U.S.C. section 360bbb-3(b)(1), unless the authorization is terminated or revoked.  Performed at [facility], [address] [place], [place] [address]   Culture, blood (Routine x 2)     Status: None   Collection Time: 04/29/22  3:53 AM   Specimen: BLOOD  Result Value Ref Range Status   Specimen Description BLOOD RIGHT FA  Final   Special Requests   Final    BOTTLES DRAWN AEROBIC AND ANAEROBIC  Blood Culture results may not be optimal due to an excessive volume of blood received in culture bottles   Culture   Final    NO GROWTH 5 DAYS Performed at [facility], [address] [place], [place] [address]    Report Status 05/04/2022 FINAL  Final  Gastrointestinal Panel by PCR , Stool     Status: Abnormal   Collection Time: 04/29/22  2:34 PM   Specimen: Stool  Result Value Ref Range Status   Campylobacter species NOT DETECTED NOT DETECTED Final   Plesimonas shigelloides NOT DETECTED NOT DETECTED Final   Salmonella species NOT DETECTED NOT DETECTED Final   Yersinia enterocolitica NOT DETECTED NOT DETECTED Final   Vibrio species NOT DETECTED NOT DETECTED Final   Vibrio cholerae NOT DETECTED NOT DETECTED Final   Enteroaggregative E coli (EAEC) NOT DETECTED NOT DETECTED Final   Enteropathogenic E coli (EPEC) NOT DETECTED NOT DETECTED Final   Enterotoxigenic E coli (ETEC) NOT DETECTED NOT DETECTED Final   Shiga like toxin producing E coli (STEC) NOT DETECTED NOT DETECTED Final   Shigella/Enteroinvasive E coli (EIEC) NOT DETECTED NOT DETECTED Final   Cryptosporidium NOT DETECTED NOT DETECTED Final   Cyclospora cayetanensis NOT DETECTED NOT DETECTED Final   Entamoeba histolytica NOT DETECTED NOT DETECTED Final   Giardia lamblia NOT DETECTED NOT DETECTED Final   Adenovirus F40/41 NOT DETECTED NOT DETECTED Final   Astrovirus NOT DETECTED NOT DETECTED Final   Norovirus GI/GII DETECTED (A) NOT DETECTED Final    Comment: RESULT CALLED TO, READ BACK BY AND VERIFIED WITH: ASHLEY RAMIREZ AT 1623 ON 04/30/22 BY SS    Rotavirus A NOT DETECTED NOT DETECTED Final   Sapovirus (I, II, IV, and V) NOT DETECTED NOT DETECTED Final    Comment: Performed at [facility], [address] [place], [place] [address]  C Difficile Quick Screen w PCR reflex     Status: Abnormal   Collection Time: 04/29/22  5:16 PM   Specimen: STOOL  Result Value Ref Range Status   C Diff antigen  POSITIVE (A) NEGATIVE Final   C Diff toxin NEGATIVE NEGATIVE Final   C Diff interpretation Results are indeterminate. See PCR results.  Final    Comment: Performed at [facility], [address] [place], [place] [address]  C. Diff by PCR, Reflexed     Status: None   Collection Time: 04/29/22  5:16 PM  Result Value  Ref Range Status   Toxigenic C. Difficile by PCR NEGATIVE NEGATIVE Final    Comment: Patient is colonized with non toxigenic C. difficile. May not need treatment unless significant symptoms are present. Performed at [facility], [address] [place], [place] [address]      Labs: BNP (last 3 results) Recent Labs    04/30/22 0557  BNP 316.1*   Basic Metabolic Panel: Recent Labs  Lab 05/01/22 0619 05/02/22 0631 05/03/22 0453 05/04/22 0454 05/05/22 0515  NA 141 139 141 139 138  K 3.6 3.5 3.3* 3.5 3.5  CL 103 104 105 105 104  CO2 28 29 27 26 27   GLUCOSE 94 90 91 87 95  BUN 33* 33* 32* 28* 25*  CREATININE 1.52* 1.48* 1.31* 1.31* 1.34*  CALCIUM 8.9 8.8* 8.9 8.6* 8.6*  MG 2.3 2.1 2.2 2.2 1.9   Liver Function Tests: Recent Labs  Lab 04/28/22 1951  AST 23  ALT 15  ALKPHOS 73  BILITOT 0.7  PROT 7.0  ALBUMIN 3.3*   No results for input(s): "LIPASE", "AMYLASE" in the last 168 hours. No results for input(s): "AMMONIA" in the last 168 hours. CBC: Recent Labs  Lab 04/28/22 1951 04/30/22 0557 05/01/22 0619 05/02/22 0631 05/03/22 0453 05/04/22 0454 05/05/22 0515  WBC 8.0   < > 6.7 7.3 6.7 7.0 6.8  NEUTROABS 5.7  --   --   --   --   --   --   HGB 13.1   < > 13.4 12.9* 13.0 12.3* 12.0*  HCT 40.7   < > 41.3 39.7 39.6 37.5* 37.0*  MCV 83.7   < > 82.8 83.6 83.4 81.7 82.8  PLT 237   < > 241 261 263 269 281   < > = values in this interval not displayed.   Cardiac Enzymes: No results for input(s): "CKTOTAL", "CKMB", "CKMBINDEX", "TROPONINI" in the last 168 hours. BNP: Invalid input(s): "POCBNP" CBG: No results for input(s): "GLUCAP"  in the last 168 hours. D-Dimer No results for input(s): "DDIMER" in the last 72 hours. Hgb A1c No results for input(s): "HGBA1C" in the last 72 hours. Lipid Profile No results for input(s): "CHOL", "HDL", "LDLCALC", "TRIG", "CHOLHDL", "LDLDIRECT" in the last 72 hours. Thyroid function studies No results for input(s): "TSH", "T4TOTAL", "T3FREE", "THYROIDAB" in the last 72 hours.  Invalid input(s): "FREET3" Anemia work up No results for input(s): "VITAMINB12", "FOLATE", "FERRITIN", "TIBC", "IRON", "RETICCTPCT" in the last 72 hours. Urinalysis    Component Value Date/Time   COLORURINE YELLOW (A) 04/29/2022 0505   APPEARANCEUR HAZY (A) 04/29/2022 0505   LABSPEC 1.015 04/29/2022 0505   PHURINE 5.0 04/29/2022 0505   GLUCOSEU NEGATIVE 04/29/2022 0505   HGBUR NEGATIVE 04/29/2022 0505   BILIRUBINUR NEGATIVE 04/29/2022 0505   KETONESUR NEGATIVE 04/29/2022 0505   PROTEINUR NEGATIVE 04/29/2022 0505   NITRITE NEGATIVE 04/29/2022 0505   LEUKOCYTESUR NEGATIVE 04/29/2022 0505   Sepsis Labs Recent Labs  Lab 05/02/22 0631 05/03/22 0453 05/04/22 0454 05/05/22 0515  WBC 7.3 6.7 7.0 6.8   Microbiology Recent Results (from the past 240 hour(s))  Culture, blood (Routine x 2)     Status: None   Collection Time: 04/28/22  8:27 PM   Specimen: BLOOD  Result Value Ref Range Status   Specimen Description BLOOD LEFT ANTECUBITAL  Final   Special Requests   Final    BOTTLES DRAWN AEROBIC AND ANAEROBIC Blood Culture adequate volume   Culture   Final    NO GROWTH 5 DAYS  Performed at [facility], [address] [place], [place] [address]    Report Status 05/03/2022 FINAL  Final  Resp panel by RT-PCR (RSV, Flu A&B, Covid) Anterior Nasal Swab     Status: Abnormal   Collection Time: 04/28/22  8:27 PM   Specimen: Anterior Nasal Swab  Result Value Ref Range Status   SARS Coronavirus 2 by RT PCR NEGATIVE NEGATIVE Final    Comment: (NOTE) SARS-CoV-2 target nucleic acids are NOT  DETECTED.  The SARS-CoV-2 RNA is generally detectable in upper respiratory specimens during the acute phase of infection. The lowest concentration of SARS-CoV-2 viral copies this assay can detect is 138 copies/mL. A negative result does not preclude SARS-Cov-2 infection and should not be used as the sole basis for treatment or other patient management decisions. A negative result may occur with  improper specimen collection/handling, submission of specimen other than nasopharyngeal swab, presence of viral mutation(s) within the areas targeted by this assay, and inadequate number of viral copies(<138 copies/mL). A negative result must be combined with clinical observations, patient history, and epidemiological information. The expected result is Negative.  Fact Sheet for Patients:  BloggerCourse.com  Fact Sheet for Healthcare Providers:  SeriousBroker.it  This test is no t yet approved or cleared by the [place] FDA and  has been authorized for detection and/or diagnosis of SARS-CoV-2 by FDA under an Emergency Use Authorization (EUA). This EUA will remain  in effect (meaning this test can be used) for the duration of the COVID-19 declaration under Section 564(b)(1) of the Act, 21 U.S.C.section 360bbb-3(b)(1), unless the authorization is terminated  or revoked sooner.       Influenza A by PCR POSITIVE (A) NEGATIVE Final   Influenza B by PCR NEGATIVE NEGATIVE Final    Comment: (NOTE) The Xpert Xpress SARS-CoV-2/FLU/RSV plus assay is intended as an aid in the diagnosis of influenza from Nasopharyngeal swab specimens and should not be used as a sole basis for treatment. Nasal washings and aspirates are unacceptable for Xpert Xpress SARS-CoV-2/FLU/RSV testing.  Fact Sheet for Patients: BloggerCourse.com  Fact Sheet for Healthcare Providers: SeriousBroker.it  This test is not  yet approved or cleared by the [place] FDA and has been authorized for detection and/or diagnosis of SARS-CoV-2 by FDA under an Emergency Use Authorization (EUA). This EUA will remain in effect (meaning this test can be used) for the duration of the COVID-19 declaration under Section 564(b)(1) of the Act, 21 U.S.C. section 360bbb-3(b)(1), unless the authorization is terminated or revoked.     Resp Syncytial Virus by PCR NEGATIVE NEGATIVE Final    Comment: (NOTE) Fact Sheet for Patients: BloggerCourse.com  Fact Sheet for Healthcare Providers: SeriousBroker.it  This test is not yet approved or cleared by the [place] FDA and has been authorized for detection and/or diagnosis of SARS-CoV-2 by FDA under an Emergency Use Authorization (EUA). This EUA will remain in effect (meaning this test can be used) for the duration of the COVID-19 declaration under Section 564(b)(1) of the Act, 21 U.S.C. section 360bbb-3(b)(1), unless the authorization is terminated or revoked.  Performed at [facility], [address] [place], [place] [address]   Culture, blood (Routine x 2)     Status: None   Collection Time: 04/29/22  3:53 AM   Specimen: BLOOD  Result Value Ref Range Status   Specimen Description BLOOD RIGHT FA  Final   Special Requests   Final    BOTTLES DRAWN AEROBIC AND ANAEROBIC Blood Culture results may  not be optimal due to an excessive volume of blood received in culture bottles   Culture   Final    NO GROWTH 5 DAYS Performed at [facility], [address] [place], [place] [address]    Report Status 05/04/2022 FINAL  Final  Gastrointestinal Panel by PCR , Stool     Status: Abnormal   Collection Time: 04/29/22  2:34 PM   Specimen: Stool  Result Value Ref Range Status   Campylobacter species NOT DETECTED NOT DETECTED Final   Plesimonas shigelloides NOT DETECTED NOT DETECTED Final   Salmonella  species NOT DETECTED NOT DETECTED Final   Yersinia enterocolitica NOT DETECTED NOT DETECTED Final   Vibrio species NOT DETECTED NOT DETECTED Final   Vibrio cholerae NOT DETECTED NOT DETECTED Final   Enteroaggregative E coli (EAEC) NOT DETECTED NOT DETECTED Final   Enteropathogenic E coli (EPEC) NOT DETECTED NOT DETECTED Final   Enterotoxigenic E coli (ETEC) NOT DETECTED NOT DETECTED Final   Shiga like toxin producing E coli (STEC) NOT DETECTED NOT DETECTED Final   Shigella/Enteroinvasive E coli (EIEC) NOT DETECTED NOT DETECTED Final   Cryptosporidium NOT DETECTED NOT DETECTED Final   Cyclospora cayetanensis NOT DETECTED NOT DETECTED Final   Entamoeba histolytica NOT DETECTED NOT DETECTED Final   Giardia lamblia NOT DETECTED NOT DETECTED Final   Adenovirus F40/41 NOT DETECTED NOT DETECTED Final   Astrovirus NOT DETECTED NOT DETECTED Final   Norovirus GI/GII DETECTED (A) NOT DETECTED Final    Comment: RESULT CALLED TO, READ BACK BY AND VERIFIED WITH: ASHLEY RAMIREZ AT 1623 ON 04/30/22 BY SS    Rotavirus A NOT DETECTED NOT DETECTED Final   Sapovirus (I, II, IV, and V) NOT DETECTED NOT DETECTED Final    Comment: Performed at [facility], [address] [place], [place] [address]  C Difficile Quick Screen w PCR reflex     Status: Abnormal   Collection Time: 04/29/22  5:16 PM   Specimen: STOOL  Result Value Ref Range Status   C Diff antigen POSITIVE (A) NEGATIVE Final   C Diff toxin NEGATIVE NEGATIVE Final   C Diff interpretation Results are indeterminate. See PCR results.  Final    Comment: Performed at [facility], [address] [place], [place] [address]  C. Diff by PCR, Reflexed     Status: None   Collection Time: 04/29/22  5:16 PM  Result Value Ref Range Status   Toxigenic C. Difficile by PCR NEGATIVE NEGATIVE Final    Comment: Patient is colonized with non toxigenic C. difficile. May not need treatment unless significant symptoms are present. Performed at  [facility], [address] [place], [place] [address]      Time coordinating discharge:  I have spent 35 minutes face to face with the patient and on the ward discussing the patients care, assessment, plan and disposition with other care givers. >50% of the time was devoted counseling the patient about the risks and benefits of treatment/Discharge disposition and coordinating care.   SIGNED:   Dimple NanasAnkit Chirag Gearold Wainer, MD  [facility] 05/05/2022, 11:28 AM   If 7PM-7AM, please contact night-coverage

## 2022-08-03 ENCOUNTER — Emergency Department: Payer: Medicare Other

## 2022-08-03 ENCOUNTER — Inpatient Hospital Stay
Admission: EM | Admit: 2022-08-03 | Discharge: 2022-08-12 | DRG: 884 | Disposition: A | Discharge status: Skilled Nursing Facility | Payer: Medicare Other | Attending: Internal Medicine | Admitting: Internal Medicine

## 2022-08-03 DIAGNOSIS — R531 Weakness: Secondary | ICD-10-CM | POA: Diagnosis not present

## 2022-08-03 DIAGNOSIS — Z751 Person awaiting admission to adequate facility elsewhere: Secondary | ICD-10-CM

## 2022-08-03 DIAGNOSIS — E538 Deficiency of other specified B group vitamins: Secondary | ICD-10-CM | POA: Diagnosis present

## 2022-08-03 DIAGNOSIS — R4182 Altered mental status, unspecified: Principal | ICD-10-CM

## 2022-08-03 DIAGNOSIS — F339 Major depressive disorder, recurrent, unspecified: Secondary | ICD-10-CM | POA: Diagnosis present

## 2022-08-03 DIAGNOSIS — R627 Adult failure to thrive: Secondary | ICD-10-CM | POA: Diagnosis present

## 2022-08-03 DIAGNOSIS — E440 Moderate protein-calorie malnutrition: Secondary | ICD-10-CM | POA: Insufficient documentation

## 2022-08-03 DIAGNOSIS — Z79899 Other long term (current) drug therapy: Secondary | ICD-10-CM

## 2022-08-03 DIAGNOSIS — I480 Paroxysmal atrial fibrillation: Secondary | ICD-10-CM | POA: Diagnosis present

## 2022-08-03 DIAGNOSIS — F17290 Nicotine dependence, other tobacco product, uncomplicated: Secondary | ICD-10-CM | POA: Diagnosis present

## 2022-08-03 DIAGNOSIS — F03918 Unspecified dementia, unspecified severity, with other behavioral disturbance: Secondary | ICD-10-CM | POA: Diagnosis not present

## 2022-08-03 DIAGNOSIS — F03B0 Unspecified dementia, moderate, without behavioral disturbance, psychotic disturbance, mood disturbance, and anxiety: Secondary | ICD-10-CM | POA: Diagnosis present

## 2022-08-03 DIAGNOSIS — R404 Transient alteration of awareness: Secondary | ICD-10-CM | POA: Diagnosis not present

## 2022-08-03 DIAGNOSIS — F0393 Unspecified dementia, unspecified severity, with mood disturbance: Secondary | ICD-10-CM | POA: Diagnosis present

## 2022-08-03 DIAGNOSIS — I169 Hypertensive crisis, unspecified: Secondary | ICD-10-CM | POA: Diagnosis not present

## 2022-08-03 DIAGNOSIS — R296 Repeated falls: Secondary | ICD-10-CM | POA: Diagnosis present

## 2022-08-03 DIAGNOSIS — I10 Essential (primary) hypertension: Secondary | ICD-10-CM | POA: Diagnosis present

## 2022-08-03 DIAGNOSIS — Z7901 Long term (current) use of anticoagulants: Secondary | ICD-10-CM

## 2022-08-03 DIAGNOSIS — F03B18 Unspecified dementia, moderate, with other behavioral disturbance: Secondary | ICD-10-CM | POA: Diagnosis present

## 2022-08-03 DIAGNOSIS — Z66 Do not resuscitate: Secondary | ICD-10-CM | POA: Diagnosis not present

## 2022-08-03 DIAGNOSIS — Z87440 Personal history of urinary (tract) infections: Secondary | ICD-10-CM

## 2022-08-03 DIAGNOSIS — F32A Depression, unspecified: Secondary | ICD-10-CM | POA: Diagnosis present

## 2022-08-03 DIAGNOSIS — E876 Hypokalemia: Secondary | ICD-10-CM | POA: Diagnosis not present

## 2022-08-03 DIAGNOSIS — Z9181 History of falling: Secondary | ICD-10-CM

## 2022-08-03 DIAGNOSIS — I160 Hypertensive urgency: Secondary | ICD-10-CM | POA: Diagnosis present

## 2022-08-03 DIAGNOSIS — Z1152 Encounter for screening for COVID-19: Secondary | ICD-10-CM

## 2022-08-03 LAB — TROPONIN I (HIGH SENSITIVITY)
Troponin I (High Sensitivity): 13 ng/L (ref <18)
Troponin I (High Sensitivity): 14 ng/L (ref <18)

## 2022-08-03 LAB — CBC
HCT: 35.7 % — ABNORMAL LOW (ref 39.0–52.0)
Hemoglobin: 11.6 g/dL — ABNORMAL LOW (ref 13.0–17.0)
MCH: 27.6 pg (ref 26.0–34.0)
MCHC: 32.5 g/dL (ref 30.0–36.0)
MCV: 84.8 fL (ref 80.0–100.0)
Platelets: 188 10*3/uL (ref 150–400)
RBC: 4.21 MIL/uL — ABNORMAL LOW (ref 4.22–5.81)
RDW: 13.8 % (ref 11.5–15.5)
WBC: 6.5 10*3/uL (ref 4.0–10.5)
nRBC: 0 % (ref 0.0–0.2)

## 2022-08-03 LAB — URINALYSIS, ROUTINE W REFLEX MICROSCOPIC
Bilirubin Urine: NEGATIVE
Glucose, UA: NEGATIVE mg/dL
Hgb urine dipstick: NEGATIVE
Ketones, ur: NEGATIVE mg/dL
Leukocytes,Ua: NEGATIVE
Nitrite: NEGATIVE
Protein, ur: NEGATIVE mg/dL
Specific Gravity, Urine: 1.012 (ref 1.005–1.030)
pH: 6 (ref 5.0–8.0)

## 2022-08-03 LAB — BASIC METABOLIC PANEL
Anion gap: 9 (ref 5–15)
BUN: 18 mg/dL (ref 8–23)
CO2: 28 mmol/L (ref 22–32)
Calcium: 8.5 mg/dL — ABNORMAL LOW (ref 8.9–10.3)
Chloride: 102 mmol/L (ref 98–111)
Creatinine, Ser: 1.12 mg/dL (ref 0.61–1.24)
GFR, Estimated: >60 mL/min (ref >60)
Glucose, Bld: 110 mg/dL — ABNORMAL HIGH (ref 70–99)
Potassium: 3.2 mmol/L — ABNORMAL LOW (ref 3.5–5.1)
Sodium: 139 mmol/L (ref 135–145)

## 2022-08-03 LAB — RESP PANEL BY RT-PCR (RSV, FLU A&B, COVID)  RVPGX2
Influenza A by PCR: NEGATIVE
Influenza B by PCR: NEGATIVE
Resp Syncytial Virus by PCR: NEGATIVE
SARS Coronavirus 2 by RT PCR: NEGATIVE

## 2022-08-03 MED ORDER — LISINOPRIL 20 MG PO TABS
20.0000 mg | ORAL_TABLET | Freq: Every day | ORAL | Status: DC
  Administered 2022-08-04 – 2022-08-12 (×9): 20 mg via ORAL
  Filled 2022-08-03 (×3): qty 1
  Filled 2022-08-03: qty 2
  Filled 2022-08-03 (×6): qty 1

## 2022-08-03 MED ORDER — MELATONIN 5 MG PO TABS
2.5000 mg | ORAL_TABLET | Freq: Every day | ORAL | Status: DC
  Administered 2022-08-03 – 2022-08-11 (×9): 2.5 mg via ORAL
  Filled 2022-08-03 (×9): qty 1

## 2022-08-03 MED ORDER — ADULT MULTIVITAMIN W/MINERALS CH
1.0000 | ORAL_TABLET | Freq: Every day | ORAL | Status: DC
  Administered 2022-08-04 – 2022-08-12 (×9): 1 via ORAL
  Filled 2022-08-03 (×9): qty 1

## 2022-08-03 MED ORDER — HYDROCHLOROTHIAZIDE 12.5 MG PO TABS
12.5000 mg | ORAL_TABLET | Freq: Every day | ORAL | Status: DC
  Administered 2022-08-04 – 2022-08-12 (×9): 12.5 mg via ORAL
  Filled 2022-08-03 (×9): qty 1

## 2022-08-03 MED ORDER — DONEPEZIL HCL 5 MG PO TABS
10.0000 mg | ORAL_TABLET | Freq: Every day | ORAL | Status: DC
  Administered 2022-08-03 – 2022-08-11 (×9): 10 mg via ORAL
  Filled 2022-08-03 (×9): qty 2

## 2022-08-03 MED ORDER — APIXABAN 5 MG PO TABS
5.0000 mg | ORAL_TABLET | Freq: Two times a day (BID) | ORAL | Status: DC
  Administered 2022-08-03 – 2022-08-12 (×18): 5 mg via ORAL
  Filled 2022-08-03 (×18): qty 1

## 2022-08-03 MED ORDER — LISINOPRIL 10 MG PO TABS
20.0000 mg | ORAL_TABLET | Freq: Once | ORAL | Status: AC
  Administered 2022-08-03: 20 mg via ORAL
  Filled 2022-08-03: qty 2

## 2022-08-03 MED ORDER — HYDROCHLOROTHIAZIDE 12.5 MG PO TABS
12.5000 mg | ORAL_TABLET | Freq: Once | ORAL | Status: AC
  Administered 2022-08-03: 12.5 mg via ORAL
  Filled 2022-08-03: qty 1

## 2022-08-03 MED ORDER — LISINOPRIL-HYDROCHLOROTHIAZIDE 20-12.5 MG PO TABS: 1.0000 | ORAL_TABLET | Freq: Two times a day (BID) | ORAL | Status: DC

## 2022-08-03 MED ORDER — DONEPEZIL HCL 5 MG PO TABS: 10.0000 mg | ORAL_TABLET | Freq: Every day | ORAL | Status: DC

## 2022-08-03 MED ORDER — POTASSIUM CHLORIDE CRYS ER 20 MEQ PO TBCR
20.0000 meq | EXTENDED_RELEASE_TABLET | Freq: Every day | ORAL | Status: DC
  Administered 2022-08-04 – 2022-08-12 (×9): 20 meq via ORAL
  Filled 2022-08-03 (×9): qty 1

## 2022-08-03 MED ORDER — SERTRALINE HCL 50 MG PO TABS
100.0000 mg | ORAL_TABLET | Freq: Every day | ORAL | Status: DC
  Administered 2022-08-04 – 2022-08-12 (×9): 100 mg via ORAL
  Filled 2022-08-03 (×9): qty 2

## 2022-08-03 MED ORDER — APIXABAN 5 MG PO TABS: 5.0000 mg | ORAL_TABLET | Freq: Two times a day (BID) | ORAL | Status: DC

## 2022-08-03 NOTE — ED Triage Notes (Signed)
Pt arrived via [facility] from home where family reported pt having AMS and strong smell of urine. Pt A-Fib on EKG monitor with hx/o same.

## 2022-08-03 NOTE — ED Provider Notes (Signed)
[facility] Provider Note    Event Date/Time   First MD Initiated Contact with Patient 08/03/22 2156     (approximate)   History   No chief complaint on file.   HPI  Jim Blake is a 87 y.o. male   Past medical history of dementia, depression, generalized weakness, anxiety, hypertension, atrial fibrillation on Eliquis who presents emergency department with generalized weakness and concern for UTI given very foul-smelling urine from home.  His baseline is limited now progressively worsening cognitive decline in the setting of dementia and mobility has been slowly worsening as well requiring assistance to get around the house, this is all per her daughter who is at bedside to give collateral information given his disorientation.  He has seemed more confused than normal.  No reported respiratory infectious symptoms like cough, shortness of breath, chest pain, fever  The patient has had no nausea vomiting or diarrhea.  He has had a history of UTIs the present similarly.  No reports of falls, though daughter is unsure because the patient lives with the son who is not here.  He has a medical history of frequent falls.  Independent Historian contributed to assessment above: Daughter who is at bedside contributes most of the information obtained above Limited information obtained by patient due to disorientation  External Medical Documents Reviewed: Discharge summary from hospital course dated May 05, 2022 when he was admitted for generalized weakness and found to have influenza A viral infection and norovirus gastroenteritis, at that time PT had recommended skilled nursing facility but eventually family opted to take him home with home health      Physical Exam   Triage Vital Signs: ED Triage Vitals  Enc Vitals Group     BP 08/03/22 1943 (!) 202/87     Pulse Rate 08/03/22 1943 64     Resp 08/03/22 1943 18     Temp 08/03/22 1943 98.7 F (37.1 C)     Temp  Source 08/03/22 1943 Oral     SpO2 08/03/22 1943 95 %     Weight 08/03/22 1946 190 lb (86.2 kg)     Height 08/03/22 1946 6' (1.829 m)     Head Circumference --      Peak Flow --      Pain Score --      Pain Loc --      Pain Edu? --      Excl. in GC? --     Most recent vital signs: Vitals:   08/03/22 2158 08/03/22 2200  BP:  (!) 191/92  Pulse:  (!) 53  Resp:  18  Temp: 99 F (37.2 C)   SpO2:  96%    General: Awake, no distress.  CV:  Dry mucous membranes Resp:  Normal effort.  Abd:  No distention.  Nontender to deep palpation all quadrants Other:  Moving all extremities with full active range of motion, neck is supple full range of motion, lungs without focality or wheezing   ED Results / Procedures / Treatments   Labs (all labs ordered are listed, but only abnormal results are displayed) Labs Reviewed  BASIC METABOLIC PANEL - Abnormal; Notable for the following components:      Result Value   Potassium 3.2 (*)    Glucose, Bld 110 (*)    Calcium 8.5 (*)    All other components within normal limits  CBC - Abnormal; Notable for the following components:   RBC 4.21 (*)  Hemoglobin 11.6 (*)    HCT 35.7 (*)    All other components within normal limits  URINALYSIS, ROUTINE W REFLEX MICROSCOPIC - Abnormal; Notable for the following components:   Color, Urine YELLOW (*)    APPearance CLEAR (*)    All other components within normal limits  RESP PANEL BY RT-PCR (RSV, FLU A&B, COVID)  [id number]  TROPONIN I (HIGH SENSITIVITY)  TROPONIN I (HIGH SENSITIVITY)     I ordered and reviewed the above labs they are notable for potassium slightly low at 3.2, white blood cell count is normal and hemoglobin is 11.6 at baseline  EKG  ED ECG REPORT I, Pilar Jarvis, the attending physician, personally viewed and interpreted this ECG.   Date: 08/03/2022  EKG Time: 1951  Rate: 53  Rhythm: Atrial fibrillation  Axis: nl  Intervals:none  ST&T Change: No acute ischemic  changes    RADIOLOGY I independently reviewed and interpreted CT scan of the head see no obvious bleeding or midline shift   PROCEDURES:  Critical Care performed: No  Procedures   MEDICATIONS ORDERED IN ED: Medications  donepezil (ARICEPT) tablet 10 mg (has no administration in time range)  apixaban (ELIQUIS) tablet 5 mg (has no administration in time range)  lisinopril (ZESTRIL) tablet 20 mg (has no administration in time range)  hydrochlorothiazide (HYDRODIURIL) tablet 12.5 mg (has no administration in time range)    External physician / consultants:  I spoke with hospitalist for admission and regarding care plan for this patient.   IMPRESSION / MDM / ASSESSMENT AND PLAN / ED COURSE  I reviewed the triage vital signs and the nursing notes.                                Patient's presentation is most consistent with acute presentation with potential threat to life or bodily function.  Differential diagnosis includes, but is not limited to, urinary tract infection, respiratory infection, metabolic derangement, dehydration, AKI, metabolic encephalopathy, intracranial bleeding, stroke   The patient is on the cardiac monitor to evaluate for evidence of arrhythmia and/or significant heart rate changes.  MDM: This is a patient who has altered mental status increased confusion from baseline history of UTIs and foul-smelling urine.  Check UTI with urinalysis, check basic labs for metabolic derangements, no reports of trauma but he is on Eliquis with frequent falls will check a CT head to rule intracranial bleeding.  No focal neurologic deficits to suggest stroke.  -- Workup is largely unremarkable for this patient with acute altered mental status today generalized weakness.  Reported from daughter normally able to walk with assistance but no longer able to do so and way more confused than normal today.  No evidence of urinary tract infection, CT head without intracranial  bleeding, and labs largely unremarkable.  He is hypertensive 190s to 200s with systolic so gave him his home hypertensive medications.  Admit for altered mental status/hypertensive crisis.      FINAL CLINICAL IMPRESSION(S) / ED DIAGNOSES   Final diagnoses:  Altered mental status, unspecified altered mental status type  Hypertensive crisis     Rx / DC Orders   ED Discharge Orders     None        Note:  This document was prepared using Dragon voice recognition software and may include unintentional dictation errors.    Pilar Jarvis, MD 08/03/22 [phone]

## 2022-08-03 NOTE — ED Triage Notes (Addendum)
First RN Note: Pt to ED via [facility] from home with c/o AMS and possible UTI. Per EMS pt has hx of A-fib, strong urine smell noted by EMS  187/91 97% RA 22RR  CBG 162 98.9  63HR  18G to L forearm

## 2022-08-04 ENCOUNTER — Encounter: Payer: Self-pay | Admitting: Family Medicine

## 2022-08-04 ENCOUNTER — Other Ambulatory Visit: Payer: Self-pay

## 2022-08-04 DIAGNOSIS — E440 Moderate protein-calorie malnutrition: Secondary | ICD-10-CM | POA: Diagnosis present

## 2022-08-04 DIAGNOSIS — Z9181 History of falling: Secondary | ICD-10-CM | POA: Diagnosis not present

## 2022-08-04 DIAGNOSIS — R531 Weakness: Secondary | ICD-10-CM | POA: Diagnosis not present

## 2022-08-04 DIAGNOSIS — Z515 Encounter for palliative care: Secondary | ICD-10-CM | POA: Diagnosis not present

## 2022-08-04 DIAGNOSIS — I480 Paroxysmal atrial fibrillation: Secondary | ICD-10-CM | POA: Diagnosis present

## 2022-08-04 DIAGNOSIS — Z1152 Encounter for screening for COVID-19: Secondary | ICD-10-CM | POA: Diagnosis not present

## 2022-08-04 DIAGNOSIS — I10 Essential (primary) hypertension: Secondary | ICD-10-CM | POA: Diagnosis present

## 2022-08-04 DIAGNOSIS — R4182 Altered mental status, unspecified: Secondary | ICD-10-CM

## 2022-08-04 DIAGNOSIS — I169 Hypertensive crisis, unspecified: Secondary | ICD-10-CM | POA: Diagnosis present

## 2022-08-04 DIAGNOSIS — E876 Hypokalemia: Secondary | ICD-10-CM | POA: Diagnosis present

## 2022-08-04 DIAGNOSIS — R627 Adult failure to thrive: Secondary | ICD-10-CM | POA: Diagnosis present

## 2022-08-04 DIAGNOSIS — F03918 Unspecified dementia, unspecified severity, with other behavioral disturbance: Secondary | ICD-10-CM | POA: Diagnosis present

## 2022-08-04 DIAGNOSIS — I160 Hypertensive urgency: Secondary | ICD-10-CM | POA: Diagnosis present

## 2022-08-04 DIAGNOSIS — E538 Deficiency of other specified B group vitamins: Secondary | ICD-10-CM | POA: Diagnosis present

## 2022-08-04 DIAGNOSIS — Z7189 Other specified counseling: Secondary | ICD-10-CM | POA: Diagnosis not present

## 2022-08-04 DIAGNOSIS — Z87440 Personal history of urinary (tract) infections: Secondary | ICD-10-CM | POA: Diagnosis not present

## 2022-08-04 DIAGNOSIS — Z79899 Other long term (current) drug therapy: Secondary | ICD-10-CM | POA: Diagnosis not present

## 2022-08-04 DIAGNOSIS — Z751 Person awaiting admission to adequate facility elsewhere: Secondary | ICD-10-CM | POA: Diagnosis not present

## 2022-08-04 DIAGNOSIS — F0393 Unspecified dementia, unspecified severity, with mood disturbance: Secondary | ICD-10-CM | POA: Diagnosis present

## 2022-08-04 DIAGNOSIS — Z7901 Long term (current) use of anticoagulants: Secondary | ICD-10-CM | POA: Diagnosis not present

## 2022-08-04 DIAGNOSIS — F32A Depression, unspecified: Secondary | ICD-10-CM | POA: Diagnosis present

## 2022-08-04 DIAGNOSIS — Z66 Do not resuscitate: Secondary | ICD-10-CM | POA: Diagnosis not present

## 2022-08-04 DIAGNOSIS — F17290 Nicotine dependence, other tobacco product, uncomplicated: Secondary | ICD-10-CM | POA: Diagnosis present

## 2022-08-04 DIAGNOSIS — R296 Repeated falls: Secondary | ICD-10-CM | POA: Diagnosis present

## 2022-08-04 LAB — BASIC METABOLIC PANEL
Anion gap: 7 (ref 5–15)
BUN: 18 mg/dL (ref 8–23)
CO2: 29 mmol/L (ref 22–32)
Calcium: 8.8 mg/dL — ABNORMAL LOW (ref 8.9–10.3)
Chloride: 103 mmol/L (ref 98–111)
Creatinine, Ser: 0.98 mg/dL (ref 0.61–1.24)
GFR, Estimated: >60 mL/min (ref >60)
Glucose, Bld: 94 mg/dL (ref 70–99)
Potassium: 3.3 mmol/L — ABNORMAL LOW (ref 3.5–5.1)
Sodium: 139 mmol/L (ref 135–145)

## 2022-08-04 LAB — CBC
HCT: 37.1 % — ABNORMAL LOW (ref 39.0–52.0)
Hemoglobin: 12 g/dL — ABNORMAL LOW (ref 13.0–17.0)
MCH: 27.3 pg (ref 26.0–34.0)
MCHC: 32.3 g/dL (ref 30.0–36.0)
MCV: 84.3 fL (ref 80.0–100.0)
Platelets: 179 10*3/uL (ref 150–400)
RBC: 4.4 MIL/uL (ref 4.22–5.81)
RDW: 13.7 % (ref 11.5–15.5)
WBC: 7.1 10*3/uL (ref 4.0–10.5)
nRBC: 0 % (ref 0.0–0.2)

## 2022-08-04 LAB — MAGNESIUM: Magnesium: 2.2 mg/dL (ref 1.7–2.4)

## 2022-08-04 LAB — TSH: TSH: 1.511 u[IU]/mL (ref 0.350–4.500)

## 2022-08-04 MED ORDER — ACETAMINOPHEN 325 MG PO TABS: 650.0000 mg | ORAL_TABLET | Freq: Four times a day (QID) | ORAL | Status: DC | PRN

## 2022-08-04 MED ORDER — ONDANSETRON HCL 4 MG PO TABS: 4.0000 mg | ORAL_TABLET | Freq: Four times a day (QID) | ORAL | Status: DC | PRN

## 2022-08-04 MED ORDER — POTASSIUM CHLORIDE 20 MEQ PO PACK
40.0000 meq | PACK | Freq: Once | ORAL | Status: AC
  Administered 2022-08-04: 40 meq via ORAL
  Filled 2022-08-04: qty 2

## 2022-08-04 MED ORDER — ONDANSETRON HCL 4 MG/2ML IJ SOLN: 4.0000 mg | Freq: Four times a day (QID) | INTRAMUSCULAR | Status: DC | PRN

## 2022-08-04 MED ORDER — ACETAMINOPHEN 650 MG RE SUPP: 650.0000 mg | Freq: Four times a day (QID) | RECTAL | Status: DC | PRN

## 2022-08-04 MED ORDER — HYDRALAZINE HCL 20 MG/ML IJ SOLN
10.0000 mg | INTRAMUSCULAR | Status: DC | PRN
  Administered 2022-08-04 – 2022-08-06 (×4): 10 mg via INTRAVENOUS
  Filled 2022-08-04 (×4): qty 1

## 2022-08-04 MED ORDER — TRAZODONE HCL 50 MG PO TABS: 25.0000 mg | ORAL_TABLET | Freq: Every evening | ORAL | Status: DC | PRN

## 2022-08-04 MED ORDER — HYDRALAZINE HCL 25 MG PO TABS
25.0000 mg | ORAL_TABLET | Freq: Three times a day (TID) | ORAL | Status: DC
  Administered 2022-08-04 – 2022-08-12 (×24): 25 mg via ORAL
  Filled 2022-08-04 (×24): qty 1

## 2022-08-04 MED ORDER — POTASSIUM CHLORIDE IN NACL 20-0.9 MEQ/L-% IV SOLN
INTRAVENOUS | Status: DC
  Filled 2022-08-04 (×8): qty 1000

## 2022-08-04 MED ORDER — MAGNESIUM HYDROXIDE 400 MG/5ML PO SUSP: 30.0000 mL | Freq: Every day | ORAL | Status: DC | PRN

## 2022-08-04 NOTE — H&P (Addendum)
[facility]   PATIENT NAME: Jim Blake    MR#:  [id number]  DATE OF BIRTH:  May 31, 1935  DATE OF ADMISSION:  08/03/2022  PRIMARY CARE PHYSICIAN: Dorothey BasemanBronstein, David, MD   Patient is coming from: Home  REQUESTING/REFERRING PHYSICIAN: Pilar JarvisWong, Silas, MD  CHIEF COMPLAINT:   Generalized weakness  HISTORY OF PRESENT ILLNESS:  Jim Blake is a 87 y.o. male with medical history significant for atrial fibrillation, anxiety and depression, hypertension and vitamin B12 deficiency, who presented to the emergency room with a [place] of the worsening generalized weakness as well as altered mental status with confusion and decreased mobility at home per his family.  No reported fever or chills.  No reported nausea or vomiting or abdominal pain.  No chest pain or palpitations.  No dyspnea or cough or wheezing.  No dysuria, oliguria or hematuria or flank pain.  No bleeding diathesis.  The patient had an offensive urine order though that his family was thinking as possible UTI.  He has been having worsening cognitive decline in the setting of dementia  ED Course: Upon presentation to the emergency room, BP was 202/87 with otherwise normal vital signs.  Labs revealed mild hypokalemia 3.2 with otherwise unremarkable BMP.  High sensitive troponin I was 13 and later 14.  CBC showed mild anemia close to baseline.  UA was negative.  Respiratory panel came back negative.  Imaging: Noncontrast head CT scan showed no acute intracranial normalities.  It showed chronic changes and new findings of mild scattered fluid in the lower mastoid air cells and chronic wax impaction filling the right external auditory canal.  Portable chest x-ray showed mild cardiomegaly and aortic atherosclerosis with no acute disease EKG as reviewed by me : EKG showed atrial fibrillation with slow ventricular response of 53.  The patient will be admitted to an observation medical telemetry bed for further evaluation and management.   PAST  MEDICAL HISTORY:   Past Medical History:  Diagnosis Date   Atrial fibrillation (HCC)    persistent   on NOAC   Hypertension    Vitamin B 12 deficiency   -Dementia  PAST SURGICAL HISTORY:  No past surgical history on file. No previous surgeries were reported. SOCIAL HISTORY:   Social History   Tobacco Use   Smoking status: Some Days    Types: Cigars   Smokeless tobacco: Never  Substance Use Topics   Alcohol use: Never    FAMILY HISTORY:  No family history on file. Unobtainable due to the patient's altered mental status DRUG ALLERGIES:  No Known Allergies  REVIEW OF SYSTEMS:   ROS As per history of present illness. All pertinent systems were reviewed above. Constitutional, HEENT, cardiovascular, respiratory, GI, GU, musculoskeletal, neuro, psychiatric, endocrine, integumentary and hematologic systems were reviewed and are otherwise negative/unremarkable except for positive findings mentioned above in the HPI.   MEDICATIONS AT HOME:   Prior to Admission medications   Medication Sig Start Date End Date Taking? Authorizing Provider  apixaban (ELIQUIS) 5 MG TABS tablet Take 5 mg by mouth 2 (two) times daily.   Yes [provider]  donepezil (ARICEPT) 10 MG tablet Take 1 tablet by mouth at bedtime. 02/05/22  Yes [provider]  lisinopril-hydrochlorothiazide (ZESTORETIC) 20-12.5 MG tablet Take 1 tablet by mouth 2 (two) times daily.   Yes [provider]  melatonin 3 MG TABS tablet Take 3 mg by mouth at bedtime.   Yes [provider]  Multiple Vitamins-Minerals (MULTIVITAMIN WITH MINERALS)  tablet Take 1 tablet by mouth daily.   Yes [provider]  Potassium Chloride ER 20 MEQ TBCR Take 1 tablet by mouth daily. 02/05/22  Yes [provider]  sertraline (ZOLOFT) 100 MG tablet Take 100 mg by mouth daily.   Yes [provider]      VITAL SIGNS:  Blood pressure (!) 179/90, pulse (!) 52, temperature 98.3 F (36.8  C), temperature source Oral, resp. rate 17, height 6' (1.829 m), weight 86.2 kg, SpO2 97 %.  PHYSICAL EXAMINATION:  Physical Exam  GENERAL:  87 y.o.-year-old Caucasian male patient lying in the bed with no acute distress.  EYES: Pupils equal, round, reactive to light and accommodation. No scleral icterus. Extraocular muscles intact.  HEENT: Head atraumatic, normocephalic. Oropharynx and nasopharynx clear.  NECK:  Supple, no jugular venous distention. No thyroid enlargement, no tenderness.  LUNGS: Normal breath sounds bilaterally, no wheezing, rales,rhonchi or crepitation. No use of accessory muscles of respiration.  CARDIOVASCULAR: Regular rate and rhythm, S1, S2 normal. No murmurs, rubs, or gallops.  ABDOMEN: Soft, nondistended, nontender. Bowel sounds present. No organomegaly or mass.  EXTREMITIES: No pedal edema, cyanosis, or clubbing.  NEUROLOGIC: Cranial nerves II through XII are intact. Muscle strength 5/5 in all extremities. Sensation intact. Gait not checked.  PSYCHIATRIC: The patient is alert and oriented x 1 to his name.  Normal affect and good eye contact. SKIN: No obvious rash, lesion, or ulcer.   LABORATORY PANEL:   CBC Recent Labs  Lab 08/03/22 1958  WBC 6.5  HGB 11.6*  HCT 35.7*  PLT 188   ------------------------------------------------------------------------------------------------------------------  Chemistries  Recent Labs  Lab 08/03/22 1958 08/03/22 2230  NA 139  --   K 3.2*  --   CL 102  --   CO2 28  --   GLUCOSE 110*  --   BUN 18  --   CREATININE 1.12  --   CALCIUM 8.5*  --   MG  --  2.2   ------------------------------------------------------------------------------------------------------------------  Cardiac Enzymes No results for input(s): "TROPONINI" in the last 168 hours. ------------------------------------------------------------------------------------------------------------------  RADIOLOGY:  CT Head Wo Contrast  Result Date:  08/03/2022 CLINICAL DATA:  Altered mental status.  Minor head trauma. EXAM: CT HEAD WITHOUT CONTRAST TECHNIQUE: Contiguous axial images were obtained from the base of the skull through the vertex without intravenous contrast. RADIATION DOSE REDUCTION: This exam was performed according to the departmental dose-optimization program which includes automated exposure control, adjustment of the mA and/or kV according to patient size and/or use of iterative reconstruction technique. COMPARISON:  Head CT 10/10/2020 FINDINGS: Brain: Again noted are moderately advanced cerebral atrophy, small-vessel disease and atrophic ventriculomegaly, without midline shift or visible interval progression. Relatively mild cerebellar atrophy also unchanged. There are few bilateral tiny chronic gangliocapsular lacunar infarcts. No new asymmetry is seen concerning for an acute cortical based infarct, hemorrhage, mass or mass effect. Basal cisterns are clear. Vascular: There is calcification in the carotid siphons and distal vertebral arteries. No hyperdense central vessel is seen. Skull: Negative for fractures or focal lesions. Sinuses/Orbits: Remote right lens extraction. Otherwise negative orbits. Clear paranasal sinuses. Midline nasal septum. Other: Chronic wax impaction again noted filling the right external auditory canal. New finding of mild scattered fluid in the bilateral lower mastoid air cells. Both middle ears are clear. IMPRESSION: 1. No acute intracranial CT findings or depressed skull fractures. 2. Chronic changes. 3. New finding of mild scattered fluid in the bilateral lower mastoid air cells. 4. Chronic wax impaction filling the  right external auditory canal. Electronically Signed   By: Almira Bar M.D.   On: 08/03/2022 23:02   DG Chest Port 1 View  Result Date: 08/03/2022 CLINICAL DATA:  Altered mental status, weakness, and malodorous urine. EXAM: PORTABLE CHEST 1 VIEW COMPARISON:  Portable chest 04/28/2022 FINDINGS:  There is mild cardiomegaly. No vascular congestion is seen. There is mild aortic atherosclerosis with normal mediastinal configuration. The lungs are clear. There is no substantial pleural effusion. There is advanced thoracic spondylosis with bilateral moderate shoulder DJD. IMPRESSION: No evidence of acute chest disease. Mild cardiomegaly. Aortic atherosclerosis. Stable chest. Electronically Signed   By: Almira Bar M.D.   On: 08/03/2022 22:53      IMPRESSION AND PLAN:  Assessment and Plan: * Generalized weakness - This is associated with failure to thrive. - He will be admitted to an observation medical telemetry bed. - Physical therapy consult to be will be obtained to assess need for rehabilitation postdischarge. - Case management consult will be obtained.  Essential hypertension - We will continue his antihypertensives.  Altered mental status - This could be a deterioration of his dementia. - At this time there is no infectious etiology or other clear etiologies. - We will follow neurochecks every 4 hours for 24 hours.  Hypokalemia - This could be minimally contributing to his generalized weakness. - We will replace potassium and check magnesium level.  Paroxysmal atrial fibrillation - The patient has current slow ventricular response. - We will continue his Eliquis.  Depression - We will continue Zoloft.  Dementia with behavioral disturbance - We will continue his Aricept.   DVT prophylaxis: Lovenox.  Advanced Care Planning:  Code Status: full code for now per his daughter.  It can be further discussed with the rest of the family in AM. Family Communication:  The plan of care was discussed in details with the patient (and family). I answered all questions. The patient agreed to proceed with the above mentioned plan. Further management will depend upon hospital course. Disposition Plan: Back to previous home environment Consults called: none. All the records are  reviewed and case discussed with ED provider.  Status is: Observation  I certify that at the time of admission, it is my clinical judgment that the patient will require hospital care extending  less than 2 midnights.                            Dispo: The patient is from: Home              Anticipated d/c is to: Home              Patient currently is not medically stable to d/c.              Difficult to place patient: No  Hannah Beat M.D on 08/04/2022 at 2:15 AM  [facility]   From 7 PM-7 AM, contact night-coverage [email or web address]  CC: Primary care physician; Dorothey Baseman, MD

## 2022-08-04 NOTE — ED Notes (Addendum)
Pt cleaned of incontinence. Pt keeps removing male purewick. New purewick placed.

## 2022-08-04 NOTE — Assessment & Plan Note (Signed)
-   This could be minimally contributing to his generalized weakness. - We will replace potassium and check magnesium level.

## 2022-08-04 NOTE — Assessment & Plan Note (Signed)
-   The patient has current slow ventricular response. - We will continue his Eliquis.

## 2022-08-04 NOTE — Assessment & Plan Note (Addendum)
-   This is associated with failure to thrive. - He will be admitted to an observation medical telemetry bed. - Physical therapy consult to be will be obtained to assess need for rehabilitation postdischarge. - Case management consult will be obtained.

## 2022-08-04 NOTE — ED Notes (Signed)
PT at bedside.

## 2022-08-04 NOTE — Evaluation (Signed)
Physical Therapy Evaluation Patient Details Name: Jim Blake MRN: [id number] DOB: 11-03-1935 Today's Date: 08/04/2022  History of Present Illness  87 y.o. male with medical history significant for atrial fibrillation, anxiety and depression, hypertension and vitamin B12 deficiency, who presented to the emergency room with a [place] of the worsening generalized weakness as well as altered mental status with confusion and decreased mobility at home per his family.  No reported fever or chills.  No reported nausea or vomiting or abdominal pain.  No chest pain or palpitations.  No dyspnea or cough or wheezing.  No dysuria, oliguria or hematuria or flank pain.  No bleeding diathesis.  The patient had an offensive urine order though that his family was thinking as possible UTI.  He has been having worsening cognitive decline in the setting of dementia  Clinical Impression  Pt is a pleasant 87 year old male who was admitted for generalized weakness. Pt performs bed mobility/transfers with mod assist and unable to ambulate at this time. Pt is high risk for falls and would benefit with gait trial with AD. Pt demonstrates deficits with cognition/strength/mobility. Would benefit from skilled PT to address above deficits and promote optimal return to PLOF. Will continue to maintain on acute care caseload while admitted. Will defer to [facility] team for disposition.     Recommendations for follow up therapy are one component of a multi-disciplinary discharge planning process, led by the attending physician.  Recommendations may be updated based on patient status, additional functional criteria and insurance authorization.  Follow Up Recommendations Can patient physically be transported by private vehicle: No     Assistance Recommended at Discharge Frequent or constant Supervision/Assistance  Patient can return home with the following  Two people to help with walking and/or transfers;A lot of help with  bathing/dressing/bathroom;Help with stairs or ramp for entrance    Equipment Recommendations  (TBD)  Recommendations for Other Services       Functional Status Assessment Patient has had a recent decline in their functional status and/or demonstrates limited ability to make significant improvements in function in a reasonable and predictable amount of time     Precautions / Restrictions Precautions Precautions: Fall Restrictions Weight Bearing Restrictions: No      Mobility  Bed Mobility Overal bed mobility: Needs Assistance Bed Mobility: Supine to Sit, Sit to Supine     Supine to sit: Mod assist Sit to supine: Min assist   General bed mobility comments: needs cues for sequencing and trunkal elevation. Once seated, able to progress to sitting with close supervision    Transfers Overall transfer level: Needs assistance Equipment used: 1 person hand held assist Transfers: Sit to/from Stand Sit to Stand: Mod assist           General transfer comment: pt able to attempt several stands, however needed mod assist from therapist for fully lift off. Once standing, very unsteady and only able to maintain standing for max of 10-15 seconds. Decreased uncontrolled descent back to bed    Ambulation/Gait               General Gait Details: anticipate need for RW, unsafe to trial ambulation this date. Pt unable to coordinate side steps up side of bed.  Stairs            Wheelchair Mobility    Modified Rankin (Stroke Patients Only)       Balance Overall balance assessment: Needs assistance Sitting-balance support: Feet supported Sitting balance-Leahy Scale: Poor  Standing balance support: During functional activity, Bilateral upper extremity supported Standing balance-Leahy Scale: Poor                               Pertinent Vitals/Pain Pain Assessment Pain Assessment: PAINAD Breathing: normal Negative Vocalization: none Facial  Expression: smiling or inexpressive Body Language: relaxed Consolability: no need to console PAINAD Score: 0    Home Living Family/patient expects to be discharged to:: Private residence Living Arrangements: Spouse/significant other Available Help at Discharge: Family;Available 24 hours/day Type of Home: House Home Access: Ramped entrance       Home Layout: One level Home Equipment: Agricultural consultant (2 wheels)      Prior Function Prior Level of Function : Needs assist             Mobility Comments: furniture walking throughout home. Resting tremor for year. Pt is poor historian, all information obtained from previous evaluation ADLs Comments: Pt's daughter reports assistance secondary to cognition for self care tasks. Family assists with IADLs and son lives nearby to come over and assist as needed daily.     Hand Dominance   Dominant Hand: Right    Extremity/Trunk Assessment   Upper Extremity Assessment Upper Extremity Assessment: Generalized weakness;Difficult to assess due to impaired cognition    Lower Extremity Assessment Lower Extremity Assessment: Difficult to assess due to impaired cognition;Generalized weakness       Communication   Communication: HOH  Cognition Arousal/Alertness: Awake/alert Behavior During Therapy: Flat affect Overall Cognitive Status: History of cognitive impairments - at baseline                                 General Comments: delayed processing; pleasantly confused        General Comments      Exercises Other Exercises Other Exercises: several sit<>Stands with PT able to place clean chux pad due to incontience   Assessment/Plan    PT Assessment Patient needs continued PT services  PT Problem List Decreased strength;Decreased activity tolerance;Decreased balance;Decreased mobility;Decreased cognition       PT Treatment Interventions Gait training;DME instruction;Therapeutic exercise;Balance training     PT Goals (Current goals can be found in the Care Plan section)  Acute Rehab PT Goals Patient Stated Goal: unable to state PT Goal Formulation: With family Time For Goal Achievement: 08/18/22 Potential to Achieve Goals: Fair    Frequency Min 2X/week     Co-evaluation               AM-PAC PT "6 Clicks" Mobility  Outcome Measure Help needed turning from your back to your side while in a flat bed without using bedrails?: A Lot Help needed moving from lying on your back to sitting on the side of a flat bed without using bedrails?: A Lot Help needed moving to and from a bed to a chair (including a wheelchair)?: A Lot Help needed standing up from a chair using your arms (e.g., wheelchair or bedside chair)?: A Lot Help needed to walk in hospital room?: Total Help needed climbing 3-5 steps with a railing? : Total 6 Click Score: 10    End of Session Equipment Utilized During Treatment: Gait belt Activity Tolerance:  (limited by cognition) Patient left: in bed;with bed alarm set Nurse Communication: Mobility status PT Visit Diagnosis: Unsteadiness on feet (R26.81);Muscle weakness (generalized) (M62.81);Difficulty in walking, not elsewhere classified (R26.2)  Time: [id number] PT Time Calculation (min) (ACUTE ONLY): 13 min   Charges:   PT Evaluation $PT Eval Low Complexity: 1 Low          Elizabeth PalauStephanie Ryla Cauthon, PT, DPT, GCS [phone]   Shailynn Fong 08/04/2022, 2:56 PM

## 2022-08-04 NOTE — TOC Initial Note (Signed)
Transition of Care [facility]) - Initial/Assessment Note    Patient Details  Name: Jim Blake MRN: [id number] Date of Birth: 02/20/1936  [facility]) CM/SW Contact:    Darolyn Rua, LCSW Phone Number: 08/04/2022, 2:17 PM  Clinical Narrative:                  CSW spoke with patient's son Aurther Loft at bedside he reports that patient is from home with wife, son and patient's daughter assist with care at home. Patient's mental status has declined recently in the past few weeks and he reports patient has needed increased assistance with mobility.   Patient's son Aurther Loft reports agreeable to SNF at discharge, agreeable for [employer] referral to look at potential ALF's for after SnF.   CSW has sent out referrals pending bed offers at this time.   Expected Discharge Plan: Skilled Nursing Facility Barriers to Discharge: Continued Medical Work up   Patient Goals and CMS Choice   CMS Medicare.gov Compare Post Acute Care list provided to:: Patient Represenative (must comment) (son Aurther Loft)        Expected Discharge Plan and Services       Living arrangements for the past 2 months: Single Family Home                                      Prior Living Arrangements/Services Living arrangements for the past 2 months: Single Family Home Lives with:: Relatives                   Activities of Daily Living Home Assistive Devices/Equipment: Environmental consultant (specify type), Wheelchair ADL Screening (condition at time of admission) Patient's cognitive ability adequate to safely complete daily activities?: No Is the patient deaf or have difficulty hearing?: No Does the patient have difficulty seeing, even when wearing glasses/contacts?: No Does the patient have difficulty concentrating, remembering, or making decisions?: Yes Patient able to express need for assistance with ADLs?: Yes Does the patient have difficulty dressing or bathing?: Yes Independently performs ADLs?:  No Communication: Independent Dressing (OT): Needs assistance Is this a change from baseline?: Pre-admission baseline Grooming: Independent Feeding: Needs assistance (set up) Is this a change from baseline?: Pre-admission baseline Bathing: Needs assistance Is this a change from baseline?: Pre-admission baseline Toileting: Needs assistance Is this a change from baseline?: Pre-admission baseline In/Out Bed: Needs assistance Is this a change from baseline?: Pre-admission baseline Walks in Home: Dependent (wc) Is this a change from baseline?: Pre-admission baseline Does the patient have difficulty walking or climbing stairs?: Yes Weakness of Legs: Both Weakness of Arms/Hands: None  Permission Sought/Granted                  Emotional Assessment              Admission diagnosis:  Generalized weakness [R53.1] Altered mental status [R41.82] Patient Active Problem List   Diagnosis Date Noted   Altered mental status 08/04/2022   Hypokalemia 08/04/2022   Influenza A 04/29/2022   Paroxysmal atrial fibrillation 04/29/2022   Dementia with behavioral disturbance 04/29/2022   Depression 04/29/2022   Acute encephalopathy 04/29/2022   Generalized weakness 04/29/2022   Pyuria, sterile 10/10/2020   Essential hypertension 10/10/2020   Anxiety 10/10/2020   B12 deficiency 10/10/2020   Atrial fibrillation, chronic 10/10/2020   Orthostatic hypotension 10/10/2020   Multiple falls 10/10/2020   PCP:  Dorothey Baseman, MD Pharmacy:  [facility] [address] [place] - [address] [place] [address] Phone: [phone] Fax: [phone]     Social Determinants of Health (SDOH) Social History: SDOH Screenings   Food Insecurity: No Food Insecurity (08/04/2022)  Housing: Low Risk  (08/04/2022)  Transportation Needs: No Transportation Needs (08/04/2022)  Utilities: Not At Risk (08/04/2022)  Tobacco Use: High Risk (08/04/2022)   SDOH Interventions:      Readmission Risk Interventions     No data to display

## 2022-08-04 NOTE — ED Notes (Signed)
Social work at bedside to speak with family.

## 2022-08-04 NOTE — ED Notes (Signed)
Pt pulling off male purewick. Pt cleaned of incontinence.

## 2022-08-04 NOTE — Assessment & Plan Note (Signed)
-   This could be a deterioration of his dementia. - At this time there is no infectious etiology or other clear etiologies. - We will follow neurochecks every 4 hours for 24 hours.

## 2022-08-04 NOTE — ED Notes (Signed)
RN cleaned pt of incontinence, male external purewick placed at this time. Pt resting comfortably at this time. Pt denies any additional needs or concerns.

## 2022-08-04 NOTE — ED Notes (Signed)
Pt family at bedside requesting to speak with social worker. RN reached out to social work to discuss questions with family.

## 2022-08-04 NOTE — Assessment & Plan Note (Signed)
-   We will continue his Aricept.

## 2022-08-04 NOTE — Progress Notes (Signed)
Brief hospitalist update note.  This is a nonbillable note.  Please see same-day H&P for full billable details..  Briefly, this is an 87 year old male with history significant for paroxysmal atrial fibrillation, anxiety, depression, hypertension, vitamin B12 deficiency who presents to the ER with chief complaint of altered mentation and decreased level mobility.  No other obvious signs or symptoms.  Laboratory and imaging survey overall unrevealing.  Patient is markedly hypertensive.  Urinalysis not consistent with infection.  At this point I suspect a progression of his underlying cognitive decline.  No immediately evident metabolic or toxic insults that need correction.  I do recommend placement in a care facility postdischarge.  Lolita Patella MD  No charge

## 2022-08-04 NOTE — Assessment & Plan Note (Signed)
-   We will continue his antihypertensives. 

## 2022-08-04 NOTE — NC FL2 (Signed)
[place] MEDICAID FL2 LEVEL OF CARE FORM     IDENTIFICATION  Patient Name: Jim Blake Birthdate: 1935/12/20 Sex: male Admission Date (Current Location): 08/03/2022  [facility] and [place] Number:  Chiropodist and Address:  [facility], [address] [place], [place] [address]      Provider Number: [id number]  Attending Physician Name and Address:  Tresa Moore, MD  Relative Name and Phone Number:  Janae Sauce  [phone]    Current Level of Care: Hospital Recommended Level of Care: Skilled Nursing Facility Prior Approval Number:    Date Approved/Denied:   PASRR Number: [id number]  Discharge Plan: SNF    Current Diagnoses: Patient Active Problem List   Diagnosis Date Noted   Altered mental status 08/04/2022   Hypokalemia 08/04/2022   Influenza A 04/29/2022   Paroxysmal atrial fibrillation 04/29/2022   Dementia with behavioral disturbance 04/29/2022   Depression 04/29/2022   Acute encephalopathy 04/29/2022   Generalized weakness 04/29/2022   Pyuria, sterile 10/10/2020   Essential hypertension 10/10/2020   Anxiety 10/10/2020   B12 deficiency 10/10/2020   Atrial fibrillation, chronic 10/10/2020   Orthostatic hypotension 10/10/2020   Multiple falls 10/10/2020    Orientation RESPIRATION BLADDER Height & Weight     Self  Normal Continent Weight: 190 lb (86.2 kg) Height:  6' (182.9 cm)  BEHAVIORAL SYMPTOMS/MOOD NEUROLOGICAL BOWEL NUTRITION STATUS      Incontinent Diet (see discharge summary)  AMBULATORY STATUS COMMUNICATION OF NEEDS Skin   Extensive Assist Verbally Normal                       Personal Care Assistance Level of Assistance  Bathing, Feeding, Total care, Dressing Bathing Assistance: Limited assistance Feeding assistance: Independent Dressing Assistance: Limited assistance Total Care Assistance: Maximum assistance   Functional Limitations Info  Sight, Hearing, Speech Sight Info: Adequate Hearing  Info: Adequate Speech Info: Adequate    SPECIAL CARE FACTORS FREQUENCY  PT (By licensed PT), OT (By licensed OT)     PT Frequency: min 4x weekly OT Frequency: min 4x weekly            Contractures Contractures Info: Not present    Additional Factors Info  Code Status, Allergies Code Status Info: full Allergies Info: NKA           Current Medications (08/04/2022):  This is the current hospital active medication list Current Facility-Administered Medications  Medication Dose Route Frequency Provider Last Rate Last Admin   0.9 % NaCl with KCl 20 mEq/ L  infusion   Intravenous Continuous Mansy, Jan A, MD 100 mL/hr at 08/04/22 1225 New Bag at 08/04/22 1225   acetaminophen (TYLENOL) tablet 650 mg  650 mg Oral Q6H PRN Mansy, Jan A, MD       Or   acetaminophen (TYLENOL) suppository 650 mg  650 mg Rectal Q6H PRN Mansy, Jan A, MD       apixaban (ELIQUIS) tablet 5 mg  5 mg Oral BID Pilar Jarvis, MD   5 mg at 08/04/22 0917   donepezil (ARICEPT) tablet 10 mg  10 mg Oral QHS Pilar Jarvis, MD   10 mg at 08/03/22 2355   hydrALAZINE (APRESOLINE) injection 10 mg  10 mg Intravenous Q4H PRN Lolita Patella B, MD   10 mg at 08/04/22 1225   lisinopril (ZESTRIL) tablet 20 mg  20 mg Oral Daily Otelia Sergeant, RPH   20 mg at 08/04/22 3716   And   hydrochlorothiazide (  HYDRODIURIL) tablet 12.5 mg  12.5 mg Oral Daily Otelia Sergeant, RPH   12.5 mg at 08/04/22 3903   magnesium hydroxide (MILK OF MAGNESIA) suspension 30 mL  30 mL Oral Daily PRN Mansy, Jan A, MD       melatonin tablet 2.5 mg  2.5 mg Oral QHS Mansy, Jan A, MD   2.5 mg at 08/03/22 2356   multivitamin with minerals tablet 1 tablet  1 tablet Oral Daily Mansy, Jan A, MD   1 tablet at 08/04/22 0917   ondansetron (ZOFRAN) tablet 4 mg  4 mg Oral Q6H PRN Mansy, Jan A, MD       Or   ondansetron [facility]) injection 4 mg  4 mg Intravenous Q6H PRN Mansy, Jan A, MD       potassium chloride SA (KLOR-CON M) CR tablet 20 mEq  20 mEq Oral Daily Mansy, Jan  A, MD   20 mEq at 08/04/22 0092   sertraline (ZOLOFT) tablet 100 mg  100 mg Oral Daily Mansy, Jan A, MD   100 mg at 08/04/22 3300   traZODone (DESYREL) tablet 25 mg  25 mg Oral QHS PRN Mansy, Vernetta Honey, MD       Current Outpatient Medications  Medication Sig Dispense Refill   apixaban (ELIQUIS) 5 MG TABS tablet Take 5 mg by mouth 2 (two) times daily.     donepezil (ARICEPT) 10 MG tablet Take 1 tablet by mouth at bedtime.     lisinopril-hydrochlorothiazide (ZESTORETIC) 20-12.5 MG tablet Take 1 tablet by mouth 2 (two) times daily.     melatonin 3 MG TABS tablet Take 3 mg by mouth at bedtime.     Multiple Vitamins-Minerals (MULTIVITAMIN WITH MINERALS) tablet Take 1 tablet by mouth daily.     Potassium Chloride ER 20 MEQ TBCR Take 1 tablet by mouth daily.     sertraline (ZOLOFT) 100 MG tablet Take 100 mg by mouth daily.       Discharge Medications: Please see discharge summary for a list of discharge medications.  Relevant Imaging Results:  Relevant Lab Results:   Additional Information SSN: [id number]  Darolyn Rua, LCSW

## 2022-08-04 NOTE — ED Notes (Signed)
Assumed care from [facility]. Pt resting comfortably in bed at this time. Pt denies any current needs or questions. Call light with in reach. Pt A&Ox1.

## 2022-08-04 NOTE — ED Notes (Signed)
Pt cleaned of urinary and stool incontinence. Bed linens changed.

## 2022-08-04 NOTE — ED Notes (Signed)
OT at bedside. 

## 2022-08-04 NOTE — Evaluation (Signed)
Occupational Therapy Evaluation Patient Details Name: Jim Blake MRN: [id number] DOB: 04/03/1936 Today's Date: 08/04/2022   History of Present Illness 87 y.o. male with medical history significant for atrial fibrillation, anxiety and depression, hypertension and vitamin B12 deficiency, who presented to the emergency room with a [place] of the worsening generalized weakness as well as altered mental status with confusion and decreased mobility at home per his family.  No reported fever or chills.  No reported nausea or vomiting or abdominal pain.  No chest pain or palpitations.  No dyspnea or cough or wheezing.  No dysuria, oliguria or hematuria or flank pain.  No bleeding diathesis.  The patient had an offensive urine order though that his family was thinking as possible UTI.  He has been having worsening cognitive decline in the setting of dementia   Clinical Impression   Patient presenting with decreased Ind in self care,balance, functional mobility/transfers, endurance, and safety awareness. Pt's daughter present to confirm baseline. Pt lives at home with wife but son nearby and comes daily to assist as needed. Pt typically furniture walks and needs some assistance for self care secondary to cognition. Pt is pleasant during session but needing mod A for sitting balance on EOB to don B socks. Mod A to stand from bed and min - mod A to take several steps along EOB. Pt standing for therapist to remove soiled brief. Total A for hygiene and to don clean one once returned back to bed. Daughter reports her mother is unable to care for him at this level at home.  Patient will benefit from acute OT to increase overall independence in the areas of ADLs, functional mobility, and safety awareness in order to safely discharge.     Recommendations for follow up therapy are one component of a multi-disciplinary discharge planning process, led by the attending physician.  Recommendations may be updated based on  patient status, additional functional criteria and insurance authorization.   Assistance Recommended at Discharge Frequent or constant Supervision/Assistance  Patient can return home with the following A lot of help with bathing/dressing/bathroom;A lot of help with walking and/or transfers;Help with stairs or ramp for entrance;Assist for transportation;Assistance with cooking/housework    Functional Status Assessment  Patient has had a recent decline in their functional status and demonstrates the ability to make significant improvements in function in a reasonable and predictable amount of time.  Equipment Recommendations  Other (comment) (defer)       Precautions / Restrictions Precautions Precautions: Fall      Mobility Bed Mobility Overal bed mobility: Needs Assistance Bed Mobility: Supine to Sit, Sit to Supine     Supine to sit: Mod assist Sit to supine: Min assist        Transfers Overall transfer level: Needs assistance Equipment used: None Transfers: Sit to/from Stand Sit to Stand: Mod assist                  Balance Overall balance assessment: Needs assistance Sitting-balance support: Feet supported Sitting balance-Leahy Scale: Poor Sitting balance - Comments: min - mod A Postural control: Posterior lean Standing balance support: During functional activity, Bilateral upper extremity supported Standing balance-Leahy Scale: Poor                             ADL either performed or assessed with clinical judgement   ADL Overall ADL's : Needs assistance/impaired  Lower Body Dressing: Minimal assistance;Sitting/lateral leans Lower Body Dressing Details (indicate cue type and reason): min A for balance to lean forward and don B socks Toilet Transfer: Moderate assistance Toilet Transfer Details (indicate cue type and reason): simulated         Functional mobility during ADLs: Minimal assistance;Moderate  assistance       Vision Patient Visual Report: No change from baseline              Pertinent Vitals/Pain Pain Assessment Pain Assessment: No/denies pain     Hand Dominance Right   Extremity/Trunk Assessment Upper Extremity Assessment Upper Extremity Assessment: Generalized weakness   Lower Extremity Assessment Lower Extremity Assessment: Generalized weakness       Communication Communication Communication: HOH   Cognition Arousal/Alertness: Awake/alert Behavior During Therapy: Flat affect Overall Cognitive Status: History of cognitive impairments - at baseline                                 General Comments: daughter present to confirm cognition has been declining this year. Pt is pleasant and follows 1 step commands with increased time.                Home Living Family/patient expects to be discharged to:: Private residence Living Arrangements: Spouse/significant other Available Help at Discharge: Family;Available 24 hours/day Type of Home: House Home Access: Ramped entrance     Home Layout: One level     Bathroom Shower/Tub: Walk-in shower         Home Equipment: Agricultural consultant (2 wheels)          Prior Functioning/Environment Prior Level of Function : Needs assist             Mobility Comments: furniture walking throughout home. Resting tremor for year. ADLs Comments: Pt's daughter reports assistance secondary to cognition for self care tasks. Family assists with IADLs and son lives nearby to come over and assist as needed daily.        OT Problem List: Decreased strength;Decreased activity tolerance;Decreased safety awareness;Impaired balance (sitting and/or standing);Decreased knowledge of use of DME or AE;Decreased knowledge of precautions;Decreased cognition      OT Treatment/Interventions: Self-care/ADL training;Therapeutic exercise;Therapeutic activities;Energy conservation;DME and/or AE  instruction;Patient/family education;Balance training    OT Goals(Current goals can be found in the care plan section) Acute Rehab OT Goals Patient Stated Goal: to get stronger and go to rehab OT Goal Formulation: With patient/family Time For Goal Achievement: 08/18/22 Potential to Achieve Goals: Fair ADL Goals Pt Will Perform Grooming: with supervision;standing Pt Will Perform Lower Body Dressing: with supervision;sit to/from stand Pt Will Transfer to Toilet: with supervision;ambulating Pt Will Perform Toileting - Clothing Manipulation and hygiene: with supervision;sit to/from stand  OT Frequency: Min 1X/week       AM-PAC OT "6 Clicks" Daily Activity     Outcome Measure Help from another person eating meals?: A Little Help from another person taking care of personal grooming?: A Little Help from another person toileting, which includes using toliet, bedpan, or urinal?: A Lot Help from another person bathing (including washing, rinsing, drying)?: A Lot Help from another person to put on and taking off regular upper body clothing?: A Little Help from another person to put on and taking off regular lower body clothing?: A Lot 6 Click Score: 15   End of Session Nurse Communication: Mobility status  Activity Tolerance: Patient tolerated treatment well Patient left: in bed;with  call bell/phone within reach;with bed alarm set;with family/visitor present  OT Visit Diagnosis: Unsteadiness on feet (R26.81);Repeated falls (R29.6);Muscle weakness (generalized) (M62.81);History of falling (Z91.81)                Time: [id number] OT Time Calculation (min): 24 min Charges:  OT General Charges $OT Visit: 1 Visit OT Evaluation $OT Eval Moderate Complexity: 1 Mod OT Treatments $Self Care/Home Management : 8-22 mins  Jackquline DenmarkKatie Tenicia Gural, MS, OTR/L , CBIS ascom [phone]  08/04/22, 12:47 PM

## 2022-08-04 NOTE — Assessment & Plan Note (Addendum)
-   We will continue Zoloft 

## 2022-08-05 ENCOUNTER — Encounter: Payer: Self-pay | Admitting: Internal Medicine

## 2022-08-05 DIAGNOSIS — Z515 Encounter for palliative care: Secondary | ICD-10-CM | POA: Diagnosis not present

## 2022-08-05 DIAGNOSIS — F03918 Unspecified dementia, unspecified severity, with other behavioral disturbance: Secondary | ICD-10-CM | POA: Diagnosis not present

## 2022-08-05 DIAGNOSIS — R531 Weakness: Secondary | ICD-10-CM | POA: Diagnosis not present

## 2022-08-05 DIAGNOSIS — Z7189 Other specified counseling: Secondary | ICD-10-CM | POA: Diagnosis not present

## 2022-08-05 MED ORDER — ENSURE ENLIVE PO LIQD
237.0000 mL | Freq: Three times a day (TID) | ORAL | Status: DC
  Administered 2022-08-05 – 2022-08-12 (×21): 237 mL via ORAL

## 2022-08-05 NOTE — Progress Notes (Signed)
PROGRESS NOTE    Jim Blake  IFB:[id number] DOB: 1935/05/29 DOA: 08/03/2022 PCP: Dorothey Baseman, MD    Brief Narrative:  87 year old male with history significant for paroxysmal atrial fibrillation, anxiety, depression, hypertension, vitamin B12 deficiency who presents to the ER with chief complaint of altered mentation and decreased level mobility.  No other obvious signs or symptoms.  Laboratory and imaging survey overall unrevealing.  Patient is markedly hypertensive.  Urinalysis not consistent with infection.   At this point I suspect a progression of his underlying cognitive decline.  No immediately evident metabolic or toxic insults that need correction.  I do recommend placement in a care facility postdischarge.   Assessment & Plan:   Principal Problem:   Generalized weakness Active Problems:   Essential hypertension   Altered mental status   Hypokalemia   Paroxysmal atrial fibrillation   Dementia with behavioral disturbance   Depression  Generalized weakness Adult failure to thrive Altered mental status I suspect the patient's presentation is representative of progression of underlying cognitive decline.  No obvious toxic or metabolic etiologies to explain.  Physical and Occupational Therapy has been consulted.  Plan for discharge to skilled nursing facility. Plan: Continue out of bed, therapy as tolerated TOC consulted Patient is medically ready for discharge to a monitored care facility Palliative care consultation.  Case discussed with Aurelio Jew NP  Essential hypertension Blood pressure control improved after addition of p.o. hydralazine.  Will continue current regimen.  Avoid hypotension.  Hypokalemia Monitor and replace as necessary  Paroxysmal atrial fibrillation Currently rate controlled.  Continue PTA Eliquis.  May consider discontinuation of anticoagulation given progression of cognitive decline and increased fall risk.  Depression Dementia with  behavioral disturbances Continue PTA Zoloft and Aricept   DVT prophylaxis: Eliquis Code Status: Full Family Communication: Son at bedside 4/9 Disposition Plan: Status is: Inpatient Remains inpatient appropriate because: Unsafe discharge plan.  Will need skilled nursing facility.   Level of care: Med-Surg  Consultants:  Palliative care  Procedures:  None  Antimicrobials: None   Subjective: Seen and examined.  Son at bedside.  No visible distress.  Objective: Vitals:   08/05/22 0734 08/05/22 0758 08/05/22 0910 08/05/22 1016  BP: (!) 185/88 (!) 176/89 (!) 157/73 (!) 157/73  Pulse: 67     Resp: 19     Temp: 97.9 F (36.6 C)     TempSrc: Oral     SpO2: 99%     Weight:      Height:        Intake/Output Summary (Last 24 hours) at 08/05/2022 1403 Last data filed at 08/05/2022 0900 Gross per 24 hour  Intake 2255.39 ml  Output 800 ml  Net 1455.39 ml   Filed Weights   08/03/22 1946  Weight: 86.2 kg    Examination:  General exam: No visible distress Respiratory system: Clear to auscultation. Respiratory effort normal. Cardiovascular system: S1-S2 2, regular rate, irregular rhythm, no murmurs, no pedal edema Gastrointestinal system: Soft, NT/ND, normal bowel sounds Central nervous system: Alert.  Oriented x 1.  No focal deficits Extremities: Symmetric 5 x 5 power. Skin: No rashes, lesions or ulcers Psychiatry: Judgement and insight appear impaired. Mood & affect blunted.     Data Reviewed: I have personally reviewed following labs and imaging studies  CBC: Recent Labs  Lab 08/03/22 1958 08/04/22 0455  WBC 6.5 7.1  HGB 11.6* 12.0*  HCT 35.7* 37.1*  MCV 84.8 84.3  PLT 188 179   Basic Metabolic Panel: Recent Labs  Lab 08/03/22 1958 08/03/22 2230 08/04/22 0455  NA 139  --  139  K 3.2*  --  3.3*  CL 102  --  103  CO2 28  --  29  GLUCOSE 110*  --  94  BUN 18  --  18  CREATININE 1.12  --  0.98  CALCIUM 8.5*  --  8.8*  MG  --  2.2  --     GFR: Estimated Creatinine Clearance: 60.5 mL/min (by C-G formula based on SCr of 0.98 mg/dL). Liver Function Tests: No results for input(s): "AST", "ALT", "ALKPHOS", "BILITOT", "PROT", "ALBUMIN" in the last 168 hours. No results for input(s): "LIPASE", "AMYLASE" in the last 168 hours. No results for input(s): "AMMONIA" in the last 168 hours. Coagulation Profile: No results for input(s): "INR", "PROTIME" in the last 168 hours. Cardiac Enzymes: No results for input(s): "CKTOTAL", "CKMB", "CKMBINDEX", "TROPONINI" in the last 168 hours. BNP (last 3 results) No results for input(s): "PROBNP" in the last 8760 hours. HbA1C: No results for input(s): "HGBA1C" in the last 72 hours. CBG: No results for input(s): "GLUCAP" in the last 168 hours. Lipid Profile: No results for input(s): "CHOL", "HDL", "LDLCALC", "TRIG", "CHOLHDL", "LDLDIRECT" in the last 72 hours. Thyroid Function Tests: Recent Labs    08/04/22 0455  TSH 1.511   Anemia Panel: No results for input(s): "VITAMINB12", "FOLATE", "FERRITIN", "TIBC", "IRON", "RETICCTPCT" in the last 72 hours. Sepsis Labs: No results for input(s): "PROCALCITON", "LATICACIDVEN" in the last 168 hours.  Recent Results (from the past 240 hour(s))  Resp panel by RT-PCR (RSV, Flu A&B, Covid) Anterior Nasal Swab     Status: None   Collection Time: 08/03/22 11:05 PM   Specimen: Anterior Nasal Swab  Result Value Ref Range Status   SARS Coronavirus 2 by RT PCR NEGATIVE NEGATIVE Final    Comment: (NOTE) SARS-CoV-2 target nucleic acids are NOT DETECTED.  The SARS-CoV-2 RNA is generally detectable in upper respiratory specimens during the acute phase of infection. The lowest concentration of SARS-CoV-2 viral copies this assay can detect is 138 copies/mL. A negative result does not preclude SARS-Cov-2 infection and should not be used as the sole basis for treatment or other patient management decisions. A negative result may occur with  improper specimen  collection/handling, submission of specimen other than nasopharyngeal swab, presence of viral mutation(s) within the areas targeted by this assay, and inadequate number of viral copies(<138 copies/mL). A negative result must be combined with clinical observations, patient history, and epidemiological information. The expected result is Negative.  Fact Sheet for Patients:  BloggerCourse.com  Fact Sheet for Healthcare Providers:  SeriousBroker.it  This test is no t yet approved or cleared by the [place] FDA and  has been authorized for detection and/or diagnosis of SARS-CoV-2 by FDA under an Emergency Use Authorization (EUA). This EUA will remain  in effect (meaning this test can be used) for the duration of the COVID-19 declaration under Section 564(b)(1) of the Act, 21 U.S.C.section 360bbb-3(b)(1), unless the authorization is terminated  or revoked sooner.       Influenza A by PCR NEGATIVE NEGATIVE Final   Influenza B by PCR NEGATIVE NEGATIVE Final    Comment: (NOTE) The Xpert Xpress SARS-CoV-2/FLU/RSV plus assay is intended as an aid in the diagnosis of influenza from Nasopharyngeal swab specimens and should not be used as a sole basis for treatment. Nasal washings and aspirates are unacceptable for Xpert Xpress SARS-CoV-2/FLU/RSV testing.  Fact Sheet for Patients: BloggerCourse.com  Fact Sheet for Healthcare  Providers: SeriousBroker.ithttps://www.fda.gov/media/152162/download  This test is not yet approved or cleared by the [place] FDA and has been authorized for detection and/or diagnosis of SARS-CoV-2 by FDA under an Emergency Use Authorization (EUA). This EUA will remain in effect (meaning this test can be used) for the duration of the COVID-19 declaration under Section 564(b)(1) of the Act, 21 U.S.C. section 360bbb-3(b)(1), unless the authorization is terminated or revoked.     Resp Syncytial  Virus by PCR NEGATIVE NEGATIVE Final    Comment: (NOTE) Fact Sheet for Patients: BloggerCourse.comhttps://www.fda.gov/media/152166/download  Fact Sheet for Healthcare Providers: SeriousBroker.ithttps://www.fda.gov/media/152162/download  This test is not yet approved or cleared by the [place] FDA and has been authorized for detection and/or diagnosis of SARS-CoV-2 by FDA under an Emergency Use Authorization (EUA). This EUA will remain in effect (meaning this test can be used) for the duration of the COVID-19 declaration under Section 564(b)(1) of the Act, 21 U.S.C. section 360bbb-3(b)(1), unless the authorization is terminated or revoked.  Performed at [facility], [address] [place], [place] [address]          Radiology Studies: CT Head Wo Contrast  Result Date: 08/03/2022 CLINICAL DATA:  Altered mental status.  Minor head trauma. EXAM: CT HEAD WITHOUT CONTRAST TECHNIQUE: Contiguous axial images were obtained from the base of the skull through the vertex without intravenous contrast. RADIATION DOSE REDUCTION: This exam was performed according to the departmental dose-optimization program which includes automated exposure control, adjustment of the mA and/or kV according to patient size and/or use of iterative reconstruction technique. COMPARISON:  Head CT 10/10/2020 FINDINGS: Brain: Again noted are moderately advanced cerebral atrophy, small-vessel disease and atrophic ventriculomegaly, without midline shift or visible interval progression. Relatively mild cerebellar atrophy also unchanged. There are few bilateral tiny chronic gangliocapsular lacunar infarcts. No new asymmetry is seen concerning for an acute cortical based infarct, hemorrhage, mass or mass effect. Basal cisterns are clear. Vascular: There is calcification in the carotid siphons and distal vertebral arteries. No hyperdense central vessel is seen. Skull: Negative for fractures or focal lesions. Sinuses/Orbits: Remote right lens  extraction. Otherwise negative orbits. Clear paranasal sinuses. Midline nasal septum. Other: Chronic wax impaction again noted filling the right external auditory canal. New finding of mild scattered fluid in the bilateral lower mastoid air cells. Both middle ears are clear. IMPRESSION: 1. No acute intracranial CT findings or depressed skull fractures. 2. Chronic changes. 3. New finding of mild scattered fluid in the bilateral lower mastoid air cells. 4. Chronic wax impaction filling the right external auditory canal. Electronically Signed   By: Almira BarKeith  Chesser M.D.   On: 08/03/2022 23:02   DG Chest Port 1 View  Result Date: 08/03/2022 CLINICAL DATA:  Altered mental status, weakness, and malodorous urine. EXAM: PORTABLE CHEST 1 VIEW COMPARISON:  Portable chest 04/28/2022 FINDINGS: There is mild cardiomegaly. No vascular congestion is seen. There is mild aortic atherosclerosis with normal mediastinal configuration. The lungs are clear. There is no substantial pleural effusion. There is advanced thoracic spondylosis with bilateral moderate shoulder DJD. IMPRESSION: No evidence of acute chest disease. Mild cardiomegaly. Aortic atherosclerosis. Stable chest. Electronically Signed   By: Almira BarKeith  Chesser M.D.   On: 08/03/2022 22:53        Scheduled Meds:  apixaban  5 mg Oral BID   donepezil  10 mg Oral QHS   hydrALAZINE  25 mg Oral Q8H   lisinopril  20 mg Oral Daily   And   hydrochlorothiazide  12.5 mg Oral Daily  melatonin  2.5 mg Oral QHS   multivitamin with minerals  1 tablet Oral Daily   potassium chloride SA  20 mEq Oral Daily   sertraline  100 mg Oral Daily   Continuous Infusions:  0.9 % NaCl with KCl 20 mEq / L 100 mL/hr at 08/05/22 0424     LOS: 1 day    Tresa Moore, MD [facility]   If 7PM-7AM, please contact night-coverage  08/05/2022, 2:03 PM

## 2022-08-05 NOTE — Progress Notes (Addendum)
Initial Nutrition Assessment  DOCUMENTATION CODES:   Non-severe (moderate) malnutrition in context of social or environmental circumstances  INTERVENTION:   -Ensure Enlive po TID, each supplement provides 350 kcal and 20 grams of protein.  -MVI with minerals daily -Liberalize diet to regular for widest variety of meal selections -Feeding assistance with meals -Obtain new wt  NUTRITION DIAGNOSIS:   Moderate Malnutrition related to social / environmental circumstances as evidenced by mild fat depletion, moderate fat depletion, mild muscle depletion, moderate muscle depletion.  GOAL:   Patient will meet greater than or equal to 90% of their needs  MONITOR:   PO intake, Supplement acceptance  REASON FOR ASSESSMENT:   Consult Assessment of nutrition requirement/status  ASSESSMENT:   Pt with history significant for paroxysmal atrial fibrillation, anxiety, depression, hypertension, vitamin B12 deficiency who presents with chief complaint of altered mentation and decreased level mobility.  Pt admitted with generalized weakness and failure to thrive.   Reviewed I/O's: +1.2 L x 24 hours   UOP: 800 ml x 24 hours   Case discussed with RN, pt with no difficulty taking medications.   Spoke with pt and daughter at beside. Pt did not recognize his daughter; daughter reports recognition is variable at baseline. Pt consumed 100% of dinner last night and a few bites of grits for breakfast. Noted meal completions 25-100%. PTA pt daughter reports fair appetite (pt lives with his wife, who prepares meals). Pt consumes 3 meals per day (Breakfast: eggs, toast and orange juice; Lunch: peanut butter and banana sandwich; Dinner: meat, starch, and vegetable). Pt with multiple missing teeth, but daughter reports that this does not impede his ability to chew and swallow foods.   Daughter reports progressive wt loss over the past year. She is unsure how much weight he has lost or what his UBW is. She  attributes weight loss to advanced age. Noted wt has been stable, however, suspect these may be stated weights vs measured weight. RD ordered weight to better assess acute weight changes.   Discussed importance of good meal and supplement intake to promote healing. Pt amenable to supplements. Pt has taken in the past, but daughter unsure if he takes currently.   Palliative care consulted for goals of care.   Medications reviewed and include 0.9% NaCl with KCl 20 mEq/L infusion @ 100 ml/hr, melatonin, and potassium chloride.   Labs reviewed: K: 3.3.    NUTRITION - FOCUSED PHYSICAL EXAM:  Flowsheet Row Most Recent Value  Orbital Region Mild depletion  Upper Arm Region Mild depletion  Thoracic and Lumbar Region No depletion  Buccal Region Mild depletion  Temple Region Moderate depletion  Clavicle Bone Region Moderate depletion  Clavicle and Acromion Bone Region Moderate depletion  Scapular Bone Region Moderate depletion  Dorsal Hand Mild depletion  Patellar Region Mild depletion  Anterior Thigh Region Mild depletion  Posterior Calf Region Mild depletion  Edema (RD Assessment) None  Hair Reviewed  Eyes Reviewed  Mouth Reviewed  Skin Reviewed  Nails Reviewed       Diet Order:   Diet Order             Diet regular Room service appropriate? Yes; Fluid consistency: Thin  Diet effective now                   EDUCATION NEEDS:   Education needs have been addressed  Skin:  Skin Assessment: Reviewed RN Assessment  Last BM:  08/05/22 (type 3)  Height:   Ht Readings from  Last 1 Encounters:  08/03/22 6' (1.829 m)    Weight:   Wt Readings from Last 1 Encounters:  08/03/22 86.2 kg    Ideal Body Weight:  80.9 kg  BMI:  Body mass index is 25.77 kg/m.  Estimated Nutritional Needs:   Kcal:  2150-2350  Protein:  105-120 grams  Fluid:  > 2 L    Levada Schilling, RD, LDN, CDCES Registered Dietitian II Certified Diabetes Care and Education Specialist Please refer to  [facility] for RD and/or RD on-call/weekend/after hours pager

## 2022-08-05 NOTE — Plan of Care (Signed)

## 2022-08-05 NOTE — Plan of Care (Signed)
  Problem: Education: Goal: Knowledge of General Education information will improve Description: Including pain rating scale, medication(s)/side effects and non-pharmacologic comfort measures 08/05/2022 0633 by Brylin Stopper M, RN Outcome: Progressing 08/05/2022 0622 by Gennie Eisinger M, RN Outcome: Progressing   Problem: Health Behavior/Discharge Planning: Goal: Ability to manage health-related needs will improve 08/05/2022 0633 by Zendaya Groseclose M, RN Outcome: Progressing 08/05/2022 0622 by Zyiah Withington M, RN Outcome: Progressing   Problem: Clinical Measurements: Goal: Ability to maintain clinical measurements within normal limits will improve 08/05/2022 0633 by Hermila Millis M, RN Outcome: Progressing 08/05/2022 0622 by Elainah Rhyne M, RN Outcome: Progressing Goal: Will remain free from infection 08/05/2022 0633 by Juelz Claar M, RN Outcome: Progressing 08/05/2022 0622 by Jade Burright M, RN Outcome: Progressing Goal: Diagnostic test results will improve 08/05/2022 0633 by Allix Blomquist M, RN Outcome: Progressing 08/05/2022 0622 by Angelisa Winthrop M, RN Outcome: Progressing Goal: Respiratory complications will improve 08/05/2022 0633 by Jilian West M, RN Outcome: Progressing 08/05/2022 0622 by Kirin Brandenburger M, RN Outcome: Progressing Goal: Cardiovascular complication will be avoided 08/05/2022 0633 by Lajeana Strough M, RN Outcome: Progressing 08/05/2022 0622 by Leslieann Whisman M, RN Outcome: Progressing   Problem: Activity: Goal: Risk for activity intolerance will decrease 08/05/2022 0633 by Hanif Radin M, RN Outcome: Progressing 08/05/2022 0622 by Dayvion Sans M, RN Outcome: Progressing   Problem: Nutrition: Goal: Adequate nutrition will be maintained 08/05/2022 0633 by Terricka Onofrio M, RN Outcome: Progressing 08/05/2022 0622 by Nechuma Boven M, RN Outcome: Progressing   Problem: Coping: Goal: Level of anxiety will decrease 08/05/2022 0633 by Johniece Hornbaker M, RN Outcome: Progressing 08/05/2022 0622 by Tavone Caesar M,  RN Outcome: Progressing   Problem: Elimination: Goal: Will not experience complications related to bowel motility 08/05/2022 0633 by Chastin Riesgo M, RN Outcome: Progressing 08/05/2022 0622 by Lorren Rossetti M, RN Outcome: Progressing Goal: Will not experience complications related to urinary retention 08/05/2022 0633 by Emmilee Reamer M, RN Outcome: Progressing 08/05/2022 0622 by Sharaya Boruff M, RN Outcome: Progressing   Problem: Pain Managment: Goal: General experience of comfort will improve 08/05/2022 0633 by Aldrick Derrig M, RN Outcome: Progressing 08/05/2022 0622 by Loriene Taunton M, RN Outcome: Progressing   Problem: Safety: Goal: Ability to remain free from injury will improve 08/05/2022 0633 by Judithe Keetch M, RN Outcome: Progressing 08/05/2022 0622 by Edvardo Honse M, RN Outcome: Progressing   Problem: Skin Integrity: Goal: Risk for impaired skin integrity will decrease 08/05/2022 0633 by Teodora Baumgarten M, RN Outcome: Progressing 08/05/2022 0622 by Beila Purdie M, RN Outcome: Progressing   

## 2022-08-05 NOTE — Consult Note (Signed)
Consultation Note Date: 08/05/2022   Patient Name: Jim Blake  DOB: 07-Jun-1935  MRN: [id number]  Age / Sex: 87 y.o., male  PCP: Jim BasemanBronstein, David, MD Referring Physician: Tresa MooreSreenath, Sudheer B, MD  Reason for Consultation: Establishing goals of care  HPI/Patient Profile: 87 y.o. male  with past medical history of dementia, A-fib, anxiety/depression, HTN, vitamin B12 deficiency, admitted on 08/03/2022 with generalized weakness.   Clinical Assessment and Goals of Care: I have reviewed medical records including EPIC notes, labs and imaging, received report from RN, assessed the patient.  Jim Blake is lying quietly in bed.  He appears acutely/chronically ill and very frail.  He greets me, making and somewhat keeping eye contact.  He has known dementia, is able to tell me his name, but not where we are.  He is alert, able to make his needs known.  His son, Jim Blake, is present at bedside.    Jim Blake and I meet outside the room to discuss diagnosis prognosis, GOC, EOL wishes, disposition and options.  I introduced Palliative Medicine as specialized medical care for people living with serious illness. It focuses on providing relief from the symptoms and stress of a serious illness. The goal is to improve quality of life for both the patient and the family.  Jim Blake shares that Jim Blake's wife, Jim Blake, Jim Blake, and patient's daughter Jim Blake, have been caring for him at home.  Jim Blake shares that Jim Blake's health is okay, but she has gotten down with her back due to caring for Jim Blake.  He states that they are requesting rehab with likely placement.  We talk about the progressive nature of dementia and some expected continued declines.  We talk about healthcare power of attorney.  Jim Blake shares this would be their mother Jim Blake. We talk about CODE STATUS.  He shares that Jim Blake never discussed his wishes with family.  We talk about the concept of  "treat the treatable, but allowing natural passing".  I share that I will reach out to Jim Blake.  Call to spouse, Jim Blake at [phone].  No answer, unable to leave voicemail message.  Called the home number listed [phone]. I introduced Palliative Medicine as specialized medical care for people living with serious illness. It focuses on providing relief from the symptoms and stress of a serious illness. The goal is to improve quality of life for both the patient and the family.  We discussed a brief life review of the patient. 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  Diagnosed with memory loss about 3 years, but much worse over the lase few weeks. Very much decreased mobility and functional status. Incontinent of urine and stool.   States they are feeding him, also, over the last few weeks.      We then focused on their current illness.  Jim Blake tells a story of decline over the last few weeks in particular.  Multiple times during our conversation she shares that overall her husband have been doing well, and this is hit him suddenly.  She  shares that they have not had anyone talk with him about the chronic illness pathway related to memory loss.  She shares that the doctor has told them that he does not feel that Jim Blake will be able to have good recovery.  We talk about normal changes that occur with memory loss and ability to recover.  The natural disease trajectory and expectations at EOL were discussed.  Advanced directives, concepts specific to code status, artifical feeding and hydration, and rehospitalization were considered and discussed.  Jim Blake shares that she and her husband have never talked about what he would and would not want at end-of-life.  She shares that she would like to talk with her children about choices and care.  Palliative Care services outpatient were explained and offered.  At this point Jim Blake declines outpatient palliative services.  We talk about the benefits of having  a nurse practitioner visit once a month to continue talking about the "what if's and maybe's".  I encouraged her to ask for the services when she is ready.  Discussed the importance of continued conversation with family and the medical providers regarding overall plan of care and treatment options, ensuring decisions are within the context of the patient's values and GOCs.  Questions and concerns were addressed.  The family was encouraged to call with questions or concerns.  PMT will continue to support holistically.  Conference with attending, bedside nursing staff, transition of care team related to patient condition, needs, goals of care, disposition.   HCPOA NEXT OF KIN -wife, Jim Blake along with adult children Jim Blake, and son Jim Blake in [place], [place]    SUMMARY OF RECOMMENDATIONS   At this point continue full scope/full code Time for outcomes Seeking short-term rehab Need for memory care would not be surprising  Declines outpatient palliative services at this time   Code Status/Advance Care Planning: Full code  Symptom Management:  Per hospitalist, no additional needs at this time.  Palliative Prophylaxis:  Frequent Pain Assessment, Oral Care, and Turn Reposition  Additional Recommendations (Limitations, Scope, Preferences): Full Scope Treatment  Psycho-social/Spiritual:  Desire for further Chaplaincy support:no Additional Recommendations: Caregiving  Support/Resources  Prognosis:  Unable to determine, based on outcomes.  6 months or less would not be surprising based on chronic illness burden, decreasing functional status.  Discharge Planning: Anticipate short-term rehab with need for memory care      Primary Diagnoses: Present on Admission:  Paroxysmal atrial fibrillation  Dementia with behavioral disturbance  Depression  Essential hypertension   I have reviewed the medical record, interviewed the patient and family, and examined the patient. The  following aspects are pertinent.  Past Medical History:  Diagnosis Date   Atrial fibrillation    persistent   on NOAC   Hypertension    Vitamin Blake 12 deficiency    Social History   Socioeconomic History   Marital status: Married    Spouse name: Not on file   Number of children: Not on file   Years of education: Not on file   Highest education level: Not on file  Occupational History   Not on file  Tobacco Use   Smoking status: Some Days    Types: Cigars   Smokeless tobacco: Never  Substance and Sexual Activity   Alcohol use: Never   Drug use: Never   Sexual activity: Not Currently  Other Topics Concern   Not on file  Social History Narrative   Not on file   Social Determinants  of Health   Financial Resource Strain: Not on file  Food Insecurity: No Food Insecurity (08/04/2022)   Hunger Vital Sign    Worried About Running Out of Food in the Last Year: Never true    Ran Out of Food in the Last Year: Never true  Transportation Needs: No Transportation Needs (08/04/2022)   PRAPARE - Administrator, Civil Service (Medical): No    Lack of Transportation (Non-Medical): No  Physical Activity: Not on file  Stress: Not on file  Social Connections: Not on file   History reviewed. No pertinent family history. Scheduled Meds:  apixaban  5 mg Oral BID   donepezil  10 mg Oral QHS   hydrALAZINE  25 mg Oral Q8H   lisinopril  20 mg Oral Daily   And   hydrochlorothiazide  12.5 mg Oral Daily   melatonin  2.5 mg Oral QHS   multivitamin with minerals  1 tablet Oral Daily   potassium chloride SA  20 mEq Oral Daily   sertraline  100 mg Oral Daily   Continuous Infusions:  0.9 % NaCl with KCl 20 mEq / L 100 mL/hr at 08/05/22 0424   PRN Meds:.acetaminophen **OR** acetaminophen, hydrALAZINE, magnesium hydroxide, ondansetron **OR** ondansetron (ZOFRAN) IV, traZODone Medications Prior to Admission:  Prior to Admission medications   Medication Sig Start Date End Date Taking?  Authorizing Provider  apixaban (ELIQUIS) 5 MG TABS tablet Take 5 mg by mouth 2 (two) times daily.   Yes [provider]  donepezil (ARICEPT) 10 MG tablet Take 1 tablet by mouth at bedtime. 02/05/22  Yes [provider]  lisinopril-hydrochlorothiazide (ZESTORETIC) 20-12.5 MG tablet Take 1 tablet by mouth 2 (two) times daily.   Yes [provider]  melatonin 3 MG TABS tablet Take 3 mg by mouth at bedtime.   Yes [provider]  Multiple Vitamins-Minerals (MULTIVITAMIN WITH MINERALS) tablet Take 1 tablet by mouth daily.   Yes [provider]  Potassium Chloride ER 20 MEQ TBCR Take 1 tablet by mouth daily. 02/05/22  Yes [provider]  sertraline (ZOLOFT) 100 MG tablet Take 100 mg by mouth daily.   Yes [provider]   No Known Allergies Review of Systems  Unable to perform ROS: Dementia    Physical Exam Vitals and nursing note reviewed.  Constitutional:      General: He is not in acute distress.    Appearance: He is ill-appearing.  HENT:     Mouth/Throat:     Mouth: Mucous membranes are moist.  Cardiovascular:     Rate and Rhythm: Normal rate.  Pulmonary:     Effort: Pulmonary effort is normal. No respiratory distress.  Neurological:     Mental Status: He is alert.     Comments: Oriented to self only, known dementia  Psychiatric:        Mood and Affect: Mood normal.        Behavior: Behavior normal.     Comments: Calm and cooperative, not fearful with son at bedside     Vital Signs: BP (!) 157/73   Pulse 67   Temp 97.9 F (36.6 C) (Oral)   Resp 19   Ht 6' (1.829 m)   Wt 86.2 kg   SpO2 99%   BMI 25.77 kg/m  Pain Scale: 0-10   Pain Score: 0-No pain   SpO2: SpO2: 99 % O2 Device:SpO2: 99 % O2 Flow Rate: .   IO: Intake/output summary:  Intake/Output Summary (Last 24  hours) at 08/05/2022 1425 Last data filed at 08/05/2022 0900 Gross per 24 hour  Intake 2255.39 ml  Output 800 ml  Net 1455.39 ml    LBM:  Last BM Date : 08/05/22 Baseline Weight: Weight: 86.2 kg Most recent weight: Weight: 86.2 kg     Palliative Assessment/Data:     Time In: 1300 Time Out: 1415 Time Total: 75 minutes Greater than 50%  of this time was spent counseling and coordinating care related to the above assessment and plan.  Signed by: Katheran Awe, NP   Please contact Palliative Medicine Team phone at [phone] for questions and concerns.  For individual provider: See Loretha Stapler

## 2022-08-05 NOTE — Progress Notes (Signed)
Physical Therapy Treatment Patient Details Name: Jim Blake MRN: [id number] DOB: June 15, 1935 Today's Date: 08/05/2022   History of Present Illness 87 y.o. male with medical history significant for atrial fibrillation, anxiety and depression, hypertension and vitamin B12 deficiency, who presented to the emergency room with a [place] of the worsening generalized weakness as well as altered mental status with confusion and decreased mobility at home per his family.  No reported fever or chills.  No reported nausea or vomiting or abdominal pain.  No chest pain or palpitations.  No dyspnea or cough or wheezing.  No dysuria, oliguria or hematuria or flank pain.  No bleeding diathesis.  The patient had an offensive urine order though that his family was thinking as possible UTI.  He has been having worsening cognitive decline in the setting of dementia    PT Comments    Pt ready for session.  Son in room and does state some increased confusion today.  Keeps looking in mirror as I talk to him and difficult to re-direct his attention to the other side of the bed where I am.  Son stated this has been happening a lot today.  Max cues and mod a to get to EOB.  Once sitting, he keeps trying to turn to look in the mirror.  He is able to maintain balance briefly but then turn and leans needing increased assist. Stood x 2 with mod a +2 for safety.  He is able to take a couple sidesteps with heavy cues but unsafe to walk away from bed as he sits without warning.  Returns to supine with mod a x 1 and heavy cues to initiate movement.  A sheet is taped over mirror as it remains a distraction.  He is noted to be wet and rolling left/right with mod a x 1 and hand placements for care.  Continues with difficulty following cues but he does seem less distracted by the mirror.   Recommendations for follow up therapy are one component of a multi-disciplinary discharge planning process, led by the attending physician.   Recommendations may be updated based on patient status, additional functional criteria and insurance authorization.  Follow Up Recommendations  Can patient physically be transported by private vehicle: No    Assistance Recommended at Discharge Frequent or constant Supervision/Assistance  Patient can return home with the following Two people to help with walking and/or transfers;A lot of help with bathing/dressing/bathroom;Help with stairs or ramp for entrance   Equipment Recommendations   (TBD)    Recommendations for Other Services       Precautions / Restrictions Precautions Precautions: Fall Restrictions Weight Bearing Restrictions: No     Mobility  Bed Mobility Overal bed mobility: Needs Assistance Bed Mobility: Supine to Sit, Sit to Supine     Supine to sit: Mod assist Sit to supine: Mod assist        Transfers Overall transfer level: Needs assistance Equipment used: Rolling walker (2 wheels) Transfers: Sit to/from Stand Sit to Stand: Mod assist, +2 physical assistance                Ambulation/Gait Ambulation/Gait assistance: Mod assist, +2 physical assistance Gait Distance (Feet): 2 Feet Assistive device: Rolling walker (2 wheels) Gait Pattern/deviations: Step-to pattern Gait velocity: decreased     General Gait Details: sidestpes along bed.  unsafe to progress gait away from bed   Stairs             Wheelchair Mobility    Modified Rankin (  Stroke Patients Only)       Balance Overall balance assessment: Needs assistance Sitting-balance support: Feet supported Sitting balance-Leahy Scale: Poor Sitting balance - Comments: constant verbal and tactile cues to remain upright   Standing balance support: During functional activity, Bilateral upper extremity supported Standing balance-Leahy Scale: Poor Standing balance comment: +2 for safety                            Cognition Arousal/Alertness: Awake/alert Behavior During  Therapy: Flat affect Overall Cognitive Status: Impaired/Different from baseline                                          Exercises Other Exercises Other Exercises: seated AROM in sitting.    General Comments        Pertinent Vitals/Pain Pain Assessment Pain Assessment: No/denies pain    Home Living                          Prior Function            PT Goals (current goals can now be found in the care plan section) Progress towards PT goals: Progressing toward goals    Frequency    Min 2X/week      PT Plan      Co-evaluation              AM-PAC PT "6 Clicks" Mobility   Outcome Measure  Help needed turning from your back to your side while in a flat bed without using bedrails?: A Lot Help needed moving from lying on your back to sitting on the side of a flat bed without using bedrails?: A Lot Help needed moving to and from a bed to a chair (including a wheelchair)?: A Lot Help needed standing up from a chair using your arms (e.g., wheelchair or bedside chair)?: A Lot Help needed to walk in hospital room?: Total Help needed climbing 3-5 steps with a railing? : Total 6 Click Score: 10    End of Session Equipment Utilized During Treatment: Gait belt Activity Tolerance:  (limited by cognition) Patient left: in bed;with bed alarm set Nurse Communication: Mobility status PT Visit Diagnosis: Unsteadiness on feet (R26.81);Muscle weakness (generalized) (M62.81);Difficulty in walking, not elsewhere classified (R26.2)     Time: 1350-1404 PT Time Calculation (min) (ACUTE ONLY): 14 min  Charges:  $Therapeutic Activity: 8-22 mins                   Danielle Dess, PTA 08/05/22, 2:41 PM

## 2022-08-06 ENCOUNTER — Encounter: Payer: Self-pay | Admitting: Internal Medicine

## 2022-08-06 DIAGNOSIS — Z7189 Other specified counseling: Secondary | ICD-10-CM | POA: Diagnosis not present

## 2022-08-06 DIAGNOSIS — Z515 Encounter for palliative care: Secondary | ICD-10-CM | POA: Diagnosis not present

## 2022-08-06 DIAGNOSIS — E440 Moderate protein-calorie malnutrition: Secondary | ICD-10-CM | POA: Insufficient documentation

## 2022-08-06 DIAGNOSIS — E876 Hypokalemia: Secondary | ICD-10-CM | POA: Diagnosis not present

## 2022-08-06 DIAGNOSIS — R531 Weakness: Secondary | ICD-10-CM | POA: Diagnosis not present

## 2022-08-06 LAB — POTASSIUM: Potassium: 3.6 mmol/L (ref 3.5–5.1)

## 2022-08-06 NOTE — Progress Notes (Signed)
Palliative: Jim Blake is resting quietly in bed.  He is sleeping, but wakes easily when I call his name and touch his arm.  He he has known dementia and does not attempt to answer my orientation questions.  I am not sure if he can make his needs known.  There is no family at bedside at this time.  Overall, family's goal is to continue to treat, short-term rehab with the goal of return to prior level of function.  They are considering memory care placement if needed.   Plan: At this point continue full scope/full code.  Short-term rehab with a goal of returning home if possible.  Declined outpatient palliative services. PMT to continue to follow.  25 minutes Jim Carmel, NP Palliative medicine team Team phone [phone] Greater than 50% of this time was spent counseling and coordinating care related to the above assessment and plan.

## 2022-08-06 NOTE — Progress Notes (Signed)
Physical Therapy Treatment Patient Details Name: Jim Blake MRN: [id number] DOB: [birth year] Today's Date: 08/06/2022   History of Present Illness 87 y.o. male with medical history significant for atrial fibrillation, anxiety and depression, hypertension and vitamin B12 deficiency, who presented to the emergency room with a Kalisetti of the worsening generalized weakness as well as altered mental status with confusion and decreased mobility at home per his family.  No reported fever or chills.  No reported nausea or vomiting or abdominal pain.  No chest pain or palpitations.  No dyspnea or cough or wheezing.  No dysuria, oliguria or hematuria or flank pain.  No bleeding diathesis.  The patient had an offensive urine order though that his family was thinking as possible UTI.  He has been having worsening cognitive decline in the setting of dementia    PT Comments    Pt was long sitting in bed upon arrival. He is A but confused and disoriented. Was able to follow commands with increased time and tcs. He required assistance to safely exit bed, stand, and ambulate with RW. Pt has poor safety awareness and poor insight of limitations. Recommend +2 assistance for RN staff with mobility due to his poor current cognitive state. Acute PT will continue to follow and progress as able per current POC.   Recommendations for follow up therapy are one component of a multi-disciplinary discharge planning process, led by the attending physician.  Recommendations may be updated based on patient status, additional functional criteria and insurance authorization.     Assistance Recommended at Discharge Frequent or constant Supervision/Assistance  Patient can return home with the following A lot of help with walking and/or transfers;A lot of help with bathing/dressing/bathroom;Assistance with cooking/housework;Assistance with feeding;Direct supervision/assist for medications management;Direct supervision/assist for  financial management;Assist for transportation;Help with stairs or ramp for entrance   Equipment Recommendations  Other (comment) (Defer to next level of care)       Precautions / Restrictions Precautions Precautions: Fall Restrictions Weight Bearing Restrictions: No     Mobility  Bed Mobility Overal bed mobility: Needs Assistance Bed Mobility: Supine to Sit, Sit to Supine  Supine to sit: Min assist Sit to supine: Min assist     Transfers Overall transfer level: Needs assistance Equipment used: Rolling walker (2 wheels) Transfers: Sit to/from Stand Sit to Stand: Mod assist   General transfer comment: from lowest bed height. mod assist of one    Ambulation/Gait Ambulation/Gait assistance: Min guard, Min assist Gait Distance (Feet): 25 Feet Assistive device: Rolling walker (2 wheels) Gait Pattern/deviations: Trunk flexed, Step-to pattern Gait velocity: decreased  General Gait Details: Pt was able to progress to ambulation this date but tends to let RW roll to far in front of him and required constant vcs to erect posture. min assist with navigating RW through tight spaces.        Balance Overall balance assessment: Needs assistance Sitting-balance support: Feet supported Sitting balance-Leahy Scale: Fair     Standing balance support: During functional activity, Bilateral upper extremity supported Standing balance-Leahy Scale: Poor Standing balance comment: high fall risk due to poor insight of situation/poor safety awareness/ Poor cognition       Cognition Arousal/Alertness: Awake/alert Behavior During Therapy: Flat affect Overall Cognitive Status: Impaired/Different from baseline    General Comments: Pt is alert but confused and disoriented. no family present to determine baseline. pleasantly confused.               Pertinent Vitals/Pain Pain Assessment Pain Assessment: No/denies pain  Breathing: normal     PT Goals (current goals can now be found  in the care plan section) Acute Rehab PT Goals Patient Stated Goal: pt was very confused and disoriented however does follow simple commands consistently throughout Progress towards PT goals: Progressing toward goals    Frequency    Min 2X/week      PT Plan Current plan remains appropriate       AM-PAC PT "6 Clicks" Mobility   Outcome Measure  Help needed turning from your back to your side while in a flat bed without using bedrails?: A Lot Help needed moving from lying on your back to sitting on the side of a flat bed without using bedrails?: A Lot Help needed moving to and from a bed to a chair (including a wheelchair)?: A Lot Help needed standing up from a chair using your arms (e.g., wheelchair or bedside chair)?: A Lot Help needed to walk in hospital room?: Total Help needed climbing 3-5 steps with a railing? : Total 6 Click Score: 10    End of Session Equipment Utilized During Treatment: Gait belt Activity Tolerance: Patient tolerated treatment well Patient left: in bed;with bed alarm set Nurse Communication: Mobility status PT Visit Diagnosis: Unsteadiness on feet (R26.81);Muscle weakness (generalized) (M62.81);Difficulty in walking, not elsewhere classified (R26.2)     Time: 4034-7425 PT Time Calculation (min) (ACUTE ONLY): 12 min  Charges:  $Gait Training: 8-22 mins                    Jetta Lout PTA 08/06/22, 1:36 PM

## 2022-08-06 NOTE — TOC Progression Note (Signed)
Transition of Care [facility]) - Progression Note    Patient Details  Name: Jim Blake MRN: [id number] Date of Birth: Nov 08, 1935  [facility]) CM/SW Contact  Garret Reddish, RN Phone Number: 08/06/2022, 1:51 PM  Clinical Narrative:   Chart reviewed.  TOC continue to work on [facility] placement.  No bed offers at this time.  I have expanded search to entire hub so see if we can obtain a bed offer.  TOC will continue to follow for discharge planning.      Expected Discharge Plan: Skilled Nursing Facility Barriers to Discharge: Continued Medical Work up  Expected Discharge Plan and Services       Living arrangements for the past 2 months: Single Family Home                                       Social Determinants of Health (SDOH) Interventions SDOH Screenings   Food Insecurity: No Food Insecurity (08/04/2022)  Housing: Low Risk  (08/04/2022)  Transportation Needs: No Transportation Needs (08/04/2022)  Utilities: Not At Risk (08/04/2022)  Tobacco Use: High Risk (08/06/2022)    Readmission Risk Interventions     No data to display

## 2022-08-06 NOTE — Plan of Care (Signed)

## 2022-08-06 NOTE — Progress Notes (Signed)
Progress Note    Davyd Garrido  BEM:[id number] DOB: [birth year]  DOA: 08/03/2022 PCP: Dorothey Baseman, MD      Brief Narrative:    Medical records reviewed and are as summarized below:  Lenis Mohl is a 87 y.o. male with history significant for paroxysmal atrial fibrillation, anxiety, depression, hypertension, vitamin B12 deficiency who presents to the ER with chief complaint of altered mentation and decreased level mobility.      Assessment/Plan:   Principal Problem:   Generalized weakness Active Problems:   Essential hypertension   Altered mental status   Hypokalemia   Paroxysmal atrial fibrillation   Dementia with behavioral disturbance   Depression   Malnutrition of moderate degree   Nutrition Problem: Moderate Malnutrition Etiology: social / environmental circumstances  Signs/Symptoms: mild fat depletion, moderate fat depletion, mild muscle depletion, moderate muscle depletion   Body mass index is 24.85 kg/m.   General weakness, adult failure to thrive, change in mental status: Mental status suspected to be due to progression of dementia.  Plan to discharge to SNF.  Discontinue IV fluids.   Hypokalemia: Improved   Paroxysmal atrial fibrillation: Continue Eliquis   Hypertension: Continue antihypertensives   Dementia with behavioral disturbance: Continue donepezil and supportive care   Depression: Continue sertraline     Diet Order             Diet regular Room service appropriate? Yes; Fluid consistency: Thin  Diet effective now                            Consultants: Palliative care  Procedures: None    Medications:    apixaban  5 mg Oral BID   donepezil  10 mg Oral QHS   feeding supplement  237 mL Oral TID BM   hydrALAZINE  25 mg Oral Q8H   lisinopril  20 mg Oral Daily   And   hydrochlorothiazide  12.5 mg Oral Daily   melatonin  2.5 mg Oral QHS   multivitamin with minerals  1 tablet Oral Daily   potassium  chloride SA  20 mEq Oral Daily   sertraline  100 mg Oral Daily   Continuous Infusions:     Anti-infectives (From admission, onward)    None              Family Communication/Anticipated D/C date and plan/Code Status   DVT prophylaxis:  apixaban (ELIQUIS) tablet 5 mg     Code Status: Full Code  Family Communication: None Disposition Plan: Plan to discharge to SNF   Status is: Inpatient Remains inpatient appropriate because: Awaiting placement to SNF       Subjective:   Interval events noted.  He has no complaints.  Objective:    Vitals:   08/06/22 0254 08/06/22 0822 08/06/22 1003 08/06/22 1529  BP: (!) 143/69 (!) 170/77  (!) 164/89  Pulse: 74 74  74  Resp:  20  17  Temp:  98.6 F (37 C)  98.1 F (36.7 C)  TempSrc:      SpO2: 97% 97%  94%  Weight:   83.1 kg   Height:       No data found.   Intake/Output Summary (Last 24 hours) at 08/06/2022 1626 Last data filed at 08/06/2022 1053 Gross per 24 hour  Intake 120 ml  Output 0 ml  Net 120 ml   Filed Weights   08/03/22 1946 08/06/22 1003  Weight: 86.2 kg 83.1  kg    Exam:  GEN: NAD SKIN: No rash EYES: No pallor or icterus ENT: MMM CV: RRR PULM: CTA B ABD: soft, ND, NT, +BS CNS: AAO x 1 open on this person), non focal EXT: No edema or tenderness        Data Reviewed:   I have personally reviewed following labs and imaging studies:  Labs: Labs show the following:   Basic Metabolic Panel: Recent Labs  Lab 08/03/22 1958 08/03/22 2230 08/04/22 0455 08/06/22 1018  NA 139  --  139  --   K 3.2*  --  3.3* 3.6  CL 102  --  103  --   CO2 28  --  29  --   GLUCOSE 110*  --  94  --   BUN 18  --  18  --   CREATININE 1.12  --  0.98  --   CALCIUM 8.5*  --  8.8*  --   MG  --  2.2  --   --    GFR Estimated Creatinine Clearance: 60.5 mL/min (by C-G formula based on SCr of 0.98 mg/dL). Liver Function Tests: No results for input(s): "AST", "ALT", "ALKPHOS", "BILITOT", "PROT",  "ALBUMIN" in the last 168 hours. No results for input(s): "LIPASE", "AMYLASE" in the last 168 hours. No results for input(s): "AMMONIA" in the last 168 hours. Coagulation profile No results for input(s): "INR", "PROTIME" in the last 168 hours.  CBC: Recent Labs  Lab 08/03/22 1958 08/04/22 0455  WBC 6.5 7.1  HGB 11.6* 12.0*  HCT 35.7* 37.1*  MCV 84.8 84.3  PLT 188 179   Cardiac Enzymes: No results for input(s): "CKTOTAL", "CKMB", "CKMBINDEX", "TROPONINI" in the last 168 hours. BNP (last 3 results) No results for input(s): "PROBNP" in the last 8760 hours. CBG: No results for input(s): "GLUCAP" in the last 168 hours. D-Dimer: No results for input(s): "DDIMER" in the last 72 hours. Hgb A1c: No results for input(s): "HGBA1C" in the last 72 hours. Lipid Profile: No results for input(s): "CHOL", "HDL", "LDLCALC", "TRIG", "CHOLHDL", "LDLDIRECT" in the last 72 hours. Thyroid function studies: Recent Labs    08/04/22 0455  TSH 1.511   Anemia work up: No results for input(s): "VITAMINB12", "FOLATE", "FERRITIN", "TIBC", "IRON", "RETICCTPCT" in the last 72 hours. Sepsis Labs: Recent Labs  Lab 08/03/22 1958 08/04/22 0455  WBC 6.5 7.1    Microbiology Recent Results (from the past 240 hour(s))  Resp panel by RT-PCR (RSV, Flu A&B, Covid) Anterior Nasal Swab     Status: None   Collection Time: 08/03/22 11:05 PM   Specimen: Anterior Nasal Swab  Result Value Ref Range Status   SARS Coronavirus 2 by RT PCR NEGATIVE NEGATIVE Final    Comment: (NOTE) SARS-CoV-2 target nucleic acids are NOT DETECTED.  The SARS-CoV-2 RNA is generally detectable in upper respiratory specimens during the acute phase of infection. The lowest concentration of SARS-CoV-2 viral copies this assay can detect is 138 copies/mL. A negative result does not preclude SARS-Cov-2 infection and should not be used as the sole basis for treatment or other patient management decisions. A negative result may occur with   improper specimen collection/handling, submission of specimen other than nasopharyngeal swab, presence of viral mutation(s) within the areas targeted by this assay, and inadequate number of viral copies(<138 copies/mL). A negative result must be combined with clinical observations, patient history, and epidemiological information. The expected result is Negative.  Fact Sheet for Patients:  BloggerCourse.comhttps://www.fda.gov/media/152166/download  Fact Sheet for Healthcare Providers:  SeriousBroker.it  This test is no t yet approved or cleared by the [place] and  has been authorized for detection and/or diagnosis of SARS-CoV-2 by FDA under an Emergency Use Authorization (EUA). This EUA will remain  in effect (meaning this test can be used) for the duration of the COVID-19 declaration under Section 564(b)(1) of the Act, 21 U.S.C.section 360bbb-3(b)(1), unless the authorization is terminated  or revoked sooner.       Influenza A by PCR NEGATIVE NEGATIVE Final   Influenza B by PCR NEGATIVE NEGATIVE Final    Comment: (NOTE) The Xpert Xpress SARS-CoV-2/FLU/RSV plus assay is intended as an aid in the diagnosis of influenza from Nasopharyngeal swab specimens and should not be used as a sole basis for treatment. Nasal washings and aspirates are unacceptable for Xpert Xpress SARS-CoV-2/FLU/RSV testing.  Fact Sheet for Patients: BloggerCourse.com  Fact Sheet for Healthcare Providers: SeriousBroker.it  This test is not yet approved or cleared by the [place] FDA and has been authorized for detection and/or diagnosis of SARS-CoV-2 by FDA under an Emergency Use Authorization (EUA). This EUA will remain in effect (meaning this test can be used) for the duration of the COVID-19 declaration under Section 564(b)(1) of the Act, 21 U.S.C. section 360bbb-3(b)(1), unless the authorization is terminated or revoked.      Resp Syncytial Virus by PCR NEGATIVE NEGATIVE Final    Comment: (NOTE) Fact Sheet for Patients: BloggerCourse.com  Fact Sheet for Healthcare Providers: SeriousBroker.it  This test is not yet approved or cleared by the [place] FDA and has been authorized for detection and/or diagnosis of SARS-CoV-2 by FDA under an Emergency Use Authorization (EUA). This EUA will remain in effect (meaning this test can be used) for the duration of the COVID-19 declaration under Section 564(b)(1) of the Act, 21 U.S.C. section 360bbb-3(b)(1), unless the authorization is terminated or revoked.  Performed at [facility], [address] [place], [place] [address]     Procedures and diagnostic studies:  No results found.             LOS: 2 days   Kelvyn Schunk  [facility]   Pager on [email or web address]. If 7PM-7AM, please contact night-coverage at [email or web address]     08/06/2022, 4:26 PM

## 2022-08-06 NOTE — Progress Notes (Signed)
Occupational Therapy Treatment Patient Details Name: Jim Blake MRN: [id number] DOB: May 09, 1935 Today's Date: 08/06/2022   History of present illness 87 y.o. male with medical history significant for atrial fibrillation, anxiety and depression, hypertension and vitamin B12 deficiency, who presented to the emergency room with a Kalisetti of the worsening generalized weakness as well as altered mental status with confusion and decreased mobility at home per his family.  No reported fever or chills.  No reported nausea or vomiting or abdominal pain.  No chest pain or palpitations.  No dyspnea or cough or wheezing.  No dysuria, oliguria or hematuria or flank pain.  No bleeding diathesis.  The patient had an offensive urine order though that his family was thinking as possible UTI.  He has been having worsening cognitive decline in the setting of dementia   OT comments  Jim Blake was seen for OT treatment on this date. Upon arrival to room pt reclined in bed, agreeable to tx. Pt requires MOD A sup>sit. MOD A tooth brushing seated EOB, setup and hands on assist to initiate. MOD A urinal use in sitting, assist for sitting balance and urinal placement. MOD A sit<>stand x2. Pt making good progress toward goals, will continue to follow POC. Discharge recommendation remains appropriate.     Recommendations for follow up therapy are one component of a multi-disciplinary discharge planning process, led by the attending physician.  Recommendations may be updated based on patient status, additional functional criteria and insurance authorization.    Assistance Recommended at Discharge Frequent or constant Supervision/Assistance  Patient can return home with the following  A lot of help with bathing/dressing/bathroom;A lot of help with walking and/or transfers;Help with stairs or ramp for entrance;Assist for transportation;Assistance with cooking/housework   Equipment Recommendations  Other (comment) (defer)     Recommendations for Other Services      Precautions / Restrictions Precautions Precautions: Fall Restrictions Weight Bearing Restrictions: No       Mobility Bed Mobility Overal bed mobility: Needs Assistance Bed Mobility: Supine to Sit, Sit to Supine     Supine to sit: Mod assist Sit to supine: Min assist        Transfers Overall transfer level: Needs assistance Equipment used: Rolling walker (2 wheels) Transfers: Sit to/from Stand Sit to Stand: Mod assist                 Balance Overall balance assessment: Needs assistance Sitting-balance support: Feet supported Sitting balance-Leahy Scale: Fair     Standing balance support: Bilateral upper extremity supported Standing balance-Leahy Scale: Poor                             ADL either performed or assessed with clinical judgement   ADL Overall ADL's : Needs assistance/impaired                                       General ADL Comments: MOD A tooth brushing seated EOB, setup and hands on assist to initiate. MOD A urinal use in sitting, assist for sitting balance and urinal placement      Cognition Arousal/Alertness: Awake/alert Behavior During Therapy: Flat affect Overall Cognitive Status: Impaired/Different from baseline  General Comments: Pt is alert but confused and disoriented. no family present to determine baseline. pleasantly confused.                   Pertinent Vitals/ Pain       Pain Assessment Pain Assessment: No/denies pain Breathing: normal   Frequency  Min 1X/week        Progress Toward Goals  OT Goals(current goals can now be found in the care plan section)  Progress towards OT goals: Progressing toward goals  Acute Rehab OT Goals Patient Stated Goal: to go to rehab OT Goal Formulation: With patient/family Time For Goal Achievement: 08/18/22 Potential to Achieve Goals: Fair ADL Goals Pt  Will Perform Grooming: with supervision;standing Pt Will Perform Lower Body Dressing: with supervision;sit to/from stand Pt Will Transfer to Toilet: with supervision;ambulating Pt Will Perform Toileting - Clothing Manipulation and hygiene: with supervision;sit to/from stand  Plan Discharge plan remains appropriate;Frequency remains appropriate    Co-evaluation                 AM-PAC OT "6 Clicks" Daily Activity     Outcome Measure   Help from another person eating meals?: A Little Help from another person taking care of personal grooming?: A Little Help from another person toileting, which includes using toliet, bedpan, or urinal?: A Lot Help from another person bathing (including washing, rinsing, drying)?: A Lot Help from another person to put on and taking off regular upper body clothing?: A Little Help from another person to put on and taking off regular lower body clothing?: A Lot 6 Click Score: 15    End of Session    OT Visit Diagnosis: Unsteadiness on feet (R26.81);Repeated falls (R29.6);Muscle weakness (generalized) (M62.81);History of falling (Z91.81)   Activity Tolerance Patient tolerated treatment well   Patient Left in bed;with call bell/phone within reach;with bed alarm set   Nurse Communication          Time: 1859-0931 OT Time Calculation (min): 9 min  Charges: OT General Charges $OT Visit: 1 Visit OT Treatments $Self Care/Home Management : 8-22 mins  Kathie Dike, M.S. OTR/L  08/06/22, 3:02 PM  ascom [phone]

## 2022-08-07 DIAGNOSIS — R531 Weakness: Secondary | ICD-10-CM | POA: Diagnosis not present

## 2022-08-07 DIAGNOSIS — I480 Paroxysmal atrial fibrillation: Secondary | ICD-10-CM

## 2022-08-07 DIAGNOSIS — I10 Essential (primary) hypertension: Secondary | ICD-10-CM | POA: Diagnosis not present

## 2022-08-07 MED ORDER — AMLODIPINE BESYLATE 5 MG PO TABS
5.0000 mg | ORAL_TABLET | Freq: Every day | ORAL | Status: DC
  Administered 2022-08-07 – 2022-08-12 (×6): 5 mg via ORAL
  Filled 2022-08-07 (×6): qty 1

## 2022-08-07 NOTE — Plan of Care (Signed)
  Problem: Education: Goal: Knowledge of General Education information will improve Description: Including pain rating scale, medication(s)/side effects and non-pharmacologic comfort measures Outcome: Progressing   Problem: Activity: Goal: Risk for activity intolerance will decrease Outcome: Progressing   Problem: Elimination: Goal: Will not experience complications related to bowel motility Outcome: Progressing   Problem: Skin Integrity: Goal: Risk for impaired skin integrity will decrease Outcome: Progressing   Problem: Pain Managment: Goal: General experience of comfort will improve Outcome: Progressing

## 2022-08-07 NOTE — Progress Notes (Signed)
Progress Note    Jim Blake  EYC:[id number] DOB: Sep 27, 1935  DOA: 08/03/2022 PCP: Dorothey Baseman, MD      Brief Narrative:    Medical records reviewed and are as summarized below:  Jim Blake is a 87 y.o. male with history significant for paroxysmal atrial fibrillation, anxiety, depression, hypertension, vitamin B12 deficiency who presents to the ER with chief complaint of altered mentation and decreased level mobility.      Assessment/Plan:   Principal Problem:   Generalized weakness Active Problems:   Essential hypertension   Altered mental status   Hypokalemia   Paroxysmal atrial fibrillation   Dementia with behavioral disturbance   Depression   Malnutrition of moderate degree   Nutrition Problem: Moderate Malnutrition Etiology: social / environmental circumstances  Signs/Symptoms: mild fat depletion, moderate fat depletion, mild muscle depletion, moderate muscle depletion   Body mass index is 24.85 kg/m.   General weakness, adult failure to thrive, change in mental status: Mental status suspected to be due to progression of dementia.  Plan to discharge to SNF.     Hypokalemia: Improved   Paroxysmal atrial fibrillation: Continue Eliquis   Hypertension, hypertensive urgency: BP is significantly elevated.  Add amlodipine.  Continue hydralazine, lisinopril, HCTZ   Dementia with behavioral disturbance: Continue donepezil and supportive care   Depression: Continue sertraline     Diet Order             Diet regular Room service appropriate? Yes; Fluid consistency: Thin  Diet effective now                            Consultants: Palliative care  Procedures: None    Medications:    amLODipine  5 mg Oral Daily   apixaban  5 mg Oral BID   donepezil  10 mg Oral QHS   feeding supplement  237 mL Oral TID BM   hydrALAZINE  25 mg Oral Q8H   lisinopril  20 mg Oral Daily   And   hydrochlorothiazide  12.5 mg Oral Daily    melatonin  2.5 mg Oral QHS   multivitamin with minerals  1 tablet Oral Daily   potassium chloride SA  20 mEq Oral Daily   sertraline  100 mg Oral Daily   Continuous Infusions:     Anti-infectives (From admission, onward)    None              Family Communication/Anticipated D/C date and plan/Code Status   DVT prophylaxis:  apixaban (ELIQUIS) tablet 5 mg     Code Status: Full Code  Family Communication: None Disposition Plan: Plan to discharge to SNF   Status is: Inpatient Remains inpatient appropriate because: Awaiting placement to SNF       Subjective:   Interval events noted.  He has no complaints.  No shortness of breath or chest pain  Objective:    Vitals:   08/07/22 0113 08/07/22 0751 08/07/22 0815 08/07/22 1512  BP: (!) 171/86 (!) 171/126 (!) 171/72 126/70  Pulse: (!) 56 (!) 57 60 77  Resp:  18  16  Temp:  98.1 F (36.7 C)  97.7 F (36.5 C)  TempSrc:      SpO2:  97%  94%  Weight:      Height:       No data found.  No intake or output data in the 24 hours ending 08/07/22 1549  Filed Weights   08/03/22 1946  08/06/22 1003  Weight: 86.2 kg 83.1 kg    Exam:  GEN: NAD SKIN: Warm and dry EYES: Anicteric ENT: MMM CV: RRR PULM: CTA B ABD: soft, ND, NT, +BS CNS: Alert and oriented to person, non focal EXT: No edema or tenderness       Data Reviewed:   I have personally reviewed following labs and imaging studies:  Labs: Labs show the following:   Basic Metabolic Panel: Recent Labs  Lab 08/03/22 1958 08/03/22 2230 08/04/22 0455 08/06/22 1018  NA 139  --  139  --   K 3.2*  --  3.3* 3.6  CL 102  --  103  --   CO2 28  --  29  --   GLUCOSE 110*  --  94  --   BUN 18  --  18  --   CREATININE 1.12  --  0.98  --   CALCIUM 8.5*  --  8.8*  --   MG  --  2.2  --   --    GFR Estimated Creatinine Clearance: 60.5 mL/min (by C-G formula based on SCr of 0.98 mg/dL). Liver Function Tests: No results for input(s): "AST",  "ALT", "ALKPHOS", "BILITOT", "PROT", "ALBUMIN" in the last 168 hours. No results for input(s): "LIPASE", "AMYLASE" in the last 168 hours. No results for input(s): "AMMONIA" in the last 168 hours. Coagulation profile No results for input(s): "INR", "PROTIME" in the last 168 hours.  CBC: Recent Labs  Lab 08/03/22 1958 08/04/22 0455  WBC 6.5 7.1  HGB 11.6* 12.0*  HCT 35.7* 37.1*  MCV 84.8 84.3  PLT 188 179   Cardiac Enzymes: No results for input(s): "CKTOTAL", "CKMB", "CKMBINDEX", "TROPONINI" in the last 168 hours. BNP (last 3 results) No results for input(s): "PROBNP" in the last 8760 hours. CBG: No results for input(s): "GLUCAP" in the last 168 hours. D-Dimer: No results for input(s): "DDIMER" in the last 72 hours. Hgb A1c: No results for input(s): "HGBA1C" in the last 72 hours. Lipid Profile: No results for input(s): "CHOL", "HDL", "LDLCALC", "TRIG", "CHOLHDL", "LDLDIRECT" in the last 72 hours. Thyroid function studies: No results for input(s): "TSH", "T4TOTAL", "T3FREE", "THYROIDAB" in the last 72 hours.  Invalid input(s): "FREET3"  Anemia work up: No results for input(s): "VITAMINB12", "FOLATE", "FERRITIN", "TIBC", "IRON", "RETICCTPCT" in the last 72 hours. Sepsis Labs: Recent Labs  Lab 08/03/22 1958 08/04/22 0455  WBC 6.5 7.1    Microbiology Recent Results (from the past 240 hour(s))  Resp panel by RT-PCR (RSV, Flu A&B, Covid) Anterior Nasal Swab     Status: None   Collection Time: 08/03/22 11:05 PM   Specimen: Anterior Nasal Swab  Result Value Ref Range Status   SARS Coronavirus 2 by RT PCR NEGATIVE NEGATIVE Final    Comment: (NOTE) SARS-CoV-2 target nucleic acids are NOT DETECTED.  The SARS-CoV-2 RNA is generally detectable in upper respiratory specimens during the acute phase of infection. The lowest concentration of SARS-CoV-2 viral copies this assay can detect is 138 copies/mL. A negative result does not preclude SARS-Cov-2 infection and should not be  used as the sole basis for treatment or other patient management decisions. A negative result may occur with  improper specimen collection/handling, submission of specimen other than nasopharyngeal swab, presence of viral mutation(s) within the areas targeted by this assay, and inadequate number of viral copies(<138 copies/mL). A negative result must be combined with clinical observations, patient history, and epidemiological information. The expected result is Negative.  Fact Sheet for Patients:  BloggerCourse.com  Fact Sheet for Healthcare Providers:  SeriousBroker.it  This test is no t yet approved or cleared by the [place] FDA and  has been authorized for detection and/or diagnosis of SARS-CoV-2 by FDA under an Emergency Use Authorization (EUA). This EUA will remain  in effect (meaning this test can be used) for the duration of the COVID-19 declaration under Section 564(b)(1) of the Act, 21 U.S.C.section 360bbb-3(b)(1), unless the authorization is terminated  or revoked sooner.       Influenza A by PCR NEGATIVE NEGATIVE Final   Influenza B by PCR NEGATIVE NEGATIVE Final    Comment: (NOTE) The Xpert Xpress SARS-CoV-2/FLU/RSV plus assay is intended as an aid in the diagnosis of influenza from Nasopharyngeal swab specimens and should not be used as a sole basis for treatment. Nasal washings and aspirates are unacceptable for Xpert Xpress SARS-CoV-2/FLU/RSV testing.  Fact Sheet for Patients: BloggerCourse.com  Fact Sheet for Healthcare Providers: SeriousBroker.it  This test is not yet approved or cleared by the [place] FDA and has been authorized for detection and/or diagnosis of SARS-CoV-2 by FDA under an Emergency Use Authorization (EUA). This EUA will remain in effect (meaning this test can be used) for the duration of the COVID-19 declaration under Section  564(b)(1) of the Act, 21 U.S.C. section 360bbb-3(b)(1), unless the authorization is terminated or revoked.     Resp Syncytial Virus by PCR NEGATIVE NEGATIVE Final    Comment: (NOTE) Fact Sheet for Patients: BloggerCourse.com  Fact Sheet for Healthcare Providers: SeriousBroker.it  This test is not yet approved or cleared by the [place] FDA and has been authorized for detection and/or diagnosis of SARS-CoV-2 by FDA under an Emergency Use Authorization (EUA). This EUA will remain in effect (meaning this test can be used) for the duration of the COVID-19 declaration under Section 564(b)(1) of the Act, 21 U.S.C. section 360bbb-3(b)(1), unless the authorization is terminated or revoked.  Performed at [facility], [address] [place], [place] [address]     Procedures and diagnostic studies:  No results found.             LOS: 3 days   Naveen Clardy  [facility]   Pager on [email or web address]. If 7PM-7AM, please contact night-coverage at [email or web address]     08/07/2022, 3:49 PM

## 2022-08-07 NOTE — Plan of Care (Signed)
  Problem: Health Behavior/Discharge Planning: Goal: Ability to manage health-related needs will improve Outcome: Progressing   Problem: Clinical Measurements: Goal: Ability to maintain clinical measurements within normal limits will improve Outcome: Progressing Goal: Will remain free from infection Outcome: Progressing   Problem: Activity: Goal: Risk for activity intolerance will decrease Outcome: Progressing   Problem: Elimination: Goal: Will not experience complications related to bowel motility Outcome: Progressing Goal: Will not experience complications related to urinary retention Outcome: Progressing   Problem: Safety: Goal: Ability to remain free from injury will improve Outcome: Progressing

## 2022-08-07 NOTE — Progress Notes (Signed)
Palliative: Chart review completed.  Jim Blake family is reviewing bed offers for short-term rehab.   Palliative team to continue to follow for support.  Conference with with bedside nursing staff and transition of care team related to patient condition and needs.  No charge [name], NP Palliative medicine team Team phone [phone] Greater than 50% of this time was spent counseling and coordinating care related to the above assessment and plan.

## 2022-08-07 NOTE — Care Management Important Message (Signed)
Important Message  Patient Details  Name: Jim Blake MRN: [id number] Date of Birth: [birth year]   Medicare Important Message Given:  N/A - LOS <3 / Initial given by admissions     Olegario Messier A Shamir Tuzzolino 08/07/2022, 8:46 AM

## 2022-08-07 NOTE — TOC Progression Note (Signed)
Transition of Care [facility]) - Progression Note    Patient Details  Name: Jim Blake MRN: [id number] Date of Birth: 1936/03/10  Transition [facility]) CM/SW Contact  Garret Reddish, RN Phone Number: 08/07/2022, 12:08 PM  Clinical Narrative:   Bed offers given to patient's son.  No bed offers in the [place] area at this time. All bed offers where made from the [place] area.  Patient's son will speak with patient's wife and daughter and inform me of which bed they chose. [facility] is assisting with placement also for Assisted Living.  I have spoken with patient's son about how to apply for Medicaid to pay for Assisted Living/Memory Care unit and long term care.  Once family has choose a facility will submit for SNF authorization.    TOC will continue to follow for discharge planning.        Expected Discharge Plan: Skilled Nursing Facility Barriers to Discharge: Continued Medical Work up  Expected Discharge Plan and Services       Living arrangements for the past 2 months: Single Family Home                                       Social Determinants of Health (SDOH) Interventions SDOH Screenings   Food Insecurity: No Food Insecurity (08/04/2022)  Housing: Low Risk  (08/04/2022)  Transportation Needs: No Transportation Needs (08/04/2022)  Utilities: Not At Risk (08/04/2022)  Tobacco Use: High Risk (08/06/2022)    Readmission Risk Interventions     No data to display

## 2022-08-07 NOTE — Progress Notes (Signed)
Physical Therapy Treatment Patient Details Name: Jim Blake MRN: [id number] DOB: Jun 25, 1935 Today's Date: 08/07/2022   History of Present Illness 86 y.o. male with medical history significant for atrial fibrillation, anxiety and depression, hypertension and vitamin B12 deficiency, who presented to the emergency room with a [place] of the worsening generalized weakness as well as altered mental status with confusion and decreased mobility at home per his family.  No reported fever or chills.  No reported nausea or vomiting or abdominal pain.  No chest pain or palpitations.  No dyspnea or cough or wheezing.  No dysuria, oliguria or hematuria or flank pain.  No bleeding diathesis.  The patient had an offensive urine order though that his family was thinking as possible UTI.  He has been having worsening cognitive decline in the setting of dementia    PT Comments    Pt was sitting in recliner upon arrival. No family present however Son did arrive during session. Session greatly impacted by pt's cognition and abilities to consistently follow commands. He was cooperative however. Pt requires more assistance today to initiate movements and to perform desired task requested of him. He needed assistance with RW progression and during turning with gait training. Pt is progressing however slowly. Highly recommend continued skilled PT going forward at DC to maximize independence and safety with all ADLs.    Recommendations for follow up therapy are one component of a multi-disciplinary discharge planning process, led by the attending physician.  Recommendations may be updated based on patient status, additional functional criteria and insurance authorization.     Assistance Recommended at Discharge Frequent or constant Supervision/Assistance  Patient can return home with the following A lot of help with walking and/or transfers;A lot of help with bathing/dressing/bathroom;Assistance with  cooking/housework;Assistance with feeding;Direct supervision/assist for medications management;Direct supervision/assist for financial management;Assist for transportation;Help with stairs or ramp for entrance   Equipment Recommendations  Other (comment) (Defer to next level of care)       Precautions / Restrictions Precautions Precautions: Fall Restrictions Weight Bearing Restrictions: No     Mobility  Bed Mobility  General bed mobility comments: In recliner pre/post session    Transfers Overall transfer level: Needs assistance Equipment used: Rolling walker (2 wheels) Transfers: Sit to/from Stand Sit to Stand: Mod assist  General transfer comment: pt struggles with motor planning and sequencing. Poor initiation of movements    Ambulation/Gait Ambulation/Gait assistance: Min assist, Mod assist Gait Distance (Feet): 50 Feet Assistive device: Rolling walker (2 wheels) Gait Pattern/deviations: Trunk flexed, Step-to pattern Gait velocity: decreased  General Gait Details: pt required more assistance today but wa able to advance gait distances.    Balance Overall balance assessment: Needs assistance Sitting-balance support: Feet supported Sitting balance-Leahy Scale: Fair     Standing balance support: Bilateral upper extremity supported Standing balance-Leahy Scale: Poor Standing balance comment: Extremely high fall risk       Cognition Arousal/Alertness: Awake/alert Behavior During Therapy: Flat affect Overall Cognitive Status: Impaired/Different from baseline    General Comments: Pt is alert but confused and disoriented. Family arrived towards end of session, endorses that cognition has been poor for some time.               Pertinent Vitals/Pain Pain Assessment Pain Assessment: No/denies pain     PT Goals (current goals can now be found in the care plan section) Acute Rehab PT Goals Patient Stated Goal: Pt remains alert however disoriented and confused. Wa  able to follow commands however somewhat  inconsistently Progress towards PT goals: Progressing toward goals (cognition greatly impacts session progression. Pt was cooperative and did fully participate)    Frequency    Min 2X/week      PT Plan Current plan remains appropriate       AM-PAC PT "6 Clicks" Mobility   Outcome Measure  Help needed turning from your back to your side while in a flat bed without using bedrails?: A Lot Help needed moving from lying on your back to sitting on the side of a flat bed without using bedrails?: A Lot Help needed moving to and from a bed to a chair (including a wheelchair)?: A Lot Help needed standing up from a chair using your arms (e.g., wheelchair or bedside chair)?: A Lot Help needed to walk in hospital room?: A Lot Help needed climbing 3-5 steps with a railing? : A Lot 6 Click Score: 12    End of Session Equipment Utilized During Treatment: Gait belt Activity Tolerance: Patient tolerated treatment well;Patient limited by fatigue;Other (comment) (Cognition greatly impacts session progression.) Patient left: in chair;with call bell/phone within reach;with chair alarm set Nurse Communication: Mobility status PT Visit Diagnosis: Unsteadiness on feet (R26.81);Muscle weakness (generalized) (M62.81);Difficulty in walking, not elsewhere classified (R26.2)     Time: 0102-7253 PT Time Calculation (min) (ACUTE ONLY): 17 min  Charges:  $Gait Training: 8-22 mins                    Jetta Lout PTA 08/07/22, 1:06 PM

## 2022-08-08 DIAGNOSIS — R531 Weakness: Secondary | ICD-10-CM | POA: Diagnosis not present

## 2022-08-08 DIAGNOSIS — I480 Paroxysmal atrial fibrillation: Secondary | ICD-10-CM | POA: Diagnosis not present

## 2022-08-08 DIAGNOSIS — F03918 Unspecified dementia, unspecified severity, with other behavioral disturbance: Secondary | ICD-10-CM | POA: Diagnosis not present

## 2022-08-08 DIAGNOSIS — E876 Hypokalemia: Secondary | ICD-10-CM | POA: Diagnosis not present

## 2022-08-08 DIAGNOSIS — Z7189 Other specified counseling: Secondary | ICD-10-CM | POA: Diagnosis not present

## 2022-08-08 DIAGNOSIS — Z515 Encounter for palliative care: Secondary | ICD-10-CM | POA: Diagnosis not present

## 2022-08-08 LAB — POTASSIUM: Potassium: 3.3 mmol/L — ABNORMAL LOW (ref 3.5–5.1)

## 2022-08-08 MED ORDER — POTASSIUM CHLORIDE CRYS ER 20 MEQ PO TBCR
20.0000 meq | EXTENDED_RELEASE_TABLET | Freq: Once | ORAL | Status: AC
  Administered 2022-08-08: 20 meq via ORAL
  Filled 2022-08-08: qty 1

## 2022-08-08 NOTE — Progress Notes (Signed)
Progress Note    Mylin Hirano  WUJ:[id number] DOB: [birth year]  DOA: 08/03/2022 PCP: Dorothey Baseman, MD      Brief Narrative:    Medical records reviewed and are as summarized below:  Jim Mcaffee is a 87 y.o. male with history significant for paroxysmal atrial fibrillation, anxiety, depression, hypertension, vitamin B12 deficiency who presents to the ER with chief complaint of altered mentation and decreased level mobility.      Assessment/Plan:   Principal Problem:   Generalized weakness Active Problems:   Essential hypertension   Altered mental status   Hypokalemia   Paroxysmal atrial fibrillation   Dementia with behavioral disturbance   Depression   Malnutrition of moderate degree   Nutrition Problem: Moderate Malnutrition Etiology: social / environmental circumstances  Signs/Symptoms: mild fat depletion, moderate fat depletion, mild muscle depletion, moderate muscle depletion   Body mass index is 24.85 kg/m.   General weakness, adult failure to thrive, change in mental status: Mental status suspected to be due to progression of dementia.  Plan to discharge to SNF.     Hypokalemia: Replete potassium and monitor levels   Paroxysmal atrial fibrillation: Continue Eliquis   Hypertension, hypertensive urgency: Continue antihypertensives.     Dementia with behavioral disturbance: Continue donepezil and supportive care   Depression: Continue sertraline     Diet Order             Diet regular Room service appropriate? Yes; Fluid consistency: Thin  Diet effective now                            Consultants: Palliative care  Procedures: None    Medications:    amLODipine  5 mg Oral Daily   apixaban  5 mg Oral BID   donepezil  10 mg Oral QHS   feeding supplement  237 mL Oral TID BM   hydrALAZINE  25 mg Oral Q8H   lisinopril  20 mg Oral Daily   And   hydrochlorothiazide  12.5 mg Oral Daily   melatonin  2.5 mg Oral QHS    multivitamin with minerals  1 tablet Oral Daily   potassium chloride SA  20 mEq Oral Daily   sertraline  100 mg Oral Daily   Continuous Infusions:     Anti-infectives (From admission, onward)    None              Family Communication/Anticipated D/C date and plan/Code Status   DVT prophylaxis:  apixaban (ELIQUIS) tablet 5 mg     Code Status: Full Code  Family Communication: None Disposition Plan: Plan to discharge to SNF   Status is: Inpatient Remains inpatient appropriate because: Awaiting placement to SNF       Subjective:   Interval events noted.  He has no complaints.  Objective:    Vitals:   08/07/22 0815 08/07/22 1512 08/07/22 2345 08/08/22 0817  BP: (!) 171/72 126/70 (!) 152/80 (!) 161/90  Pulse: 60 77 78 (!) 58  Resp:  Temp:  97.7 F (36.5 C) 98.2 F (36.8 C) 97.7 F (36.5 C)  TempSrc:      SpO2:  94% 94% 95%  Weight:      Height:       No data found.   Intake/Output Summary (Last 24 hours) at 08/08/2022 1224 Last data filed at 08/08/2022 0547 Gross per 24 hour  Intake 340 ml  Output 600 ml  Net -  260 ml    Filed Weights   08/03/22 1946 08/06/22 1003  Weight: 86.2 kg 83.1 kg    Exam:    GEN: NAD SKIN: Warm and dry EYES: No pallor or icterus ENT: MMM CV: RRR PULM: CTA B ABD: soft, ND, NT, +BS CNS: AAO x 1, non focal EXT: No edema or tenderness   Data Reviewed:   I have personally reviewed following labs and imaging studies:  Labs: Labs show the following:   Basic Metabolic Panel: Recent Labs  Lab 08/03/22 1958 08/03/22 2230 08/04/22 0455 08/06/22 1018 08/08/22 0838  NA 139  --  139  --   --   K 3.2*  --  3.3* 3.6 3.3*  CL 102  --  103  --   --   CO2 28  --  29  --   --   GLUCOSE 110*  --  94  --   --   BUN 18  --  18  --   --   CREATININE 1.12  --  0.98  --   --   CALCIUM 8.5*  --  8.8*  --   --   MG  --  2.2  --   --   --    GFR Estimated Creatinine Clearance: 60.5 mL/min (by C-G  formula based on SCr of 0.98 mg/dL). Liver Function Tests: No results for input(s): "AST", "ALT", "ALKPHOS", "BILITOT", "PROT", "ALBUMIN" in the last 168 hours. No results for input(s): "LIPASE", "AMYLASE" in the last 168 hours. No results for input(s): "AMMONIA" in the last 168 hours. Coagulation profile No results for input(s): "INR", "PROTIME" in the last 168 hours.  CBC: Recent Labs  Lab 08/03/22 1958 08/04/22 0455  WBC 6.5 7.1  HGB 11.6* 12.0*  HCT 35.7* 37.1*  MCV 84.8 84.3  PLT 188 179   Cardiac Enzymes: No results for input(s): "CKTOTAL", "CKMB", "CKMBINDEX", "TROPONINI" in the last 168 hours. BNP (last 3 results) No results for input(s): "PROBNP" in the last 8760 hours. CBG: No results for input(s): "GLUCAP" in the last 168 hours. D-Dimer: No results for input(s): "DDIMER" in the last 72 hours. Hgb A1c: No results for input(s): "HGBA1C" in the last 72 hours. Lipid Profile: No results for input(s): "CHOL", "HDL", "LDLCALC", "TRIG", "CHOLHDL", "LDLDIRECT" in the last 72 hours. Thyroid function studies: No results for input(s): "TSH", "T4TOTAL", "T3FREE", "THYROIDAB" in the last 72 hours.  Invalid input(s): "FREET3"  Anemia work up: No results for input(s): "VITAMINB12", "FOLATE", "FERRITIN", "TIBC", "IRON", "RETICCTPCT" in the last 72 hours. Sepsis Labs: Recent Labs  Lab 08/03/22 1958 08/04/22 0455  WBC 6.5 7.1    Microbiology Recent Results (from the past 240 hour(s))  Resp panel by RT-PCR (RSV, Flu A&B, Covid) Anterior Nasal Swab     Status: None   Collection Time: 08/03/22 11:05 PM   Specimen: Anterior Nasal Swab  Result Value Ref Range Status   SARS Coronavirus 2 by RT PCR NEGATIVE NEGATIVE Final    Comment: (NOTE) SARS-CoV-2 target nucleic acids are NOT DETECTED.  The SARS-CoV-2 RNA is generally detectable in upper respiratory specimens during the acute phase of infection. The lowest concentration of SARS-CoV-2 viral copies this assay can detect  is 138 copies/mL. A negative result does not preclude SARS-Cov-2 infection and should not be used as the sole basis for treatment or other patient management decisions. A negative result may occur with  improper specimen collection/handling, submission of specimen other than nasopharyngeal swab, presence of viral mutation(s) within the  areas targeted by this assay, and inadequate number of viral copies(<138 copies/mL). A negative result must be combined with clinical observations, patient history, and epidemiological information. The expected result is Negative.  Fact Sheet for Patients:  BloggerCourse.com  Fact Sheet for Healthcare Providers:  SeriousBroker.it  This test is no t yet approved or cleared by the [place] FDA and  has been authorized for detection and/or diagnosis of SARS-CoV-2 by FDA under an Emergency Use Authorization (EUA). This EUA will remain  in effect (meaning this test can be used) for the duration of the COVID-19 declaration under Section 564(b)(1) of the Act, 21 U.S.C.section 360bbb-3(b)(1), unless the authorization is terminated  or revoked sooner.       Influenza A by PCR NEGATIVE NEGATIVE Final   Influenza B by PCR NEGATIVE NEGATIVE Final    Comment: (NOTE) The Xpert Xpress SARS-CoV-2/FLU/RSV plus assay is intended as an aid in the diagnosis of influenza from Nasopharyngeal swab specimens and should not be used as a sole basis for treatment. Nasal washings and aspirates are unacceptable for Xpert Xpress SARS-CoV-2/FLU/RSV testing.  Fact Sheet for Patients: BloggerCourse.com  Fact Sheet for Healthcare Providers: SeriousBroker.it  This test is not yet approved or cleared by the [place] FDA and has been authorized for detection and/or diagnosis of SARS-CoV-2 by FDA under an Emergency Use Authorization (EUA). This EUA will remain in effect  (meaning this test can be used) for the duration of the COVID-19 declaration under Section 564(b)(1) of the Act, 21 U.S.C. section 360bbb-3(b)(1), unless the authorization is terminated or revoked.     Resp Syncytial Virus by PCR NEGATIVE NEGATIVE Final    Comment: (NOTE) Fact Sheet for Patients: BloggerCourse.com  Fact Sheet for Healthcare Providers: SeriousBroker.it  This test is not yet approved or cleared by the [place] FDA and has been authorized for detection and/or diagnosis of SARS-CoV-2 by FDA under an Emergency Use Authorization (EUA). This EUA will remain in effect (meaning this test can be used) for the duration of the COVID-19 declaration under Section 564(b)(1) of the Act, 21 U.S.C. section 360bbb-3(b)(1), unless the authorization is terminated or revoked.  Performed at [facility], [address] [place], [place] [address]     Procedures and diagnostic studies:  No results found.             LOS: 4 days   Nataley Bahri  [facility]   Pager on [email or web address]. If 7PM-7AM, please contact night-coverage at [email or web address]     08/08/2022, 12:24 PM

## 2022-08-08 NOTE — TOC Progression Note (Signed)
Transition of Care [facility]) - Progression Note    Patient Details  Name: Jim Blake MRN: [id number] Date of Birth: 1935-05-13  [facility] of [facility] CM/SW Contact  Garret Reddish, RN Phone Number: 08/08/2022, 3:03 PM  Clinical Narrative:   [name] not able to offer a bed at this time.  Family will tour facilities over the weekend and make [facility] team member aware of the bed offer they would like to accept.  Patient will need insurance authorization once SNF bed is selected.    TOC will continue to follow for discharge planning.     Expected Discharge Plan: Skilled Nursing Facility Barriers to Discharge: Continued Medical Work up  Expected Discharge Plan and Services       Living arrangements for the past 2 months: Single Family Home                                       Social Determinants of Health (SDOH) Interventions SDOH Screenings   Food Insecurity: No Food Insecurity (08/04/2022)  Housing: Low Risk  (08/04/2022)  Transportation Needs: No Transportation Needs (08/04/2022)  Utilities: Not At Risk (08/04/2022)  Tobacco Use: High Risk (08/06/2022)    Readmission Risk Interventions     No data to display

## 2022-08-08 NOTE — Progress Notes (Signed)
Palliative: Jim Blake is resting quietly in bed.  He appears acutely/chronically ill, frail and elderly.  He is alert, making and somewhat keeping eye contact.  He has known dementia, but is able to tell me his name.  I do not believe that he can make his basic needs known.  He is unable to tell me who is with him whether through relationship or name.  His son, Jim Blake, is present at bedside.   Jim Blake and I talk in detail about Jim Blake chronic illness burden related to memory loss.  We again today talk about memory loss as a progressive illness which affects the mind and the ability to problem solve/communicate, nutritional deficits, physical deficits.  We talk about going to short-term rehab.  Jim Blake states that they are hopeful that Jim Blake can return home, but are preparing for memory care as a possibility.  He tells me that they are working for [facility] to help with funding.  We talk about outpatient palliative services to continue goals of care discussions.  Jim Blake states that he will continue to talk with his family about these choices. We also talk about CODE STATUS.  Jim Blake states that he and his sister have talked and they would endorse DNR, but their mother, Jim Blake wife, is not ready for this.  I again encouraged outpatient palliative services for support.  I share that if and when they are ready they can talk with the social worker at rehab or PCP.  Conference with attending, bedside nursing staff, transition of care team related to patient condition, needs, goals of care, disposition.  Plan: Short-term rehab with possible need for memory care.  Ultimate goal would be to return home if possible.  Continue full scope/full code.  Considering outpatient palliative care.  50 minutes [name], NP Palliative medicine team Team phone [phone] Greater than 50% of this time was spent counseling and coordinating care related to the above assessment and plan.

## 2022-08-08 NOTE — Care Management Important Message (Signed)
Important Message  Patient Details  Name: Jim Blake MRN: [id number] Date of Birth: 05/09/1935   Medicare Important Message Given:  Yes     Olegario Messier A Jarret Torre 08/08/2022, 2:47 PM

## 2022-08-08 NOTE — Plan of Care (Signed)
  Problem: Education: Goal: Knowledge of General Education information will improve Description: Including pain rating scale, medication(s)/side effects and non-pharmacologic comfort measures Outcome: Progressing   Problem: Health Behavior/Discharge Planning: Goal: Ability to manage health-related needs will improve Outcome: Progressing   Problem: Clinical Measurements: Goal: Ability to maintain clinical measurements within normal limits will improve Outcome: Progressing   Problem: Activity: Goal: Risk for activity intolerance will decrease Outcome: Progressing   Problem: Nutrition: Goal: Adequate nutrition will be maintained Outcome: Progressing   Problem: Elimination: Goal: Will not experience complications related to bowel motility Outcome: Progressing   Problem: Pain Managment: Goal: General experience of comfort will improve Outcome: Progressing   

## 2022-08-09 DIAGNOSIS — R531 Weakness: Secondary | ICD-10-CM | POA: Diagnosis not present

## 2022-08-09 LAB — CBC
HCT: 37.4 % — ABNORMAL LOW (ref 39.0–52.0)
Hemoglobin: 12.2 g/dL — ABNORMAL LOW (ref 13.0–17.0)
MCH: 27.2 pg (ref 26.0–34.0)
MCHC: 32.6 g/dL (ref 30.0–36.0)
MCV: 83.5 fL (ref 80.0–100.0)
Platelets: 220 10*3/uL (ref 150–400)
RBC: 4.48 MIL/uL (ref 4.22–5.81)
RDW: 13.9 % (ref 11.5–15.5)
WBC: 6.6 10*3/uL (ref 4.0–10.5)
nRBC: 0 % (ref 0.0–0.2)

## 2022-08-09 NOTE — Plan of Care (Signed)
  Problem: Education: Goal: Knowledge of General Education information will improve Description: Including pain rating scale, medication(s)/side effects and non-pharmacologic comfort measures 08/09/2022 0158 by Pamala Duffel, RN Outcome: Progressing 08/09/2022 0158 by Pamala Duffel, RN Outcome: Progressing   Problem: Health Behavior/Discharge Planning: Goal: Ability to manage health-related needs will improve 08/09/2022 0158 by Pamala Duffel, RN Outcome: Progressing 08/09/2022 0158 by Pamala Duffel, RN Outcome: Progressing   Problem: Clinical Measurements: Goal: Ability to maintain clinical measurements within normal limits will improve 08/09/2022 0158 by Pamala Duffel, RN Outcome: Progressing 08/09/2022 0158 by Pamala Duffel, RN Outcome: Progressing Goal: Will remain free from infection 08/09/2022 0158 by Pamala Duffel, RN Outcome: Progressing 08/09/2022 0158 by Pamala Duffel, RN Outcome: Progressing Goal: Diagnostic test results will improve 08/09/2022 0158 by Pamala Duffel, RN Outcome: Progressing 08/09/2022 0158 by Pamala Duffel, RN Outcome: Progressing Goal: Respiratory complications will improve 08/09/2022 0158 by Pamala Duffel, RN Outcome: Progressing 08/09/2022 0158 by Pamala Duffel, RN Outcome: Progressing Goal: Cardiovascular complication will be avoided 08/09/2022 0158 by Pamala Duffel, RN Outcome: Progressing 08/09/2022 0158 by Pamala Duffel, RN Outcome: Progressing   Problem: Activity: Goal: Risk for activity intolerance will decrease 08/09/2022 0158 by Pamala Duffel, RN Outcome: Progressing 08/09/2022 0158 by Pamala Duffel, RN Outcome: Progressing   Problem: Nutrition: Goal: Adequate nutrition will be maintained 08/09/2022 0158 by Pamala Duffel, RN Outcome: Progressing 08/09/2022 0158 by Pamala Duffel, RN Outcome: Progressing   Problem:  Coping: Goal: Level of anxiety will decrease 08/09/2022 0158 by Pamala Duffel, RN Outcome: Progressing 08/09/2022 0158 by Pamala Duffel, RN Outcome: Progressing   Problem: Elimination: Goal: Will not experience complications related to bowel motility 08/09/2022 0158 by Pamala Duffel, RN Outcome: Progressing 08/09/2022 0158 by Pamala Duffel, RN Outcome: Progressing Goal: Will not experience complications related to urinary retention 08/09/2022 0158 by Pamala Duffel, RN Outcome: Progressing 08/09/2022 0158 by Pamala Duffel, RN Outcome: Progressing   Problem: Pain Managment: Goal: General experience of comfort will improve 08/09/2022 0158 by Pamala Duffel, RN Outcome: Progressing 08/09/2022 0158 by Pamala Duffel, RN Outcome: Progressing   Problem: Safety: Goal: Ability to remain free from injury will improve 08/09/2022 0158 by Pamala Duffel, RN Outcome: Progressing 08/09/2022 0158 by Pamala Duffel, RN Outcome: Progressing   Problem: Skin Integrity: Goal: Risk for impaired skin integrity will decrease 08/09/2022 0158 by Pamala Duffel, RN Outcome: Progressing 08/09/2022 0158 by Pamala Duffel, RN Outcome: Progressing

## 2022-08-09 NOTE — Plan of Care (Signed)
  Problem: Education: Goal: Knowledge of General Education information will improve Description: Including pain rating scale, medication(s)/side effects and non-pharmacologic comfort measures Outcome: Progressing   Problem: Health Behavior/Discharge Planning: Goal: Ability to manage health-related needs will improve Outcome: Progressing   Problem: Clinical Measurements: Goal: Ability to maintain clinical measurements within normal limits will improve Outcome: Progressing   Problem: Coping: Goal: Level of anxiety will decrease Outcome: Progressing   Problem: Elimination: Goal: Will not experience complications related to bowel motility Outcome: Progressing   Problem: Pain Managment: Goal: General experience of comfort will improve Outcome: Progressing   Problem: Safety: Goal: Ability to remain free from injury will improve Outcome: Progressing   

## 2022-08-09 NOTE — Plan of Care (Signed)

## 2022-08-09 NOTE — Progress Notes (Signed)
Progress Note    Jim Blake  MVH:[id number] DOB: July 25, 1935  DOA: 08/03/2022 PCP: Dorothey Baseman, MD      Brief Narrative:    Medical records reviewed and are as summarized below:  Jim Blake is a 87 y.o. male with history significant for paroxysmal atrial fibrillation, anxiety, depression, hypertension, vitamin B12 deficiency who presents to the ER with chief complaint of altered mentation and decreased level mobility.      Assessment/Plan:   Principal Problem:   Generalized weakness Active Problems:   Essential hypertension   Altered mental status   Hypokalemia   Paroxysmal atrial fibrillation   Dementia with behavioral disturbance   Depression   Malnutrition of moderate degree   Nutrition Problem: Moderate Malnutrition Etiology: social / environmental circumstances  Signs/Symptoms: mild fat depletion, moderate fat depletion, mild muscle depletion, moderate muscle depletion   Body mass index is 24.85 kg/m.   General weakness, adult failure to thrive, change in mental status: Mental status suspected to be due to progression of dementia.  He is medically stable for discharge.  Awaiting placement to SNF.   Hypokalemia: Continue potassium supplement   Paroxysmal atrial fibrillation: Continue Eliquis   Hypertension, hypertensive urgency: Continue antihypertensives.     Dementia with behavioral disturbance: Continue donepezil and supportive care   Depression: Continue sertraline     Diet Order             Diet regular Room service appropriate? Yes; Fluid consistency: Thin  Diet effective now                            Consultants: Palliative care  Procedures: None    Medications:    amLODipine  5 mg Oral Daily   apixaban  5 mg Oral BID   donepezil  10 mg Oral QHS   feeding supplement  237 mL Oral TID BM   hydrALAZINE  25 mg Oral Q8H   lisinopril  20 mg Oral Daily   And   hydrochlorothiazide  12.5 mg Oral Daily    melatonin  2.5 mg Oral QHS   multivitamin with minerals  1 tablet Oral Daily   potassium chloride SA  20 mEq Oral Daily   sertraline  100 mg Oral Daily   Continuous Infusions:     Anti-infectives (From admission, onward)    None              Family Communication/Anticipated D/C date and plan/Code Status   DVT prophylaxis:  apixaban (ELIQUIS) tablet 5 mg     Code Status: Full Code  Family Communication: Plan discussed with Jim Blake (son) and Jim Blake (daughter) at the bedside Disposition Plan: Plan to discharge to SNF   Status is: Inpatient Remains inpatient appropriate because: Awaiting placement to SNF       Subjective:   Interval events noted.  He feels weak.  Jim Blake (son) and Jim Blake (daughter) at the bedside  Objective:    Vitals:   08/08/22 0817 08/08/22 1544 08/08/22 2155 08/09/22 0834  BP: (!) 161/90 (!) 167/65 (!) 144/77 (!) 157/71  Pulse: (!) 58 78 79 62  Resp: Temp: 97.7 F (36.5 C)  98.4 F (36.9 C) (!) 97.3 F (36.3 C)  TempSrc:      SpO2: 95% 96% 97% 96%  Weight:      Height:       No data found.   Intake/Output Summary (Last 24 hours) at  08/09/2022 1055 Last data filed at 08/09/2022 1051 Gross per 24 hour  Intake 240 ml  Output 900 ml  Net -660 ml    Filed Weights   08/03/22 1946 08/06/22 1003  Weight: 86.2 kg 83.1 kg    Exam:  GEN: NAD SKIN: No rash EYES: EOMI ENT: MMM CV: RRR PULM: CTA B ABD: soft, ND, NT, +BS CNS: Alert and oriented to person only, non focal EXT: No edema or tenderness    Data Reviewed:   I have personally reviewed following labs and imaging studies:  Labs: Labs show the following:   Basic Metabolic Panel: Recent Labs  Lab 08/03/22 1958 08/03/22 2230 08/04/22 0455 08/06/22 1018 08/08/22 0838  NA 139  --  139  --   --   K 3.2*  --  3.3* 3.6 3.3*  CL 102  --  103  --   --   CO2 28  --  29  --   --   GLUCOSE 110*  --  94  --   --   BUN 18  --  18  --   --   CREATININE  1.12  --  0.98  --   --   CALCIUM 8.5*  --  8.8*  --   --   MG  --  2.2  --   --   --    GFR Estimated Creatinine Clearance: 60.5 mL/min (by C-G formula based on SCr of 0.98 mg/dL). Liver Function Tests: No results for input(s): "AST", "ALT", "ALKPHOS", "BILITOT", "PROT", "ALBUMIN" in the last 168 hours. No results for input(s): "LIPASE", "AMYLASE" in the last 168 hours. No results for input(s): "AMMONIA" in the last 168 hours. Coagulation profile No results for input(s): "INR", "PROTIME" in the last 168 hours.  CBC: Recent Labs  Lab 08/03/22 1958 08/04/22 0455 08/09/22 0442  WBC 6.5 7.1 6.6  HGB 11.6* 12.0* 12.2*  HCT 35.7* 37.1* 37.4*  MCV 84.8 84.3 83.5  PLT 188 179 220   Cardiac Enzymes: No results for input(s): "CKTOTAL", "CKMB", "CKMBINDEX", "TROPONINI" in the last 168 hours. BNP (last 3 results) No results for input(s): "PROBNP" in the last 8760 hours. CBG: No results for input(s): "GLUCAP" in the last 168 hours. D-Dimer: No results for input(s): "DDIMER" in the last 72 hours. Hgb A1c: No results for input(s): "HGBA1C" in the last 72 hours. Lipid Profile: No results for input(s): "CHOL", "HDL", "LDLCALC", "TRIG", "CHOLHDL", "LDLDIRECT" in the last 72 hours. Thyroid function studies: No results for input(s): "TSH", "T4TOTAL", "T3FREE", "THYROIDAB" in the last 72 hours.  Invalid input(s): "FREET3"  Anemia work up: No results for input(s): "VITAMINB12", "FOLATE", "FERRITIN", "TIBC", "IRON", "RETICCTPCT" in the last 72 hours. Sepsis Labs: Recent Labs  Lab 08/03/22 1958 08/04/22 0455 08/09/22 0442  WBC 6.5 7.1 6.6    Microbiology Recent Results (from the past 240 hour(s))  Resp panel by RT-PCR (RSV, Flu A&B, Covid) Anterior Nasal Swab     Status: None   Collection Time: 08/03/22 11:05 PM   Specimen: Anterior Nasal Swab  Result Value Ref Range Status   SARS Coronavirus 2 by RT PCR NEGATIVE NEGATIVE Final    Comment: (NOTE) SARS-CoV-2 target nucleic acids  are NOT DETECTED.  The SARS-CoV-2 RNA is generally detectable in upper respiratory specimens during the acute phase of infection. The lowest concentration of SARS-CoV-2 viral copies this assay can detect is 138 copies/mL. A negative result does not preclude SARS-Cov-2 infection and should not be used as the sole basis  for treatment or other patient management decisions. A negative result may occur with  improper specimen collection/handling, submission of specimen other than nasopharyngeal swab, presence of viral mutation(s) within the areas targeted by this assay, and inadequate number of viral copies(<138 copies/mL). A negative result must be combined with clinical observations, patient history, and epidemiological information. The expected result is Negative.  Fact Sheet for Patients:  BloggerCourse.com  Fact Sheet for Healthcare Providers:  SeriousBroker.it  This test is no t yet approved or cleared by the [place] FDA and  has been authorized for detection and/or diagnosis of SARS-CoV-2 by FDA under an Emergency Use Authorization (EUA). This EUA will remain  in effect (meaning this test can be used) for the duration of the COVID-19 declaration under Section 564(b)(1) of the Act, 21 U.S.C.section 360bbb-3(b)(1), unless the authorization is terminated  or revoked sooner.       Influenza A by PCR NEGATIVE NEGATIVE Final   Influenza B by PCR NEGATIVE NEGATIVE Final    Comment: (NOTE) The Xpert Xpress SARS-CoV-2/FLU/RSV plus assay is intended as an aid in the diagnosis of influenza from Nasopharyngeal swab specimens and should not be used as a sole basis for treatment. Nasal washings and aspirates are unacceptable for Xpert Xpress SARS-CoV-2/FLU/RSV testing.  Fact Sheet for Patients: BloggerCourse.com  Fact Sheet for Healthcare Providers: SeriousBroker.it  This test is  not yet approved or cleared by the [place] FDA and has been authorized for detection and/or diagnosis of SARS-CoV-2 by FDA under an Emergency Use Authorization (EUA). This EUA will remain in effect (meaning this test can be used) for the duration of the COVID-19 declaration under Section 564(b)(1) of the Act, 21 U.S.C. section 360bbb-3(b)(1), unless the authorization is terminated or revoked.     Resp Syncytial Virus by PCR NEGATIVE NEGATIVE Final    Comment: (NOTE) Fact Sheet for Patients: BloggerCourse.com  Fact Sheet for Healthcare Providers: SeriousBroker.it  This test is not yet approved or cleared by the [place] FDA and has been authorized for detection and/or diagnosis of SARS-CoV-2 by FDA under an Emergency Use Authorization (EUA). This EUA will remain in effect (meaning this test can be used) for the duration of the COVID-19 declaration under Section 564(b)(1) of the Act, 21 U.S.C. section 360bbb-3(b)(1), unless the authorization is terminated or revoked.  Performed at [facility], [address] [place], [place] [address]     Procedures and diagnostic studies:  No results found.             LOS: 5 days   Latori Beggs  [facility]   Pager on [email or web address]. If 7PM-7AM, please contact night-coverage at [email or web address]     08/09/2022, 10:55 AM

## 2022-08-10 DIAGNOSIS — F03918 Unspecified dementia, unspecified severity, with other behavioral disturbance: Secondary | ICD-10-CM | POA: Diagnosis not present

## 2022-08-10 DIAGNOSIS — I10 Essential (primary) hypertension: Secondary | ICD-10-CM | POA: Diagnosis not present

## 2022-08-10 DIAGNOSIS — R531 Weakness: Secondary | ICD-10-CM | POA: Diagnosis not present

## 2022-08-10 NOTE — Progress Notes (Addendum)
Physical Therapy Treatment Patient Details Name: Jim Blake MRN: [id number] DOB: October 22, 1935 Today's Date: 08/10/2022   History of Present Illness 87 y.o. male with medical history significant for atrial fibrillation, anxiety and depression, hypertension and vitamin B12 deficiency, who presented to the emergency room with a [place] of the worsening generalized weakness as well as altered mental status with confusion and decreased mobility at home per his family.  No reported fever or chills.  No reported nausea or vomiting or abdominal pain.  No chest pain or palpitations.  No dyspnea or cough or wheezing.  No dysuria, oliguria or hematuria or flank pain.  No bleeding diathesis.  The patient had an offensive urine order though that his family was thinking as possible UTI.  He has been having worsening cognitive decline in the setting of dementia    PT Comments    Pt awake, ready for session.  To EOB with min a x 1 mostly tactile cues for direction "I don't know what you want."  Once sitting, noted inc urine not managed by external cath and small BM smear.  Stood at [employer] with mod a x 1 with full assist for care.  Transitions to recliner with mod a x 1 once dry.  Taken to hallway and is able to stand and walk 100' with RW and min a x 2 with daughter pushing recliner to allow for increased gait distances today.  Pt was limited to household distances at home and per daughter walk today was the furthest he has walked in a while.  Gait quality and speed did decrease as he fatigued and was limited by Clinical research associate for safety.  Pt seemed overall less confused, more engaged and able to follow directions better than when I worked with him last Tuesday.   Recommendations for follow up therapy are one component of a multi-disciplinary discharge planning process, led by the attending physician.  Recommendations may be updated based on patient status, additional functional criteria and insurance authorization.  Follow Up  Recommendations       Assistance Recommended at Discharge Frequent or constant Supervision/Assistance  Patient can return home with the following A lot of help with walking and/or transfers;A lot of help with bathing/dressing/bathroom;Assistance with cooking/housework;Assistance with feeding;Direct supervision/assist for medications management;Direct supervision/assist for financial management;Assist for transportation;Help with stairs or ramp for entrance   Equipment Recommendations       Recommendations for Other Services       Precautions / Restrictions Precautions Precautions: Fall Restrictions Weight Bearing Restrictions: No     Mobility  Bed Mobility Overal bed mobility: Needs Assistance Bed Mobility: Supine to Sit     Supine to sit: Min assist          Transfers Overall transfer level: Needs assistance Equipment used: Rolling walker (2 wheels) Transfers: Sit to/from Stand Sit to Stand: Mod assist                Ambulation/Gait Ambulation/Gait assistance: Min assist, +2 physical assistance Gait Distance (Feet): 100 Feet Assistive device: Rolling walker (2 wheels) Gait Pattern/deviations: Step-through pattern, Decreased step length - right, Decreased step length - left, Trunk flexed Gait velocity: decreased     General Gait Details: flexed posture and poor gait quality but was able to increase gait distances today with +2 assist and daughter pushing recliner   Stairs             Wheelchair Mobility    Modified Rankin (Stroke Patients Only)  Balance Overall balance assessment: Needs assistance Sitting-balance support: Feet supported Sitting balance-Leahy Scale: Fair     Standing balance support: Bilateral upper extremity supported Standing balance-Leahy Scale: Poor Standing balance comment: +2 for pt and staff safety is preferred.                            Cognition Arousal/Alertness: Awake/alert Behavior During  Therapy: Flat affect Overall Cognitive Status: Impaired/Different from baseline                                          Exercises Other Exercises Other Exercises: standing for care due to inc small BM    General Comments        Pertinent Vitals/Pain Pain Assessment Pain Assessment: No/denies pain    Home Living                          Prior Function            PT Goals (current goals can now be found in the care plan section) Progress towards PT goals: Progressing toward goals    Frequency    Min 2X/week      PT Plan Current plan remains appropriate    Co-evaluation              AM-PAC PT "6 Clicks" Mobility   Outcome Measure  Help needed turning from your back to your side while in a flat bed without using bedrails?: A Little Help needed moving from lying on your back to sitting on the side of a flat bed without using bedrails?: A Little Help needed moving to and from a bed to a chair (including a wheelchair)?: A Lot Help needed standing up from a chair using your arms (e.g., wheelchair or bedside chair)?: A Lot Help needed to walk in hospital room?: A Lot Help needed climbing 3-5 steps with a railing? : A Lot 6 Click Score: 14    End of Session Equipment Utilized During Treatment: Gait belt Activity Tolerance: Patient tolerated treatment well;Patient limited by fatigue Patient left: in chair;with call bell/phone within reach;with chair alarm set;with family/visitor present Nurse Communication: Mobility status PT Visit Diagnosis: Unsteadiness on feet (R26.81);Muscle weakness (generalized) (M62.81);Difficulty in walking, not elsewhere classified (R26.2)     Time: 4098-1191 PT Time Calculation (min) (ACUTE ONLY): 23 min  Charges:  $Gait Training: 8-22 mins $Therapeutic Activity: 8-22 mins                   Danielle Dess, PTA 08/10/22, 10:21 AM

## 2022-08-10 NOTE — Progress Notes (Signed)
Progress Note    Jim Blake  HYQ:[id number] DOB: Aug 02, 1935  DOA: 08/03/2022 PCP: Dorothey Baseman, MD      Brief Narrative:    Medical records reviewed and are as summarized below:  Jim Blake is a 87 y.o. male with history significant for paroxysmal atrial fibrillation, anxiety, depression, hypertension, vitamin B12 deficiency who presents to the ER with chief complaint of altered mentation and decreased level mobility.      Assessment/Plan:   Principal Problem:   Generalized weakness Active Problems:   Essential hypertension   Altered mental status   Hypokalemia   Paroxysmal atrial fibrillation   Dementia with behavioral disturbance   Depression   Malnutrition of moderate degree   Nutrition Problem: Moderate Malnutrition Etiology: social / environmental circumstances  Signs/Symptoms: mild fat depletion, moderate fat depletion, mild muscle depletion, moderate muscle depletion   Body mass index is 24.85 kg/m.   General weakness, adult failure to thrive, change in mental status: Mental status suspected to be due to progression of dementia.  He is medically stable for discharge.  Awaiting placement to SNF.   Hypokalemia: Continue potassium supplement   Paroxysmal atrial fibrillation: Continue Eliquis   Hypertension, hypertensive urgency: BP has improved.  Continue amlodipine, hydralazine, lisinopril and HCTZ   Dementia with behavioral disturbance: Continue donepezil and supportive care   Depression: Continue sertraline     Diet Order             Diet regular Room service appropriate? Yes; Fluid consistency: Thin  Diet effective now                            Consultants: Palliative care  Procedures: None    Medications:    amLODipine  5 mg Oral Daily   apixaban  5 mg Oral BID   donepezil  10 mg Oral QHS   feeding supplement  237 mL Oral TID BM   hydrALAZINE  25 mg Oral Q8H   lisinopril  20 mg Oral Daily   And    hydrochlorothiazide  12.5 mg Oral Daily   melatonin  2.5 mg Oral QHS   multivitamin with minerals  1 tablet Oral Daily   potassium chloride SA  20 mEq Oral Daily   sertraline  100 mg Oral Daily   Continuous Infusions:     Anti-infectives (From admission, onward)    None              Family Communication/Anticipated D/C date and plan/Code Status   DVT prophylaxis:  apixaban (ELIQUIS) tablet 5 mg     Code Status: Full Code  Family Communication: Plan discussed with his wife and Toniann Fail (daughter) at the bedside Disposition Plan: Plan to discharge to SNF   Status is: Inpatient Remains inpatient appropriate because: Awaiting placement to SNF       Subjective:   He is swollen and has no complaints.  His wife and daughter (Wednesday) were at the bedside.  Objective:    Vitals:   08/09/22 0834 08/09/22 1600 08/09/22 2329 08/10/22 0842  BP: (!) 157/71 (!) 168/80 (!) 154/73 (!) 142/88  Pulse: 62 61 63 62  Resp: Temp: (!) 97.3 F (36.3 C) 99.7 F (37.6 C) 98.4 F (36.9 C) 98.4 F (36.9 C)  TempSrc:    Axillary  SpO2: 96% 96% 96% 95%  Weight:      Height:       No data  found.   Intake/Output Summary (Last 24 hours) at 08/10/2022 1627 Last data filed at 08/10/2022 1342 Gross per 24 hour  Intake --  Output 900 ml  Net -900 ml    Filed Weights   08/03/22 1946 08/06/22 1003  Weight: 86.2 kg 83.1 kg    Exam:   GEN: NAD SKIN: No rash EYES: EOMI ENT: MMM CV: RRR PULM: CTA B ABD: soft, ND, NT, +BS CNS: Alert and oriented to person, non focal EXT: No edema or tenderness     Data Reviewed:   I have personally reviewed following labs and imaging studies:  Labs: Labs show the following:   Basic Metabolic Panel: Recent Labs  Lab 08/03/22 1958 08/03/22 2230 08/04/22 0455 08/06/22 1018 08/08/22 0838  NA 139  --  139  --   --   K 3.2*  --  3.3* 3.6 3.3*  CL 102  --  103  --   --   CO2 28  --  29  --   --   GLUCOSE 110*   --  94  --   --   BUN 18  --  18  --   --   CREATININE 1.12  --  0.98  --   --   CALCIUM 8.5*  --  8.8*  --   --   MG  --  2.2  --   --   --    GFR Estimated Creatinine Clearance: 60.5 mL/min (by C-G formula based on SCr of 0.98 mg/dL). Liver Function Tests: No results for input(s): "AST", "ALT", "ALKPHOS", "BILITOT", "PROT", "ALBUMIN" in the last 168 hours. No results for input(s): "LIPASE", "AMYLASE" in the last 168 hours. No results for input(s): "AMMONIA" in the last 168 hours. Coagulation profile No results for input(s): "INR", "PROTIME" in the last 168 hours.  CBC: Recent Labs  Lab 08/03/22 1958 08/04/22 0455 08/09/22 0442  WBC 6.5 7.1 6.6  HGB 11.6* 12.0* 12.2*  HCT 35.7* 37.1* 37.4*  MCV 84.8 84.3 83.5  PLT 188 179 220   Cardiac Enzymes: No results for input(s): "CKTOTAL", "CKMB", "CKMBINDEX", "TROPONINI" in the last 168 hours. BNP (last 3 results) No results for input(s): "PROBNP" in the last 8760 hours. CBG: No results for input(s): "GLUCAP" in the last 168 hours. D-Dimer: No results for input(s): "DDIMER" in the last 72 hours. Hgb A1c: No results for input(s): "HGBA1C" in the last 72 hours. Lipid Profile: No results for input(s): "CHOL", "HDL", "LDLCALC", "TRIG", "CHOLHDL", "LDLDIRECT" in the last 72 hours. Thyroid function studies: No results for input(s): "TSH", "T4TOTAL", "T3FREE", "THYROIDAB" in the last 72 hours.  Invalid input(s): "FREET3"  Anemia work up: No results for input(s): "VITAMINB12", "FOLATE", "FERRITIN", "TIBC", "IRON", "RETICCTPCT" in the last 72 hours. Sepsis Labs: Recent Labs  Lab 08/03/22 1958 08/04/22 0455 08/09/22 0442  WBC 6.5 7.1 6.6    Microbiology Recent Results (from the past 240 hour(s))  Resp panel by RT-PCR (RSV, Flu A&B, Covid) Anterior Nasal Swab     Status: None   Collection Time: 08/03/22 11:05 PM   Specimen: Anterior Nasal Swab  Result Value Ref Range Status   SARS Coronavirus 2 by RT PCR NEGATIVE NEGATIVE  Final    Comment: (NOTE) SARS-CoV-2 target nucleic acids are NOT DETECTED.  The SARS-CoV-2 RNA is generally detectable in upper respiratory specimens during the acute phase of infection. The lowest concentration of SARS-CoV-2 viral copies this assay can detect is 138 copies/mL. A negative result does not preclude SARS-Cov-2 infection  and should not be used as the sole basis for treatment or other patient management decisions. A negative result may occur with  improper specimen collection/handling, submission of specimen other than nasopharyngeal swab, presence of viral mutation(s) within the areas targeted by this assay, and inadequate number of viral copies(<138 copies/mL). A negative result must be combined with clinical observations, patient history, and epidemiological information. The expected result is Negative.  Fact Sheet for Patients:  BloggerCourse.com  Fact Sheet for Healthcare Providers:  SeriousBroker.it  This test is no t yet approved or cleared by the [place] FDA and  has been authorized for detection and/or diagnosis of SARS-CoV-2 by FDA under an Emergency Use Authorization (EUA). This EUA will remain  in effect (meaning this test can be used) for the duration of the COVID-19 declaration under Section 564(b)(1) of the Act, 21 U.S.C.section 360bbb-3(b)(1), unless the authorization is terminated  or revoked sooner.       Influenza A by PCR NEGATIVE NEGATIVE Final   Influenza B by PCR NEGATIVE NEGATIVE Final    Comment: (NOTE) The Xpert Xpress SARS-CoV-2/FLU/RSV plus assay is intended as an aid in the diagnosis of influenza from Nasopharyngeal swab specimens and should not be used as a sole basis for treatment. Nasal washings and aspirates are unacceptable for Xpert Xpress SARS-CoV-2/FLU/RSV testing.  Fact Sheet for Patients: BloggerCourse.com  Fact Sheet for Healthcare  Providers: SeriousBroker.it  This test is not yet approved or cleared by the [place] FDA and has been authorized for detection and/or diagnosis of SARS-CoV-2 by FDA under an Emergency Use Authorization (EUA). This EUA will remain in effect (meaning this test can be used) for the duration of the COVID-19 declaration under Section 564(b)(1) of the Act, 21 U.S.C. section 360bbb-3(b)(1), unless the authorization is terminated or revoked.     Resp Syncytial Virus by PCR NEGATIVE NEGATIVE Final    Comment: (NOTE) Fact Sheet for Patients: BloggerCourse.com  Fact Sheet for Healthcare Providers: SeriousBroker.it  This test is not yet approved or cleared by the [place] FDA and has been authorized for detection and/or diagnosis of SARS-CoV-2 by FDA under an Emergency Use Authorization (EUA). This EUA will remain in effect (meaning this test can be used) for the duration of the COVID-19 declaration under Section 564(b)(1) of the Act, 21 U.S.C. section 360bbb-3(b)(1), unless the authorization is terminated or revoked.  Performed at [facility], [address] [place], [place] [address]     Procedures and diagnostic studies:  No results found.             LOS: 6 days   Renn Dirocco  [facility]   Pager on [email or web address]. If 7PM-7AM, please contact night-coverage at [email or web address]     08/10/2022, 4:27 PM

## 2022-08-10 NOTE — Plan of Care (Signed)

## 2022-08-10 NOTE — TOC Transition Note (Signed)
Transition of Care [facility]) - CM/SW Discharge Note   Patient Details  Name: Jim Blake MRN: [id number] Date of Birth: 06-29-1935  Transition of Care [facility]) CM/SW Contact:  Susa Simmonds, LCSWA Phone Number: 08/10/2022, 11:42 AM   Clinical Narrative:  CSW spoke with patients son Jim Blake about picking a SNF facility. Jim Blake stated they are still hoping to get a facility in [place]. CSW stated she sent a referral to [facility] due to the patient no longer being in the ED. CSW also stated she sent another one to [facility]. CSW told patients son she cannot guarantee they can accept patient. Jim Blake is prepared to pick a facility outside of [place] if patient is not accepted.   CSW contacted Jim Blake with admissions at [facility] and Jim Blake with admissions at [facility] Patient will be reviewed.       Barriers to Discharge: Continued Medical Work up   Patient Goals and CMS Choice CMS Medicare.gov Compare Post Acute Care list provided to:: Patient Represenative (must comment) (son Jim Blake)    Discharge Placement                         Discharge Plan and Services Additional resources added to the After Visit Summary for                                       Social Determinants of Health (SDOH) Interventions SDOH Screenings   Food Insecurity: No Food Insecurity (08/04/2022)  Housing: Low Risk  (08/04/2022)  Transportation Needs: No Transportation Needs (08/04/2022)  Utilities: Not At Risk (08/04/2022)  Tobacco Use: High Risk (08/06/2022)     Readmission Risk Interventions     No data to display

## 2022-08-10 NOTE — Plan of Care (Signed)
  Problem: Health Behavior/Discharge Planning: Goal: Ability to manage health-related needs will improve Outcome: Progressing   Problem: Clinical Measurements: Goal: Ability to maintain clinical measurements within normal limits will improve Outcome: Progressing Goal: Will remain free from infection Outcome: Progressing   Problem: Activity: Goal: Risk for activity intolerance will decrease Outcome: Progressing   Problem: Nutrition: Goal: Adequate nutrition will be maintained Outcome: Progressing   Problem: Coping: Goal: Level of anxiety will decrease Outcome: Progressing   Problem: Safety: Goal: Ability to remain free from injury will improve Outcome: Progressing   

## 2022-08-11 DIAGNOSIS — R531 Weakness: Secondary | ICD-10-CM | POA: Diagnosis not present

## 2022-08-11 DIAGNOSIS — F03918 Unspecified dementia, unspecified severity, with other behavioral disturbance: Secondary | ICD-10-CM | POA: Diagnosis not present

## 2022-08-11 NOTE — Progress Notes (Signed)
Progress Note    Jim Blake  ZOX:[id number] DOB: 1935/11/03  DOA: 08/03/2022 PCP: Dorothey Baseman, MD      Brief Narrative:    Medical records reviewed and are as summarized below:  Navdeep Halt is a 87 y.o. male with history significant for paroxysmal atrial fibrillation, anxiety, depression, hypertension, vitamin B12 deficiency who presents to the ER with chief complaint of altered mentation and decreased level mobility.      Assessment/Plan:   Principal Problem:   Generalized weakness Active Problems:   Essential hypertension   Altered mental status   Hypokalemia   Paroxysmal atrial fibrillation   Dementia with behavioral disturbance   Depression   Malnutrition of moderate degree   Nutrition Problem: Moderate Malnutrition Etiology: social / environmental circumstances  Signs/Symptoms: mild fat depletion, moderate fat depletion, mild muscle depletion, moderate muscle depletion   Body mass index is 24.85 kg/m.   General weakness, adult failure to thrive, change in mental status: Mental status suspected to be due to progression of dementia.  He is medically stable for discharge.  Awaiting placement to SNF.   Hypokalemia: Continue potassium repletion and repeat potassium tomorrow   Paroxysmal atrial fibrillation: Continue Eliquis   Hypertension, hypertensive urgency: Continue antihypertensives   Dementia with behavioral disturbance: Continue donepezil and supportive care   Depression: Continue sertraline     Diet Order             Diet regular Room service appropriate? Yes; Fluid consistency: Thin  Diet effective now                            Consultants: Palliative care  Procedures: None    Medications:    amLODipine  5 mg Oral Daily   apixaban  5 mg Oral BID   donepezil  10 mg Oral QHS   feeding supplement  237 mL Oral TID BM   hydrALAZINE  25 mg Oral Q8H   lisinopril  20 mg Oral Daily   And   hydrochlorothiazide   12.5 mg Oral Daily   melatonin  2.5 mg Oral QHS   multivitamin with minerals  1 tablet Oral Daily   potassium chloride SA  20 mEq Oral Daily   sertraline  100 mg Oral Daily   Continuous Infusions:     Anti-infectives (From admission, onward)    None              Family Communication/Anticipated D/C date and plan/Code Status   DVT prophylaxis:  apixaban (ELIQUIS) tablet 5 mg     Code Status: Full Code  Family Communication: None Disposition Plan: Plan to discharge to SNF   Status is: Inpatient Remains inpatient appropriate because: Awaiting placement to SNF       Subjective:   Interval events noted.  He has no complaints.  No abdominal pain, chest pain or trouble breathing.  He is confused and cannot provide an adequate history.  Objective:    Vitals:   08/11/22 0830 08/11/22 0908 08/11/22 0930 08/11/22 1138  BP: (!) 150/100 (!) 182/87 117/61 134/61  Pulse: 68     Resp: 16     Temp: 98.7 F (37.1 C)     TempSrc: Oral     SpO2: 96%     Weight:      Height:       No data found.   Intake/Output Summary (Last 24 hours) at 08/11/2022 1316 Last data filed at 08/10/2022  1925 Gross per 24 hour  Intake 120 ml  Output 150 ml  Net -30 ml    Filed Weights   08/03/22 1946 08/06/22 1003  Weight: 86.2 kg 83.1 kg    Exam:   GEN: NAD SKIN: Warm and dry EYES: EOMI ENT: MMM CV: RRR PULM: CTA B ABD: soft, ND, NT, +BS CNS: AAO x 1 (person), non focal EXT: No edema or tenderness      Data Reviewed:   I have personally reviewed following labs and imaging studies:  Labs: Labs show the following:   Basic Metabolic Panel: Recent Labs  Lab 08/06/22 1018 08/08/22 0838  K 3.6 3.3*   GFR Estimated Creatinine Clearance: 60.5 mL/min (by C-G formula based on SCr of 0.98 mg/dL). Liver Function Tests: No results for input(s): "AST", "ALT", "ALKPHOS", "BILITOT", "PROT", "ALBUMIN" in the last 168 hours. No results for input(s): "LIPASE",  "AMYLASE" in the last 168 hours. No results for input(s): "AMMONIA" in the last 168 hours. Coagulation profile No results for input(s): "INR", "PROTIME" in the last 168 hours.  CBC: Recent Labs  Lab 08/09/22 0442  WBC 6.6  HGB 12.2*  HCT 37.4*  MCV 83.5  PLT 220   Cardiac Enzymes: No results for input(s): "CKTOTAL", "CKMB", "CKMBINDEX", "TROPONINI" in the last 168 hours. BNP (last 3 results) No results for input(s): "PROBNP" in the last 8760 hours. CBG: No results for input(s): "GLUCAP" in the last 168 hours. D-Dimer: No results for input(s): "DDIMER" in the last 72 hours. Hgb A1c: No results for input(s): "HGBA1C" in the last 72 hours. Lipid Profile: No results for input(s): "CHOL", "HDL", "LDLCALC", "TRIG", "CHOLHDL", "LDLDIRECT" in the last 72 hours. Thyroid function studies: No results for input(s): "TSH", "T4TOTAL", "T3FREE", "THYROIDAB" in the last 72 hours.  Invalid input(s): "FREET3"  Anemia work up: No results for input(s): "VITAMINB12", "FOLATE", "FERRITIN", "TIBC", "IRON", "RETICCTPCT" in the last 72 hours. Sepsis Labs: Recent Labs  Lab 08/09/22 0442  WBC 6.6    Microbiology Recent Results (from the past 240 hour(s))  Resp panel by RT-PCR (RSV, Flu A&B, Covid) Anterior Nasal Swab     Status: None   Collection Time: 08/03/22 11:05 PM   Specimen: Anterior Nasal Swab  Result Value Ref Range Status   SARS Coronavirus 2 by RT PCR NEGATIVE NEGATIVE Final    Comment: (NOTE) SARS-CoV-2 target nucleic acids are NOT DETECTED.  The SARS-CoV-2 RNA is generally detectable in upper respiratory specimens during the acute phase of infection. The lowest concentration of SARS-CoV-2 viral copies this assay can detect is 138 copies/mL. A negative result does not preclude SARS-Cov-2 infection and should not be used as the sole basis for treatment or other patient management decisions. A negative result may occur with  improper specimen collection/handling, submission of  specimen other than nasopharyngeal swab, presence of viral mutation(s) within the areas targeted by this assay, and inadequate number of viral copies(<138 copies/mL). A negative result must be combined with clinical observations, patient history, and epidemiological information. The expected result is Negative.  Fact Sheet for Patients:  BloggerCourse.com  Fact Sheet for Healthcare Providers:  SeriousBroker.it  This test is no t yet approved or cleared by the [place] FDA and  has been authorized for detection and/or diagnosis of SARS-CoV-2 by FDA under an Emergency Use Authorization (EUA). This EUA will remain  in effect (meaning this test can be used) for the duration of the COVID-19 declaration under Section 564(b)(1) of the Act, 21 U.S.C.section 360bbb-3(b)(1), unless the authorization  is terminated  or revoked sooner.       Influenza A by PCR NEGATIVE NEGATIVE Final   Influenza B by PCR NEGATIVE NEGATIVE Final    Comment: (NOTE) The Xpert Xpress SARS-CoV-2/FLU/RSV plus assay is intended as an aid in the diagnosis of influenza from Nasopharyngeal swab specimens and should not be used as a sole basis for treatment. Nasal washings and aspirates are unacceptable for Xpert Xpress SARS-CoV-2/FLU/RSV testing.  Fact Sheet for Patients: BloggerCourse.com  Fact Sheet for Healthcare Providers: SeriousBroker.it  This test is not yet approved or cleared by the [place] FDA and has been authorized for detection and/or diagnosis of SARS-CoV-2 by FDA under an Emergency Use Authorization (EUA). This EUA will remain in effect (meaning this test can be used) for the duration of the COVID-19 declaration under Section 564(b)(1) of the Act, 21 U.S.C. section 360bbb-3(b)(1), unless the authorization is terminated or revoked.     Resp Syncytial Virus by PCR NEGATIVE NEGATIVE Final     Comment: (NOTE) Fact Sheet for Patients: BloggerCourse.com  Fact Sheet for Healthcare Providers: SeriousBroker.it  This test is not yet approved or cleared by the [place] FDA and has been authorized for detection and/or diagnosis of SARS-CoV-2 by FDA under an Emergency Use Authorization (EUA). This EUA will remain in effect (meaning this test can be used) for the duration of the COVID-19 declaration under Section 564(b)(1) of the Act, 21 U.S.C. section 360bbb-3(b)(1), unless the authorization is terminated or revoked.  Performed at [facility], [address] [place], [place] [address]     Procedures and diagnostic studies:  No results found.             LOS: 7 days   Harrel Ferrone  [facility]   Pager on [email or web address]. If 7PM-7AM, please contact night-coverage at [email or web address]     08/11/2022, 1:16 PM

## 2022-08-11 NOTE — Plan of Care (Signed)

## 2022-08-11 NOTE — TOC Progression Note (Signed)
Transition of Care [facility]) - Progression Note    Patient Details  Name: Jim Blake MRN: [id number] Date of Birth: January 09, 1936  Transition of Care [facility]) CM/SW Contact  Garret Reddish, RN Phone Number: 08/11/2022, 3:42 PM  Clinical Narrative:    Chart reviewed.  Patient has received 2 bed offers today:  [facility] and [facility].  I have reviewed the new bed offers with patient's son Aurther Loft.  He has chosen [facility].  I have informed Sue Lush with [facility] and she informs me she will have a bed pending insurance authorization.    I have submitted [facility] SNF authorization.  SNF authorization pending at this time.  [facility] SNF pending authorization is [id number].    TOC will continue to follow for discharge planning.      Expected Discharge Plan: Skilled Nursing Facility Barriers to Discharge: Continued Medical Work up  Expected Discharge Plan and Services       Living arrangements for the past 2 months: Single Family Home                                       Social Determinants of Health (SDOH) Interventions SDOH Screenings   Food Insecurity: No Food Insecurity (08/04/2022)  Housing: Low Risk  (08/04/2022)  Transportation Needs: No Transportation Needs (08/04/2022)  Utilities: Not At Risk (08/04/2022)  Tobacco Use: High Risk (08/06/2022)    Readmission Risk Interventions     No data to display

## 2022-08-11 NOTE — Progress Notes (Signed)
Occupational Therapy Treatment Patient Details Name: Jim Blake MRN: [id number] DOB: 06-Jan-1936 Today's Date: 08/11/2022   History of present illness 87 y.o. male with medical history significant for atrial fibrillation, anxiety and depression, hypertension and vitamin B12 deficiency, who presented to the emergency room with a [place] of the worsening generalized weakness as well as altered mental status with confusion and decreased mobility at home per his family.  No reported fever or chills.  No reported nausea or vomiting or abdominal pain.  No chest pain or palpitations.  No dyspnea or cough or wheezing.  No dysuria, oliguria or hematuria or flank pain.  No bleeding diathesis.  The patient had an offensive urine order though that his family was thinking as possible UTI.  He has been having worsening cognitive decline in the setting of dementia   OT comments  Pt seen for OT tx and co-tx with PT to address ADL mobility. Pt seated on [facility] with nurse tech preparing for sponge bathing task upon arrival. Pt required MOD A for UB bathing, MAX A for LB bathing with VC for sequencing and initiation. Pt required MOD A for initiating threading pull up brief over feet and in standing (MOD A +2 to complete from [facility]) used both hands to complete donning over hips causing worsening standing balance requiring MIN A +2 from OT/PT to remain standing and limit any LOB. Pt negotiated room with RW versus bilat handheld assist with OT/PT with notable increased balance deficits this date as compared to previous sessions. Will continue to progress towards goals.     Recommendations for follow up therapy are one component of a multi-disciplinary discharge planning process, led by the attending physician.  Recommendations may be updated based on patient status, additional functional criteria and insurance authorization.    Assistance Recommended at Discharge Frequent or constant Supervision/Assistance  Patient can return  home with the following  A lot of help with bathing/dressing/bathroom;A lot of help with walking and/or transfers;Help with stairs or ramp for entrance;Assist for transportation;Assistance with cooking/housework   Equipment Recommendations  Other (comment) (defer)    Recommendations for Other Services      Precautions / Restrictions Precautions Precautions: Fall Restrictions Weight Bearing Restrictions: No       Mobility Bed Mobility               General bed mobility comments: pt on BSC, bed mobility not attempted    Transfers Overall transfer level: Needs assistance Equipment used: Rolling walker (2 wheels) Transfers: Sit to/from Stand Sit to Stand: Mod assist, +2 safety/equipment           General transfer comment: Pt required VC for sequencing and hand placement to improve safety, demo'd difficulty with motor planning and safety/deficits awareness.     Balance Overall balance assessment: Needs assistance Sitting-balance support: Feet supported Sitting balance-Leahy Scale: Fair     Standing balance support: Bilateral upper extremity supported Standing balance-Leahy Scale: Poor Standing balance comment: +2 for pt and staff safety is preferred.                           ADL either performed or assessed with clinical judgement   ADL Overall ADL's : Needs assistance/impaired         Upper Body Bathing: Sitting;Moderate assistance   Lower Body Bathing: Sitting/lateral leans;Maximal assistance   Upper Body Dressing : Sitting;Moderate assistance   Lower Body Dressing: Sitting/lateral leans;Moderate assistance Lower Body Dressing Details (indicate cue  type and reason): MOD A for donning pull on brief, requiring assist to initiate threading over feet after pt did not respond to verbal/visual initiation cues. Pt required CGA-MIN A +2 in standing to complete donning over hips as he removed both hands from the RW causing increased balance deficits  with decreased insight noted into deficits and need to correct.                    Extremity/Trunk Assessment              Vision       Perception     Praxis      Cognition Arousal/Alertness: Awake/alert Behavior During Therapy: WFL for tasks assessed/performed Overall Cognitive Status: History of cognitive impairments - at baseline                                          Exercises Other Exercises Other Exercises: Pt required MOD A for UB bathing, MIN A UB dressing, MOD A for LB dressing, and MAX A for LB bathing    Shoulder Instructions       General Comments      Pertinent Vitals/ Pain       Pain Assessment Pain Assessment: No/denies pain  Home Living                                          Prior Functioning/Environment              Frequency  Min 1X/week        Progress Toward Goals  OT Goals(current goals can now be found in the care plan section)  Progress towards OT goals: Progressing toward goals  Acute Rehab OT Goals Patient Stated Goal: go to rehab OT Goal Formulation: With patient/family Time For Goal Achievement: 08/18/22 Potential to Achieve Goals: Fair  Plan Discharge plan remains appropriate;Frequency remains appropriate    Co-evaluation    PT/OT/SLP Co-Evaluation/Treatment: Yes Reason for Co-Treatment: Necessary to address cognition/behavior during functional activity;For patient/therapist safety PT goals addressed during session: Mobility/safety with mobility OT goals addressed during session: ADL's and self-care      AM-PAC OT "6 Clicks" Daily Activity     Outcome Measure   Help from another person eating meals?: A Little Help from another person taking care of personal grooming?: A Little Help from another person toileting, which includes using toliet, bedpan, or urinal?: A Lot Help from another person bathing (including washing, rinsing, drying)?: A Lot Help from another  person to put on and taking off regular upper body clothing?: A Little Help from another person to put on and taking off regular lower body clothing?: A Lot 6 Click Score: 15    End of Session Equipment Utilized During Treatment: Gait belt;Rolling walker (2 wheels)  OT Visit Diagnosis: Unsteadiness on feet (R26.81);Repeated falls (R29.6);Muscle weakness (generalized) (M62.81);History of falling (Z91.81)   Activity Tolerance Patient tolerated treatment well   Patient Left in chair;with call bell/phone within reach;with chair alarm set;with nursing/sitter in room   Nurse Communication          Time: 8182-9937 OT Time Calculation (min): 8 min  Charges: OT General Charges $OT Visit: 1 Visit OT Treatments $Self Care/Home Management : 8-22 mins  Arman Filter., MPH, MS, OTR/L ascom  [phone] 08/11/22, 10:24 AM

## 2022-08-11 NOTE — Care Management Important Message (Signed)
Important Message  Patient Details  Name: Jim Blake MRN: [id number] Date of Birth: 1935/12/17   Medicare Important Message Given:  Yes     Olegario Messier A Refugio Vandevoorde 08/11/2022, 11:58 AM

## 2022-08-11 NOTE — Progress Notes (Addendum)
Physical Therapy Treatment Patient Details Name: Jim Blake MRN: [id number] DOB: Oct 19, 1935 Today's Date: 08/11/2022   History of Present Illness 87 y.o. male with medical history significant for atrial fibrillation, anxiety and depression, hypertension and vitamin B12 deficiency, who presented to the emergency room with a [place] of the worsening generalized weakness as well as altered mental status with confusion and decreased mobility at home per his family.  No reported fever or chills.  No reported nausea or vomiting or abdominal pain.  No chest pain or palpitations.  No dyspnea or cough or wheezing.  No dysuria, oliguria or hematuria or flank pain.  No bleeding diathesis.  The patient had an offensive urine order though that his family was thinking as possible UTI.  He has been having worsening cognitive decline in the setting of dementia    PT Comments    Pt in with nursing student upon arrival prepping for bath.  Assisted to EOB with mod a x 1.  Stood and transferred to [facility] at bedside with mod a x 2.  OT in and assisted tech with bathing and ADL's.  Stood to walker and struggles today to walk with walker and mod a x 2.  Trial of HHA +2 but no change and maybe slightly worse gait quality today.  Limited gait to door and back but remained in recliner after sessio with needs met and alarm in place.    Generally decreased gait quality today.  He did seem to have more knee flexion with gait today and appeared "bow legged" with gait which was not as pronounced yesterday. Denied pain and remained generally confused and pleasant.  Overlap with OT during session and 1 unit billed each per protocol.   Recommendations for follow up therapy are one component of a multi-disciplinary discharge planning process, led by the attending physician.  Recommendations may be updated based on patient status, additional functional criteria and insurance authorization.  Follow Up Recommendations        Assistance Recommended at Discharge    Patient can return home with the following A lot of help with walking and/or transfers;A lot of help with bathing/dressing/bathroom;Assistance with cooking/housework;Assistance with feeding;Direct supervision/assist for medications management;Direct supervision/assist for financial management;Assist for transportation;Help with stairs or ramp for entrance   Equipment Recommendations       Recommendations for Other Services       Precautions / Restrictions Precautions Precautions: Fall Restrictions Weight Bearing Restrictions: No     Mobility  Bed Mobility Overal bed mobility: Needs Assistance Bed Mobility: Supine to Sit     Supine to sit: Min assist       Patient Response: Cooperative  Transfers Overall transfer level: Needs assistance Equipment used: Rolling walker (2 wheels) Transfers: Sit to/from Stand Sit to Stand: Mod assist, +2 safety/equipment                Ambulation/Gait Ambulation/Gait assistance: +2 physical assistance, Mod assist Gait Distance (Feet): 20 Feet Assistive device: Rolling walker (2 wheels) Gait Pattern/deviations: Step-through pattern, Decreased step length - right, Decreased step length - left, Trunk flexed Gait velocity: decreased     General Gait Details: generally decreased gait quality today seeming to have difficulty today wiht walker, tried HHA but slight decrease in quality   Stairs             Wheelchair Mobility    Modified Rankin (Stroke Patients Only)       Balance Overall balance assessment: Needs assistance Sitting-balance support: Feet supported Sitting  balance-Leahy Scale: Fair Sitting balance - Comments: frequent verbal and tactile cues to remain upright                                    Cognition Arousal/Alertness: Awake/alert Behavior During Therapy: WFL for tasks assessed/performed Overall Cognitive Status: Impaired/Different from  baseline                                          Exercises Other Exercises Other Exercises: ADL's with OT co-tx    General Comments        Pertinent Vitals/Pain Pain Assessment Pain Assessment: No/denies pain    Home Living                          Prior Function            PT Goals (current goals can now be found in the care plan section) Progress towards PT goals: Progressing toward goals    Frequency    Min 2X/week      PT Plan Current plan remains appropriate    Co-evaluation PT/OT/SLP Co-Evaluation/Treatment: Yes Reason for Co-Treatment: Necessary to address cognition/behavior during functional activity;For patient/therapist safety PT goals addressed during session: Mobility/safety with mobility OT goals addressed during session: ADL's and self-care      AM-PAC PT "6 Clicks" Mobility   Outcome Measure  Help needed turning from your back to your side while in a flat bed without using bedrails?: A Little Help needed moving from lying on your back to sitting on the side of a flat bed without using bedrails?: A Little Help needed moving to and from a bed to a chair (including a wheelchair)?: A Lot Help needed standing up from a chair using your arms (e.g., wheelchair or bedside chair)?: A Lot Help needed to walk in hospital room?: A Lot Help needed climbing 3-5 steps with a railing? : A Lot 6 Click Score: 14    End of Session Equipment Utilized During Treatment: Gait belt Activity Tolerance: Patient tolerated treatment well Patient left: in chair;with call bell/phone within reach;with chair alarm set;with nursing/sitter in room Nurse Communication: Mobility status PT Visit Diagnosis: Unsteadiness on feet (R26.81);Muscle weakness (generalized) (M62.81);Difficulty in walking, not elsewhere classified (R26.2)     Time: 6122-4497 PT Time Calculation (min) (ACUTE ONLY): 12 min  Charges:  $Gait Training: 8-22 mins                    Danielle Dess, PTA 08/11/22, 9:23 AM

## 2022-08-12 DIAGNOSIS — R531 Weakness: Secondary | ICD-10-CM | POA: Diagnosis not present

## 2022-08-12 DIAGNOSIS — F03918 Unspecified dementia, unspecified severity, with other behavioral disturbance: Secondary | ICD-10-CM | POA: Diagnosis not present

## 2022-08-12 LAB — BASIC METABOLIC PANEL WITH GFR
Anion gap: 7 (ref 5–15)
BUN: 32 mg/dL — ABNORMAL HIGH (ref 8–23)
CO2: 30 mmol/L (ref 22–32)
Calcium: 9 mg/dL (ref 8.9–10.3)
Chloride: 103 mmol/L (ref 98–111)
Creatinine, Ser: 1.17 mg/dL (ref 0.61–1.24)
GFR, Estimated: >60 mL/min (ref >60)
Glucose, Bld: 99 mg/dL (ref 70–99)
Potassium: 3.8 mmol/L (ref 3.5–5.1)
Sodium: 140 mmol/L (ref 135–145)

## 2022-08-12 LAB — MAGNESIUM: Magnesium: 2.3 mg/dL (ref 1.7–2.4)

## 2022-08-12 MED ORDER — HYDRALAZINE HCL 25 MG PO TABS: 25.0000 mg | ORAL_TABLET | Freq: Three times a day (TID) | ORAL | Status: DC

## 2022-08-12 MED ORDER — AMLODIPINE BESYLATE 5 MG PO TABS: 5.0000 mg | ORAL_TABLET | Freq: Every day | ORAL | Status: DC

## 2022-08-12 NOTE — Progress Notes (Signed)
DISCHARGE  NOTE:   Pt discharged with belongings and IV removed. Discharge instruction given to pt's wife and family. No further questions or concerns. Pt transported home via EMS.

## 2022-08-12 NOTE — TOC Transition Note (Addendum)
Transition of Care [facility]) - CM/SW Discharge Note   Patient Details  Name: Jim Blake MRN: [id number] Date of Birth: October 12, 1935  Transition of Care [facility]) CM/SW Contact:  Garret Reddish, RN Phone Number: 08/12/2022, 12:21 PM   Clinical Narrative:    I have received  [facility] authorization for SNF.  Plan Auth [id number] Auth ID- [id number].  I have informed Jim Blake, Admissions Coordinator with [facility].  Jim Blake informs me she will have a bed for patient today at [facility].  Jim Blake reports that patient will be going to room 102 and the number to call report is [phone].   I have informed patient's son that patient would be a discharge today to [facility].  I have informed provider of the above information.  I have sent Jim Blake patient's Discharge summary, Discharge orders, and SNF transfer report via Epic hub.  I have arranged EMS with [facility].  I have informed staff nurse of the above information.      Final next level of care: Skilled Nursing Facility Barriers to Discharge: No Barriers Identified   Patient Goals and CMS Choice CMS Medicare.gov Compare Post Acute Care list provided to:: Patient Represenative (must comment) (son Jim Blake)    Discharge Placement                Patient chooses bed at: [facility] Patient to be transferred to facility by: [facility] Name of family member notified: Jim Blake  ( Patient's son) Patient and family notified of of transfer: 08/12/22  Discharge Plan and Services Additional resources added to the After Visit Summary for                                       Social Determinants of Health (SDOH) Interventions SDOH Screenings   Food Insecurity: No Food Insecurity (08/04/2022)  Housing: Low Risk  (08/04/2022)  Transportation Needs: No Transportation Needs (08/04/2022)  Utilities: Not At Risk (08/04/2022)  Tobacco Use: High Risk (08/06/2022)     Readmission Risk Interventions     No data to display

## 2022-08-12 NOTE — Discharge Summary (Signed)
Physician Discharge Summary   Patient: Jim Blake MRN: [id number] DOB: 1936/02/09  Admit date:     08/03/2022  Discharge date: 08/12/22  Discharge Physician: Lurene Shadow   PCP: Dorothey Baseman, MD   Recommendations at discharge:   Follow-up with physician at the nursing home within 3 days of discharge  Discharge Diagnoses: Principal Problem:   Generalized weakness Active Problems:   Essential hypertension   Altered mental status   Hypokalemia   Paroxysmal atrial fibrillation   Dementia with behavioral disturbance   Depression   Malnutrition of moderate degree  Resolved Problems:   * No resolved hospital problems. [place] Hospital Course:  Jim Blake is a 87 y.o. male with history significant for paroxysmal atrial fibrillation, anxiety, depression, hypertension, vitamin B12 deficiency who presented to the emergency department with chief complaint of altered mentation and decreased level mobility.  His blood pressure was 222/87 in the ED.   He was admitted to the hospital for generalized weakness, hypertensive urgency and altered mental status.  Altered mental status was attributed to progression of underlying dementia.   Assessment and Plan:   General weakness, adult failure to thrive, change in mental status: Mental status suspected to be due to progression of dementia.  He is medically stable for discharge.       Hypokalemia: Continue potassium supplement at discharge     Paroxysmal atrial fibrillation: Continue Eliquis     Hypertension, hypertensive urgency: Amlodipine and hydralazine were added for adequate BP control.  Continue lisinopril/HCTZ.     Dementia with behavioral disturbance: Continue donepezil and supportive care     Depression: Continue sertraline            Consultants: None Procedures performed: None  Disposition: Skilled nursing facility Diet recommendation:  Cardiac diet DISCHARGE MEDICATION: Allergies as of 08/12/2022   No Known  Allergies      Medication List     TAKE these medications    amLODipine 5 MG tablet Commonly known as: NORVASC Take 1 tablet (5 mg total) by mouth daily. Start taking on: August 13, 2022   apixaban 5 MG Tabs tablet Commonly known as: ELIQUIS Take 5 mg by mouth 2 (two) times daily.   donepezil 10 MG tablet Commonly known as: ARICEPT Take 1 tablet by mouth at bedtime.   hydrALAZINE 25 MG tablet Commonly known as: APRESOLINE Take 1 tablet (25 mg total) by mouth every 8 (eight) hours.   lisinopril-hydrochlorothiazide 20-12.5 MG tablet Commonly known as: ZESTORETIC Take 1 tablet by mouth 2 (two) times daily.   melatonin 3 MG Tabs tablet Take 3 mg by mouth at bedtime.   multivitamin with minerals tablet Take 1 tablet by mouth daily.   Potassium Chloride ER 20 MEQ Tbcr Take 1 tablet by mouth daily.   sertraline 100 MG tablet Commonly known as: ZOLOFT Take 100 mg by mouth daily.        Contact information for after-discharge care     Destination     [facility] .   Service: Skilled Nursing Contact information: [address] [place] [address] [phone]                    Discharge Exam: Ceasar Mons Weights   08/03/22 1946 08/06/22 1003  Weight: 86.2 kg 83.1 kg   GEN: NAD SKIN: Warm and dry EYES: EOMI ENT: MMM CV: RRR PULM: CTA B ABD: soft, ND, NT, +BS CNS: AAO x 1 (person only), non focal EXT:  No edema or tenderness   Condition at discharge: good  The results of significant diagnostics from this hospitalization (including imaging, microbiology, ancillary and laboratory) are listed below for reference.   Imaging Studies: CT Head Wo Contrast  Result Date: 08/03/2022 CLINICAL DATA:  Altered mental status.  Minor head trauma. EXAM: CT HEAD WITHOUT CONTRAST TECHNIQUE: Contiguous axial images were obtained from the base of the skull through the vertex without intravenous contrast. RADIATION DOSE REDUCTION:  This exam was performed according to the departmental dose-optimization program which includes automated exposure control, adjustment of the mA and/or kV according to patient size and/or use of iterative reconstruction technique. COMPARISON:  Head CT 10/10/2020 FINDINGS: Brain: Again noted are moderately advanced cerebral atrophy, small-vessel disease and atrophic ventriculomegaly, without midline shift or visible interval progression. Relatively mild cerebellar atrophy also unchanged. There are few bilateral tiny chronic gangliocapsular lacunar infarcts. No new asymmetry is seen concerning for an acute cortical based infarct, hemorrhage, mass or mass effect. Basal cisterns are clear. Vascular: There is calcification in the carotid siphons and distal vertebral arteries. No hyperdense central vessel is seen. Skull: Negative for fractures or focal lesions. Sinuses/Orbits: Remote right lens extraction. Otherwise negative orbits. Clear paranasal sinuses. Midline nasal septum. Other: Chronic wax impaction again noted filling the right external auditory canal. New finding of mild scattered fluid in the bilateral lower mastoid air cells. Both middle ears are clear. IMPRESSION: 1. No acute intracranial CT findings or depressed skull fractures. 2. Chronic changes. 3. New finding of mild scattered fluid in the bilateral lower mastoid air cells. 4. Chronic wax impaction filling the right external auditory canal. Electronically Signed   By: Almira Bar M.D.   On: 08/03/2022 23:02   DG Chest Port 1 View  Result Date: 08/03/2022 CLINICAL DATA:  Altered mental status, weakness, and malodorous urine. EXAM: PORTABLE CHEST 1 VIEW COMPARISON:  Portable chest 04/28/2022 FINDINGS: There is mild cardiomegaly. No vascular congestion is seen. There is mild aortic atherosclerosis with normal mediastinal configuration. The lungs are clear. There is no substantial pleural effusion. There is advanced thoracic spondylosis with bilateral  moderate shoulder DJD. IMPRESSION: No evidence of acute chest disease. Mild cardiomegaly. Aortic atherosclerosis. Stable chest. Electronically Signed   By: Almira Bar M.D.   On: 08/03/2022 22:53    Microbiology: Results for orders placed or performed during the hospital encounter of 08/03/22  Resp panel by RT-PCR (RSV, Flu A&B, Covid) Anterior Nasal Swab     Status: None   Collection Time: 08/03/22 11:05 PM   Specimen: Anterior Nasal Swab  Result Value Ref Range Status   SARS Coronavirus 2 by RT PCR NEGATIVE NEGATIVE Final    Comment: (NOTE) SARS-CoV-2 target nucleic acids are NOT DETECTED.  The SARS-CoV-2 RNA is generally detectable in upper respiratory specimens during the acute phase of infection. The lowest concentration of SARS-CoV-2 viral copies this assay can detect is 138 copies/mL. A negative result does not preclude SARS-Cov-2 infection and should not be used as the sole basis for treatment or other patient management decisions. A negative result may occur with  improper specimen collection/handling, submission of specimen other than nasopharyngeal swab, presence of viral mutation(s) within the areas targeted by this assay, and inadequate number of viral copies(<138 copies/mL). A negative result must be combined with clinical observations, patient history, and epidemiological information. The expected result is Negative.  Fact Sheet for Patients:  BloggerCourse.com  Fact Sheet for Healthcare Providers:  SeriousBroker.it  This test is no t yet approved or  cleared by the [place] and  has been authorized for detection and/or diagnosis of SARS-CoV-2 by FDA under an Emergency Use Authorization (EUA). This EUA will remain  in effect (meaning this test can be used) for the duration of the COVID-19 declaration under Section 564(b)(1) of the Act, 21 U.S.C.section 360bbb-3(b)(1), unless the authorization is terminated   or revoked sooner.       Influenza A by PCR NEGATIVE NEGATIVE Final   Influenza B by PCR NEGATIVE NEGATIVE Final    Comment: (NOTE) The Xpert Xpress SARS-CoV-2/FLU/RSV plus assay is intended as an aid in the diagnosis of influenza from Nasopharyngeal swab specimens and should not be used as a sole basis for treatment. Nasal washings and aspirates are unacceptable for Xpert Xpress SARS-CoV-2/FLU/RSV testing.  Fact Sheet for Patients: BloggerCourse.com  Fact Sheet for Healthcare Providers: SeriousBroker.it  This test is not yet approved or cleared by the [place] FDA and has been authorized for detection and/or diagnosis of SARS-CoV-2 by FDA under an Emergency Use Authorization (EUA). This EUA will remain in effect (meaning this test can be used) for the duration of the COVID-19 declaration under Section 564(b)(1) of the Act, 21 U.S.C. section 360bbb-3(b)(1), unless the authorization is terminated or revoked.     Resp Syncytial Virus by PCR NEGATIVE NEGATIVE Final    Comment: (NOTE) Fact Sheet for Patients: BloggerCourse.com  Fact Sheet for Healthcare Providers: SeriousBroker.it  This test is not yet approved or cleared by the [place] FDA and has been authorized for detection and/or diagnosis of SARS-CoV-2 by FDA under an Emergency Use Authorization (EUA). This EUA will remain in effect (meaning this test can be used) for the duration of the COVID-19 declaration under Section 564(b)(1) of the Act, 21 U.S.C. section 360bbb-3(b)(1), unless the authorization is terminated or revoked.  Performed at [facility], [address] [place], [place] [address]     Labs: CBC: Recent Labs  Lab 08/09/22 0442  WBC 6.6  HGB 12.2*  HCT 37.4*  MCV 83.5  PLT 220   Basic Metabolic Panel: Recent Labs  Lab 08/06/22 1018 08/08/22 0838 08/12/22 0631  NA  --    --  140  K 3.6 3.3* 3.8  CL  --   --  103  CO2  --   --  30  GLUCOSE  --   --  99  BUN  --   --  32*  CREATININE  --   --  1.17  CALCIUM  --   --  9.0  MG  --   --  2.3   Liver Function Tests: No results for input(s): "AST", "ALT", "ALKPHOS", "BILITOT", "PROT", "ALBUMIN" in the last 168 hours. CBG: No results for input(s): "GLUCAP" in the last 168 hours.  Discharge time spent: greater than 30 minutes.  Signed: Lurene Shadow, MD [facility] 08/12/2022

## 2022-08-13 ENCOUNTER — Non-Acute Institutional Stay (SKILLED_NURSING_FACILITY): Payer: Medicare Other | Admitting: Student

## 2022-08-13 ENCOUNTER — Encounter: Payer: Self-pay | Admitting: Student

## 2022-08-13 DIAGNOSIS — F03918 Unspecified dementia, unspecified severity, with other behavioral disturbance: Secondary | ICD-10-CM

## 2022-08-13 DIAGNOSIS — E876 Hypokalemia: Secondary | ICD-10-CM

## 2022-08-13 DIAGNOSIS — E440 Moderate protein-calorie malnutrition: Secondary | ICD-10-CM | POA: Diagnosis not present

## 2022-08-13 DIAGNOSIS — R531 Weakness: Secondary | ICD-10-CM

## 2022-08-13 DIAGNOSIS — I482 Chronic atrial fibrillation, unspecified: Secondary | ICD-10-CM

## 2022-08-13 DIAGNOSIS — I10 Essential (primary) hypertension: Secondary | ICD-10-CM

## 2022-08-13 NOTE — Progress Notes (Signed)
Provider:  Dr. Earnestine Mealing Location:  Other [facility].  Nursing Home Room Number: [facility] 102A Place of Service:  SNF (31)  PCP: Dorothey Baseman, MD Patient Care Team: Dorothey Baseman, MD as PCP - General [facility] Medicine)  Extended Emergency Contact Information Primary Emergency Contact: Merry Proud Address: [address]          [place], [place] [address] Home Phone: [phone] Mobile Phone: [phone] Relation: Spouse Secondary Emergency Contact: Tabone,[name] Mobile Phone: [phone] Relation: Son  Code Status: Full Code Goals of Care: Advanced Directive information    08/13/2022    8:23 AM  Advanced Directives  Does Patient Have a Medical Advance Directive? No  Would patient like information on creating a medical advance directive? No - Patient declined      Chief Complaint  Patient presents with   New Admit To SNF    New Admission    HPI: Patient is a 87 y.o. male seen today for admission to [facility] for rehab.   Patient's baseline is oriented to self only. CT head negative for acute changes during hospitalization.   Decreased mobility at home due to failure to thrive.   Patient gives his name, however, when asked about his wife, he doesn't recognize the name. Unable to get in touch with family at this time.  Past Medical History:  Diagnosis Date   Atrial fibrillation    persistent   on NOAC   Hypertension    Vitamin B 12 deficiency    History reviewed. No pertinent surgical history.  reports that he has been smoking cigars. He has never used smokeless tobacco. He reports that he does not drink alcohol and does not use drugs. Social History   Socioeconomic History   Marital status: Married    Spouse name: Not on file   Number of children: Not on file   Years of education: Not on file   Highest education level: Not on file  Occupational History   Not on file  Tobacco Use   Smoking status: Some Days    Types: Cigars    Smokeless tobacco: Never  Substance and Sexual Activity   Alcohol use: Never   Drug use: Never   Sexual activity: Not Currently  Other Topics Concern   Not on file  Social History Narrative   Not on file   Social Determinants of Health   Financial Resource Strain: Not on file  Food Insecurity: No Food Insecurity (08/04/2022)   Hunger Vital Sign    Worried About Running Out of Food in the Last Year: Never true    Ran Out of Food in the Last Year: Never true  Transportation Needs: No Transportation Needs (08/04/2022)   PRAPARE - Administrator, Civil Service (Medical): No    Lack of Transportation (Non-Medical): No  Physical Activity: Not on file  Stress: Not on file  Social Connections: Not on file  Intimate Partner Violence: Not At Risk (08/04/2022)   Humiliation, Afraid, Rape, and Kick questionnaire    Fear of Current or Ex-Partner: No    Emotionally Abused: No    Physically Abused: No    Sexually Abused: No    Functional Status Survey:    History reviewed. No pertinent family history.  Health Maintenance  Topic Date Due   Medicare Annual Wellness (AWV)  Never done   Pneumonia Vaccine 2+ Years old (1 of 2 - PCV) Never done   Zoster Vaccines- Shingrix (1 of 2) Never  done   COVID-19 Vaccine (3 - 2023-24 season) 12/27/2021   INFLUENZA VACCINE  11/27/2022   DTaP/Tdap/Td (5 - Td or Tdap) 10/27/2027   HPV VACCINES  Aged Out    No Known Allergies  Outpatient Encounter Medications as of 08/13/2022  Medication Sig   amLODipine (NORVASC) 5 MG tablet Take 1 tablet (5 mg total) by mouth daily.   apixaban (ELIQUIS) 5 MG TABS tablet Take 5 mg by mouth 2 (two) times daily.   donepezil (ARICEPT) 10 MG tablet Take 1 tablet by mouth at bedtime.   hydrALAZINE (APRESOLINE) 25 MG tablet Take 1 tablet (25 mg total) by mouth every 8 (eight) hours.   lisinopril-hydrochlorothiazide (ZESTORETIC) 20-12.5 MG tablet Take 1 tablet by mouth 2 (two) times daily.   melatonin 3 MG TABS  tablet Take 3 mg by mouth at bedtime.   Multiple Vitamins-Minerals (MULTIVITAMIN WITH MINERALS) tablet Take 1 tablet by mouth daily.   Potassium Chloride ER 20 MEQ TBCR Take 1 tablet by mouth daily.   sertraline (ZOLOFT) 100 MG tablet Take 100 mg by mouth daily.   No facility-administered encounter medications on file as of 08/13/2022.    Review of Systems  Vitals:   08/13/22 0817  BP: 137/70  Pulse: 64  Resp: 18  Temp: 97.7 F (36.5 C)  SpO2: 94%  Weight: 182 lb (82.6 kg)  Height: 6' (1.829 m)   Body mass index is 24.68 kg/m. Physical Exam HENT:     Mouth/Throat:     Comments: Poor dentition Cardiovascular:     Rate and Rhythm: Normal rate and regular rhythm.  Pulmonary:     Effort: Pulmonary effort is normal.     Breath sounds: Normal breath sounds.  Neurological:     Mental Status: He is alert. Mental status is at baseline. He is disoriented.     Labs reviewed: Basic Metabolic Panel: Recent Labs    05/05/22 0515 08/03/22 1958 08/03/22 2230 08/04/22 0455 08/06/22 1018 08/08/22 0838 08/12/22 0631  NA 138 139  --  139  --   --  140  K 3.5 3.2*  --  3.3* 3.6 3.3* 3.8  CL 104 102  --  103  --   --  103  CO2 27 28  --  29  --   --  30  GLUCOSE 95 110*  --  94  --   --  99  BUN 25* 18  --  18  --   --  32*  CREATININE 1.34* 1.12  --  0.98  --   --  1.17  CALCIUM 8.6* 8.5*  --  8.8*  --   --  9.0  MG 1.9  --  2.2  --   --   --  2.3   Liver Function Tests: Recent Labs    04/28/22 1951  AST 23  ALT 15  ALKPHOS 73  BILITOT 0.7  PROT 7.0  ALBUMIN 3.3*   No results for input(s): "LIPASE", "AMYLASE" in the last 8760 hours. No results for input(s): "AMMONIA" in the last 8760 hours. CBC: Recent Labs    04/28/22 1951 04/30/22 0557 08/03/22 1958 08/04/22 0455 08/09/22 0442  WBC 8.0   < > 6.5 7.1 6.6  NEUTROABS 5.7  --   --   --   --   HGB 13.1   < > 11.6* 12.0* 12.2*  HCT 40.7   < > 35.7* 37.1* 37.4*  MCV 83.7   < > 84.8 84.3 83.5  PLT  237   < > 188  179 220   < > = values in this interval not displayed.   Cardiac Enzymes: No results for input(s): "CKTOTAL", "CKMB", "CKMBINDEX", "TROPONINI" in the last 8760 hours. BNP: Invalid input(s): "POCBNP" No results found for: "HGBA1C" Lab Results  Component Value Date   TSH 1.511 08/04/2022   No results found for: "VITAMINB12" No results found for: "FOLATE" No results found for: "IRON", "TIBC", "FERRITIN"  Imaging and Procedures obtained prior to SNF admission: CT Head Wo Contrast  Result Date: 08/03/2022 CLINICAL DATA:  Altered mental status.  Minor head trauma. EXAM: CT HEAD WITHOUT CONTRAST TECHNIQUE: Contiguous axial images were obtained from the base of the skull through the vertex without intravenous contrast. RADIATION DOSE REDUCTION: This exam was performed according to the departmental dose-optimization program which includes automated exposure control, adjustment of the mA and/or kV according to patient size and/or use of iterative reconstruction technique. COMPARISON:  Head CT 10/10/2020 FINDINGS: Brain: Again noted are moderately advanced cerebral atrophy, small-vessel disease and atrophic ventriculomegaly, without midline shift or visible interval progression. Relatively mild cerebellar atrophy also unchanged. There are few bilateral tiny chronic gangliocapsular lacunar infarcts. No new asymmetry is seen concerning for an acute cortical based infarct, hemorrhage, mass or mass effect. Basal cisterns are clear. Vascular: There is calcification in the carotid siphons and distal vertebral arteries. No hyperdense central vessel is seen. Skull: Negative for fractures or focal lesions. Sinuses/Orbits: Remote right lens extraction. Otherwise negative orbits. Clear paranasal sinuses. Midline nasal septum. Other: Chronic wax impaction again noted filling the right external auditory canal. New finding of mild scattered fluid in the bilateral lower mastoid air cells. Both middle ears are clear.  IMPRESSION: 1. No acute intracranial CT findings or depressed skull fractures. 2. Chronic changes. 3. New finding of mild scattered fluid in the bilateral lower mastoid air cells. 4. Chronic wax impaction filling the right external auditory canal. Electronically Signed   By: Almira Bar M.D.   On: 08/03/2022 23:02   DG Chest Port 1 View  Result Date: 08/03/2022 CLINICAL DATA:  Altered mental status, weakness, and malodorous urine. EXAM: PORTABLE CHEST 1 VIEW COMPARISON:  Portable chest 04/28/2022 FINDINGS: There is mild cardiomegaly. No vascular congestion is seen. There is mild aortic atherosclerosis with normal mediastinal configuration. The lungs are clear. There is no substantial pleural effusion. There is advanced thoracic spondylosis with bilateral moderate shoulder DJD. IMPRESSION: No evidence of acute chest disease. Mild cardiomegaly. Aortic atherosclerosis. Stable chest. Electronically Signed   By: Almira Bar M.D.   On: 08/03/2022 22:53    Assessment/Plan Dementia with behavioral disturbance  Generalized weakness  Hypokalemia  Malnutrition of moderate degree  Atrial fibrillation, chronic  Essential hypertension Patient admitted to rehab after hospitalization for encephalopathy with underlying dementia. No acute infectious process noted. Thought to be due to patient's overall decline. Based on patient's level of disorientation, some concerns these findings are more reflective of his decline. Repeat BMP in 1 week. Continue home medications with potassium supplementation. Continue eliquis. Continue lisinopril, hydralazine, HCTZ and amlodipine. Continue daily blood pressure checks to determine need for decreasing regimen. Continue donepezil and supportive care. Continue regular, NAS diet.   Family/ staff Communication: Nursing, unable to discuss with family  Labs/tests ordered:BMP1 wk

## 2022-09-04 ENCOUNTER — Encounter: Payer: Self-pay | Admitting: Nurse Practitioner

## 2022-09-04 NOTE — Progress Notes (Signed)
ERROR

## 2022-09-04 NOTE — Progress Notes (Deleted)
Subjective:   Jim Blake is a 87 y.o. male who presents for Medicare Annual/Subsequent preventive examination.  Review of Systems           Objective:    There were no vitals filed for this visit. There is no height or weight on file to calculate BMI.     08/13/2022    8:23 AM 08/04/2022    7:00 AM 04/28/2022    8:18 PM 10/10/2020    9:55 AM 10/06/2020   11:39 AM  Advanced Directives  Does Patient Have a Medical Advance Directive? No No No No No  Would patient like information on creating a medical advance directive? No - Patient declined No - Patient declined No - Patient declined No - Patient declined     Current Medications (verified) Outpatient Encounter Medications as of 09/04/2022  Medication Sig   amLODipine (NORVASC) 5 MG tablet Take 1 tablet (5 mg total) by mouth daily.   apixaban (ELIQUIS) 5 MG TABS tablet Take 5 mg by mouth 2 (two) times daily.   donepezil (ARICEPT) 10 MG tablet Take 1 tablet by mouth at bedtime.   hydrALAZINE (APRESOLINE) 25 MG tablet Take 1 tablet (25 mg total) by mouth every 8 (eight) hours.   lisinopril-hydrochlorothiazide (ZESTORETIC) 20-12.5 MG tablet Take 1 tablet by mouth 2 (two) times daily.   melatonin 3 MG TABS tablet Take 3 mg by mouth at bedtime.   Multiple Vitamins-Minerals (MULTIVITAMIN WITH MINERALS) tablet Take 1 tablet by mouth daily.   Potassium Chloride ER 20 MEQ TBCR Take 1 tablet by mouth daily.   sertraline (ZOLOFT) 100 MG tablet Take 100 mg by mouth daily.   No facility-administered encounter medications on file as of 09/04/2022.    Allergies (verified) Patient has no known allergies.   History: Past Medical History:  Diagnosis Date   Atrial fibrillation (HCC)    persistent   on NOAC   Hypertension    Vitamin B 12 deficiency    No past surgical history on file. No family history on file. Social History   Socioeconomic History   Marital status: Married    Spouse name: Not on file   Number of children: Not on file    Years of education: Not on file   Highest education level: Not on file  Occupational History   Not on file  Tobacco Use   Smoking status: Some Days    Types: Cigars   Smokeless tobacco: Never  Substance and Sexual Activity   Alcohol use: Never   Drug use: Never   Sexual activity: Not Currently  Other Topics Concern   Not on file  Social History Narrative   Not on file   Social Determinants of Health   Financial Resource Strain: Not on file  Food Insecurity: No Food Insecurity (08/04/2022)   Hunger Vital Sign    Worried About Running Out of Food in the Last Year: Never true    Ran Out of Food in the Last Year: Never true  Transportation Needs: No Transportation Needs (08/04/2022)   PRAPARE - Administrator, Civil Service (Medical): No    Lack of Transportation (Non-Medical): No  Physical Activity: Not on file  Stress: Not on file  Social Connections: Not on file    Tobacco Counseling Ready to quit: Not Answered Counseling given: Not Answered   Clinical Intake:                 Diabetic?no  Activities of Daily Living    08/04/2022    7:00 AM 04/30/2022   11:00 AM  In your present state of health, do you have any difficulty performing the following activities:  Hearing? 0 0  Vision? 0 0  Difficulty concentrating or making decisions? 1 0  Walking or climbing stairs? 1 1  Dressing or bathing? 1 0  Doing errands, shopping? 0 0    Patient Care Team: Dorothey Baseman, MD as PCP - General (Family Medicine)  Indicate any recent Medical Services you may have received from other than [facility] providers in the past year (date may be approximate).     Assessment:   This is a routine wellness examination for Jim Blake.  Hearing/Vision screen No results found.  Dietary issues and exercise activities discussed:     Goals Addressed   None    Depression Screen     No data to display          Fall Risk     No data to display           FALL RISK PREVENTION PERTAINING TO THE HOME:  Any stairs in or around the home? No  If so, are there any without handrails? na Home free of loose throw rugs in walkways, pet beds, electrical cords, etc? Yes  Adequate lighting in your home to reduce risk of falls? Yes   ASSISTIVE DEVICES UTILIZED TO PREVENT FALLS:  Life alert? No  Use of a cane, walker or w/c? Yes  Grab bars in the bathroom? Yes  Shower chair or bench in shower? Yes  Elevated toilet seat or a handicapped toilet? Yes   TIMED UP AND GO:  Was the test performed? No .    Cognitive Function:        Immunizations Immunization History  Administered Date(s) Administered   Influenza Inj Mdck Quad Pf 01/14/2019   Influenza,inj,Quad PF,6+ Mos 03/21/2020   Influenza-Unspecified 01/18/2016, 01/16/2017, 01/29/2018   Moderna Sars-Covid-2 Vaccination 12/01/2019, 12/29/2019   Td 10/26/2017   Td (Adult),5 Lf Tetanus Toxid, Preservative Free 10/26/2017   Td (Adult),unspecified 05/01/2008   Tdap 05/01/2008   Zoster, Live 04/28/2009    TDAP status: Up to date  Flu Vaccine status: Completed at today's visit  {Pneumococcal vaccine status:2101807}  {Covid-19 vaccine status:2101808}  Qualifies for Shingles Vaccine? {YES/NO:21197}  Zostavax completed {YES/NO:21197}  {Shingrix Completed?:2101804}  Screening Tests Health Maintenance  Topic Date Due   Medicare Annual Wellness (AWV)  Never done   Pneumonia Vaccine 78+ Years old (1 of 2 - PCV) Never done   Zoster Vaccines- Shingrix (1 of 2) Never done   COVID-19 Vaccine (3 - 2023-24 season) 12/27/2021   INFLUENZA VACCINE  11/27/2022   DTaP/Tdap/Td (5 - Td or Tdap) 10/27/2027   HPV VACCINES  Aged Out    Health Maintenance  Health Maintenance Due  Topic Date Due   Medicare Annual Wellness (AWV)  Never done   Pneumonia Vaccine 8+ Years old (1 of 2 - PCV) Never done   Zoster Vaccines- Shingrix (1 of 2) Never done   COVID-19 Vaccine (3 - 2023-24 season)  12/27/2021    {Colorectal cancer screening:2101809}  Lung Cancer Screening: (Low Dose CT Chest recommended if Age 76-80 years, 30 pack-year currently smoking OR have quit w/in 15years.) {DOES NOT does:27190::"does not"} qualify.   Lung Cancer Screening Referral: ***  Additional Screening:  Hepatitis C Screening: {DOES NOT does:27190::"does not"} qualify; Completed ***  Vision Screening: Recommended annual ophthalmology exams for early detection of  glaucoma and other disorders of the eye. Is the patient up to date with their annual eye exam?  {YES/NO:21197} Who is the provider or what is the name of the office in which the patient attends annual eye exams? *** If pt is not established with a provider, would they like to be referred to a provider to establish care? {YES/NO:21197}.   Dental Screening: Recommended annual dental exams for proper oral hygiene  Community Resource Referral / Chronic Care Management: CRR required this visit?  {YES/NO:21197}  CCM required this visit?  {YES/NO:21197}     Plan:     I have personally reviewed and noted the following in the patient's chart:   Medical and social history Use of alcohol, tobacco or illicit drugs  Current medications and supplements including opioid prescriptions. {Opioid [phone]} Functional ability and status Nutritional status Physical activity Advanced directives List of other physicians Hospitalizations, surgeries, and ER visits in previous 12 months Vitals Screenings to include cognitive, depression, and falls Referrals and appointments  In addition, I have reviewed and discussed with patient certain preventive protocols, quality metrics, and best practice recommendations. A written personalized care plan for preventive services as well as general preventive health recommendations were provided to patient.     Sharon Seller, NP   09/04/2022   Nurse Notes: ***

## 2022-09-12 ENCOUNTER — Non-Acute Institutional Stay (SKILLED_NURSING_FACILITY): Payer: Medicare Other | Admitting: Student

## 2022-09-12 ENCOUNTER — Encounter: Payer: Self-pay | Admitting: Student

## 2022-09-12 DIAGNOSIS — M436 Torticollis: Secondary | ICD-10-CM | POA: Diagnosis not present

## 2022-09-12 DIAGNOSIS — I10 Essential (primary) hypertension: Secondary | ICD-10-CM | POA: Diagnosis not present

## 2022-09-12 DIAGNOSIS — E440 Moderate protein-calorie malnutrition: Secondary | ICD-10-CM

## 2022-09-12 DIAGNOSIS — F03918 Unspecified dementia, unspecified severity, with other behavioral disturbance: Secondary | ICD-10-CM

## 2022-09-12 DIAGNOSIS — I482 Chronic atrial fibrillation, unspecified: Secondary | ICD-10-CM

## 2022-09-12 DIAGNOSIS — N1831 Chronic kidney disease, stage 3a: Secondary | ICD-10-CM

## 2022-09-12 DIAGNOSIS — R296 Repeated falls: Secondary | ICD-10-CM

## 2022-09-12 NOTE — Progress Notes (Unsigned)
Location:  Other [facility].  Nursing Home Room Number: [facility] 414A Place of Service:  SNF [phone]) Provider:  Dr. Earnestine Mealing  PCP: Dorothey Baseman, MD  Patient Care Team: Dorothey Baseman, MD as PCP - General [facility] Medicine)  Extended Emergency Contact Information Primary Emergency Contact: Merry Proud Address: [address]          [place], [place] [address] Home Phone: [phone] Mobile Phone: [phone] Relation: Spouse Secondary Emergency Contact: Tremaine,[name] Mobile Phone: [phone] Relation: Son  Code Status:  Full Code.  Goals of care: Advanced Directive information    09/12/2022    9:40 AM  Advanced Directives  Does Patient Have a Medical Advance Directive? No  Would patient like information on creating a medical advance directive? No - Patient declined     Chief Complaint  Patient presents with   Medical Management of Chronic Issues    Medial Management of Chronic Issues.     HPI:  Pt is a 87 y.o. male seen today for medical management of chronic diseases.    Patient is at breakfast and asks if I can get him some more food. He has a stuffed cat that he holds and attempts to feed his food.  He is pleasant, but has difficulty answering questions outside of his name.   He is here for long term care.   Nursing without concern. He is becoming accustomed to to the location.   Past Medical History:  Diagnosis Date   Atrial fibrillation (HCC)    persistent   on NOAC   Hypertension    Vitamin B 12 deficiency    History reviewed. No pertinent surgical history.  No Known Allergies  Outpatient Encounter Medications as of 09/12/2022  Medication Sig   amLODipine (NORVASC) 5 MG tablet Take 1 tablet (5 mg total) by mouth daily.   apixaban (ELIQUIS) 5 MG TABS tablet Take 5 mg by mouth 2 (two) times daily.   donepezil (ARICEPT) 10 MG tablet Take 1 tablet by mouth at bedtime.   lisinopril-hydrochlorothiazide (ZESTORETIC) 20-12.5 MG tablet  Take 1 tablet by mouth 2 (two) times daily.   melatonin 3 MG TABS tablet Take 3 mg by mouth at bedtime.   Multiple Vitamins-Minerals (MULTIVITAMIN WITH MINERALS) tablet Take 1 tablet by mouth daily.   Potassium Chloride ER 20 MEQ TBCR Take 1 tablet by mouth daily.   sertraline (ZOLOFT) 100 MG tablet Take 100 mg by mouth daily.   Zinc Oxide (TRIPLE PASTE) 12.8 % ointment Apply 1 Application topically as needed for irritation. Every shift.   [DISCONTINUED] hydrALAZINE (APRESOLINE) 25 MG tablet Take 1 tablet (25 mg total) by mouth every 8 (eight) hours.   No facility-administered encounter medications on file as of 09/12/2022.    Review of Systems  Immunization History  Administered Date(s) Administered   Influenza Inj Mdck Quad Pf 01/14/2019   Influenza,inj,Quad PF,6+ Mos 03/21/2020   Influenza-Unspecified 01/18/2016, 01/16/2017, 01/29/2018   Moderna Sars-Covid-2 Vaccination 12/01/2019, 12/29/2019   Td 10/26/2017   Td (Adult),5 Lf Tetanus Toxid, Preservative Free 10/26/2017   Td (Adult),unspecified 05/01/2008   Tdap 05/01/2008   Zoster, Live 04/28/2009   Pertinent  Health Maintenance Due  Topic Date Due   INFLUENZA VACCINE  11/27/2022      05/03/2022    8:10 AM 05/03/2022    8:00 PM 05/04/2022    8:35 AM 05/04/2022    8:00 PM 05/05/2022   11:00 AM  Fall Risk  (RETIRED) Patient Fall Risk Level High fall  risk High fall risk High fall risk High fall risk High fall risk   Functional Status Survey:    Vitals:   09/12/22 0934  BP: 137/74  Pulse: 73  Resp: 17  Temp: 98.1 F (36.7 C)  SpO2: 96%  Weight: 183 lb 3.2 oz (83.1 kg)  Height: 6' (1.829 m)   Body mass index is 24.85 kg/m. Physical Exam Constitutional:      Appearance: He is normal weight.  HENT:     Mouth/Throat:     Comments: Poor dentition Cardiovascular:     Rate and Rhythm: Normal rate and regular rhythm.     Pulses: Normal pulses.     Heart sounds: Normal heart sounds.  Pulmonary:     Effort: Pulmonary  effort is normal.     Breath sounds: Normal breath sounds.  Abdominal:     Palpations: Abdomen is soft.  Neurological:     General: No focal deficit present.     Mental Status: He is alert. He is disoriented.     Labs reviewed: Recent Labs    05/05/22 0515 08/03/22 1958 08/03/22 2230 08/04/22 0455 08/06/22 1018 08/08/22 0838 08/12/22 0631  NA 138 139  --  139  --   --  140  K 3.5 3.2*  --  3.3* 3.6 3.3* 3.8  CL 104 102  --  103  --   --  103  CO2 27 28  --  29  --   --  30  GLUCOSE 95 110*  --  94  --   --  99  BUN 25* 18  --  18  --   --  32*  CREATININE 1.34* 1.12  --  0.98  --   --  1.17  CALCIUM 8.6* 8.5*  --  8.8*  --   --  9.0  MG 1.9  --  2.2  --   --   --  2.3   Recent Labs    04/28/22 1951  AST 23  ALT 15  ALKPHOS 73  BILITOT 0.7  PROT 7.0  ALBUMIN 3.3*   Recent Labs    04/28/22 1951 04/30/22 0557 08/03/22 1958 08/04/22 0455 08/09/22 0442  WBC 8.0   < > 6.5 7.1 6.6  NEUTROABS 5.7  --   --   --   --   HGB 13.1   < > 11.6* 12.0* 12.2*  HCT 40.7   < > 35.7* 37.1* 37.4*  MCV 83.7   < > 84.8 84.3 83.5  PLT 237   < > 188 179 220   < > = values in this interval not displayed.   Lab Results  Component Value Date   TSH 1.511 08/04/2022   No results found for: "HGBA1C" No results found for: "CHOL", "HDL", "LDLCALC", "LDLDIRECT", "TRIG", "CHOLHDL"  Significant Diagnostic Results in last 30 days:  No results found.  Assessment/Plan 1. Dementia with behavioral disturbance [facility]) Patient here for long term care. Patient's weight is now stabilized. He is transitioning well and is now more conversant than previously. Behavioral concerns minimal at this time. Continue Aricept and Sertraline  2. Atrial fibrillation, chronic (HCC) Rate well-controlled. Creatinine 1.1, will need to decrease eliquis to 2.5 mg BID based on age and kidney function.   3. Essential hypertension BP well-controlled with amlodipine, zestoretic. Continue quarterly BMP. Continue  potassium supplementation  4. Spastic torticollis No medications at this time, continue to monitor and provide supportive care  5. Stage 3a chronic kidney disease (  HCC) Avoid nephrotoxic medications, decrease dose of eliquis based on current kidney function.   6. Multiple falls Patient had multiple falls prior to admission. PT as tolerated and will require 24 hour supervision.   7. Malnutrition of moderate degree (HCC) Patient has tolerated a regular diet and his appetite has improved. Will continue to monitor and supplement protein as indicated.     Family/ staff Communication: nursing  Labs/tests ordered:  none

## 2022-09-14 ENCOUNTER — Encounter: Payer: Self-pay | Admitting: Student

## 2022-10-02 ENCOUNTER — Non-Acute Institutional Stay (SKILLED_NURSING_FACILITY): Payer: Medicare Other | Admitting: Nurse Practitioner

## 2022-10-02 ENCOUNTER — Encounter: Payer: Self-pay | Admitting: Nurse Practitioner

## 2022-10-02 DIAGNOSIS — F03918 Unspecified dementia, unspecified severity, with other behavioral disturbance: Secondary | ICD-10-CM | POA: Diagnosis not present

## 2022-10-02 DIAGNOSIS — F419 Anxiety disorder, unspecified: Secondary | ICD-10-CM | POA: Diagnosis not present

## 2022-10-02 NOTE — Progress Notes (Deleted)
Location:  Other [place].  Nursing Home Room Number: [facility] [address]A Place of Service:  SNF [phone]) Abbey Chatters, NP  PCP: Earnestine Mealing, MD  Patient Care Team: Earnestine Mealing, MD as PCP - General [facility] Medicine)  Extended Emergency Contact Information Primary Emergency Contact: Merry Proud Address: [address]          [place], [place] [address] Home Phone: [phone] Mobile Phone: [phone] Relation: Spouse Secondary Emergency Contact: Rusk,[name] Mobile Phone: [phone] Relation: Son  Goals of care: Advanced Directive information    10/02/2022    2:06 PM  Advanced Directives  Does Patient Have a Medical Advance Directive? No  Would patient like information on creating a medical advance directive? No - Patient declined     Chief Complaint  Patient presents with   Acute Visit    Anxiety    HPI:  Pt is a 87 y.o. male seen today for an acute visit for Anxiety   Past Medical History:  Diagnosis Date   Atrial fibrillation (HCC)    persistent   on NOAC   Hypertension    Vitamin B 12 deficiency    History reviewed. No pertinent surgical history.  No Known Allergies  Outpatient Encounter Medications as of 10/02/2022  Medication Sig   apixaban (ELIQUIS) 5 MG TABS tablet Take 5 mg by mouth 2 (two) times daily.   donepezil (ARICEPT) 10 MG tablet Take 1 tablet by mouth at bedtime.   lisinopril-hydrochlorothiazide (ZESTORETIC) 20-12.5 MG tablet Take 1 tablet by mouth 2 (two) times daily.   melatonin 3 MG TABS tablet Take 3 mg by mouth at bedtime.   Multiple Vitamins-Minerals (MULTIVITAMIN WITH MINERALS) tablet Take 1 tablet by mouth daily.   Potassium Chloride ER 20 MEQ TBCR Take 1 tablet by mouth daily.   sertraline (ZOLOFT) 100 MG tablet Take 100 mg by mouth daily.   Zinc Oxide (TRIPLE PASTE) 12.8 % ointment Apply 1 Application topically as needed for irritation. Every shift.   amLODipine (NORVASC) 5 MG tablet Take 1 tablet (5 mg total) by  mouth daily.   No facility-administered encounter medications on file as of 10/02/2022.    Review of Systems***  Immunization History  Administered Date(s) Administered   Influenza Inj Mdck Quad Pf 01/14/2019   Influenza,inj,Quad PF,6+ Mos 03/21/2020   Influenza-Unspecified 01/18/2016, 01/16/2017, 01/29/2018   Moderna Sars-Covid-2 Vaccination 12/01/2019, 12/29/2019   Td 10/26/2017   Td (Adult),5 Lf Tetanus Toxid, Preservative Free 10/26/2017   Td (Adult),unspecified 05/01/2008   Tdap 05/01/2008   Zoster, Live 04/28/2009   Pertinent  Health Maintenance Due  Topic Date Due   INFLUENZA VACCINE  11/27/2022      05/03/2022    8:10 AM 05/03/2022    8:00 PM 05/04/2022    8:35 AM 05/04/2022    8:00 PM 05/05/2022   11:00 AM  Fall Risk  (RETIRED) Patient Fall Risk Level High fall risk High fall risk High fall risk High fall risk High fall risk   Functional Status Survey:    Vitals:   10/02/22 1403 10/02/22 1409  BP: (!) 156/70 137/74  Pulse: 90   Resp: 13   Temp: 98.9 F (37.2 C)   SpO2: 94%   Weight: 185 lb 12.8 oz (84.3 kg)   Height: 6' (1.829 m)    Body mass index is 25.2 kg/m. Physical Exam***  Labs reviewed: Recent Labs    05/05/22 0515 08/03/22 1958 08/03/22 2230 08/04/22 0455 08/06/22 1018 08/08/22 0838 08/12/22 0631  NA 138  139  --  139  --   --  140  K 3.5 3.2*  --  3.3* 3.6 3.3* 3.8  CL 104 102  --  103  --   --  103  CO2 27 28  --  29  --   --  30  GLUCOSE 95 110*  --  94  --   --  99  BUN 25* 18  --  18  --   --  32*  CREATININE 1.34* 1.12  --  0.98  --   --  1.17  CALCIUM 8.6* 8.5*  --  8.8*  --   --  9.0  MG 1.9  --  2.2  --   --   --  2.3   Recent Labs    04/28/22 1951  AST 23  ALT 15  ALKPHOS 73  BILITOT 0.7  PROT 7.0  ALBUMIN 3.3*   Recent Labs    04/28/22 1951 04/30/22 0557 08/03/22 1958 08/04/22 0455 08/09/22 0442  WBC 8.0   < > 6.5 7.1 6.6  NEUTROABS 5.7  --   --   --   --   HGB 13.1   < > 11.6* 12.0* 12.2*  HCT 40.7   < > 35.7*  37.1* 37.4*  MCV 83.7   < > 84.8 84.3 83.5  PLT 237   < > 188 179 220   < > = values in this interval not displayed.   Lab Results  Component Value Date   TSH 1.511 08/04/2022   No results found for: "HGBA1C" No results found for: "CHOL", "HDL", "LDLCALC", "LDLDIRECT", "TRIG", "CHOLHDL"  Significant Diagnostic Results in last 30 days:  No results found.  Assessment/Plan There are no diagnoses linked to this encounter.   Janene Harvey. Biagio Borg [facility] [phone]

## 2022-10-02 NOTE — Progress Notes (Signed)
Location:  Other Nursing Home Room Number: [facility] [address] Place of Service:  SNF [phone])  Sydnee Cabal, [place], MD  Patient Care Team: Earnestine Mealing, MD as PCP - General [facility] Medicine)  Extended Emergency Contact Information Primary Emergency Contact: Merry Proud Address: [address]          [place], [place] [address] Home Phone: [phone] Mobile Phone: [phone] Relation: Spouse Secondary Emergency Contact: Folds,[name] Mobile Phone: [phone] Relation: Son  Goals of care: Advanced Directive information    10/02/2022    2:06 PM  Advanced Directives  Does Patient Have a Medical Advance Directive? No  Would patient like information on creating a medical advance directive? No - Patient declined     Chief Complaint  Patient presents with   Acute Visit    Anxiety    HPI:  Pt is a 87 y.o. male seen today for an acute visit for worsening sundowners and anxiety.  Nursing reports he has had a lot more anxiety and crying to go home.  Behaviors have progressively been getting worse since he has been in SNF.  This is worse in the evening. Daughter reports this was ongoing when he was at home.  He has been on aricept at bedtime and on zoloft for depression/anxiety at this time.    Past Medical History:  Diagnosis Date   Atrial fibrillation (HCC)    persistent   on NOAC   Hypertension    Vitamin B 12 deficiency    History reviewed. No pertinent surgical history.  No Known Allergies  Outpatient Encounter Medications as of 10/02/2022  Medication Sig   apixaban (ELIQUIS) 5 MG TABS tablet Take 5 mg by mouth 2 (two) times daily.   donepezil (ARICEPT) 10 MG tablet Take 1 tablet by mouth at bedtime.   lisinopril-hydrochlorothiazide (ZESTORETIC) 20-12.5 MG tablet Take 1 tablet by mouth 2 (two) times daily.   melatonin 3 MG TABS tablet Take 3 mg by mouth at bedtime.   Multiple Vitamins-Minerals (MULTIVITAMIN WITH MINERALS) tablet Take 1 tablet by mouth daily.    Potassium Chloride ER 20 MEQ TBCR Take 1 tablet by mouth daily.   sertraline (ZOLOFT) 100 MG tablet Take 100 mg by mouth daily.   Zinc Oxide (TRIPLE PASTE) 12.8 % ointment Apply 1 Application topically as needed for irritation. Every shift.   amLODipine (NORVASC) 5 MG tablet Take 1 tablet (5 mg total) by mouth daily.   No facility-administered encounter medications on file as of 10/02/2022.    Review of Systems  Unable to perform ROS: Dementia    Immunization History  Administered Date(s) Administered   Influenza Inj Mdck Quad Pf 01/14/2019   Influenza,inj,Quad PF,6+ Mos 03/21/2020   Influenza-Unspecified 01/18/2016, 01/16/2017, 01/29/2018   Moderna Sars-Covid-2 Vaccination 12/01/2019, 12/29/2019   Td 10/26/2017   Td (Adult),5 Lf Tetanus Toxid, Preservative Free 10/26/2017   Td (Adult),unspecified 05/01/2008   Tdap 05/01/2008   Zoster, Live 04/28/2009   Pertinent  Health Maintenance Due  Topic Date Due   INFLUENZA VACCINE  11/27/2022      05/03/2022    8:10 AM 05/03/2022    8:00 PM 05/04/2022    8:35 AM 05/04/2022    8:00 PM 05/05/2022   11:00 AM  Fall Risk  (RETIRED) Patient Fall Risk Level High fall risk High fall risk High fall risk High fall risk High fall risk   Functional Status Survey:    Vitals:   10/02/22 1403 10/02/22 1409  BP: (!) 156/70 137/74  Pulse: 90  Resp: 13   Temp: 98.9 F (37.2 C)   SpO2: 94%   Weight: 185 lb 12.8 oz (84.3 kg)   Height: 6' (1.829 m)    Body mass index is 25.2 kg/m. Physical Exam Constitutional:      Appearance: Normal appearance.  Neurological:     Mental Status: He is alert. Mental status is at baseline.  Psychiatric:        Mood and Affect: Mood normal.     Labs reviewed: Recent Labs    05/05/22 0515 08/03/22 1958 08/03/22 2230 08/04/22 0455 08/06/22 1018 08/08/22 0838 08/12/22 0631  NA 138 139  --  139  --   --  140  K 3.5 3.2*  --  3.3* 3.6 3.3* 3.8  CL 104 102  --  103  --   --  103  CO2 27 28  --  29  --   --   30  GLUCOSE 95 110*  --  94  --   --  99  BUN 25* 18  --  18  --   --  32*  CREATININE 1.34* 1.12  --  0.98  --   --  1.17  CALCIUM 8.6* 8.5*  --  8.8*  --   --  9.0  MG 1.9  --  2.2  --   --   --  2.3   Recent Labs    04/28/22 1951  AST 23  ALT 15  ALKPHOS 73  BILITOT 0.7  PROT 7.0  ALBUMIN 3.3*   Recent Labs    04/28/22 1951 04/30/22 0557 08/03/22 1958 08/04/22 0455 08/09/22 0442  WBC 8.0   < > 6.5 7.1 6.6  NEUTROABS 5.7  --   --   --   --   HGB 13.1   < > 11.6* 12.0* 12.2*  HCT 40.7   < > 35.7* 37.1* 37.4*  MCV 83.7   < > 84.8 84.3 83.5  PLT 237   < > 188 179 220   < > = values in this interval not displayed.   Lab Results  Component Value Date   TSH 1.511 08/04/2022   No results found for: "HGBA1C" No results found for: "CHOL", "HDL", "LDLCALC", "LDLDIRECT", "TRIG", "CHOLHDL"  Significant Diagnostic Results in last 30 days:  No results found.  Assessment/Plan 1. Dementia with behavioral disturbance (HCC) -ongoing, will continue aricept and add namenda titration at this time to see if this helps with some of the anxiety related to dementia.   2. Anxiety -continues on zoloft 100 mg daily with supportive care.     Janene Harvey. Biagio Borg [facility] [phone]

## 2022-10-04 ENCOUNTER — Telehealth: Payer: Self-pay | Admitting: Internal Medicine

## 2022-10-04 NOTE — Telephone Encounter (Signed)
Nurse called and said patient got Ativan in the morning 0.5 mg for his agitation but it did not help He is very anxious Staff is sitting with him trying to calm him Will try one more dose of Ativan 0.5 mg

## 2022-10-04 NOTE — Telephone Encounter (Signed)
Patient Continued to be very aggressive inspite of Ativan extra dose Hitting staff and resisting care Aggressive to other Residents I gave order of Haldol 5 mg I/M One time order He responded well  Became calmer Vitals stable after 1 hour Will continue to monitor.

## 2022-10-06 ENCOUNTER — Encounter: Payer: Self-pay | Admitting: Student

## 2022-10-06 ENCOUNTER — Non-Acute Institutional Stay (SKILLED_NURSING_FACILITY): Payer: Medicare Other | Admitting: Student

## 2022-10-06 DIAGNOSIS — R220 Localized swelling, mass and lump, head: Secondary | ICD-10-CM | POA: Diagnosis not present

## 2022-10-06 DIAGNOSIS — I10 Essential (primary) hypertension: Secondary | ICD-10-CM | POA: Diagnosis not present

## 2022-10-06 DIAGNOSIS — F03918 Unspecified dementia, unspecified severity, with other behavioral disturbance: Secondary | ICD-10-CM

## 2022-10-06 NOTE — Progress Notes (Unsigned)
Location:  Other [facility] [place].  Nursing Home Room Number: [facility] [address] Place of Service:  SNF [phone]) Provider:  Earnestine Mealing, MD  Patient Care Team: Earnestine Mealing, MD as PCP - General [facility] Medicine)  Extended Emergency Contact Information Primary Emergency Contact: Merry Proud Address: [address]          [place], [place] [address] Home Phone: [phone] Mobile Phone: [phone] Relation: Spouse Secondary Emergency Contact: Jodoin,[name] Mobile Phone: [phone] Relation: Son  Code Status:  Full Code.  Goals of care: Advanced Directive information    10/06/2022    9:48 AM  Advanced Directives  Does Patient Have a Medical Advance Directive? No  Would patient like information on creating a medical advance directive? No - Patient declined     Chief Complaint  Patient presents with   Acute Visit    Behaviors.     HPI:  Pt is a 87 y.o. male seen today for an acute visit for Behaviors.   Patient is smiling and eating breakfast. He is unable to give his name or details about his life.   Received two messages this weekend regarding increased behaviors over the weekend. Patient was just started on Namenda. Patient was being aggressive to nursing. Received ativan and haldol, only for haldol to help. Patient was threatening staff and patients.    Past Medical History:  Diagnosis Date   Atrial fibrillation (HCC)    persistent   on NOAC   Hypertension    Vitamin B 12 deficiency    History reviewed. No pertinent surgical history.  No Known Allergies  Outpatient Encounter Medications as of 10/06/2022  Medication Sig   amLODipine (NORVASC) 5 MG tablet Take 1 tablet (5 mg total) by mouth daily.   apixaban (ELIQUIS) 5 MG TABS tablet Take 5 mg by mouth 2 (two) times daily.   donepezil (ARICEPT) 10 MG tablet Take 1 tablet by mouth at bedtime.   lisinopril-hydrochlorothiazide (ZESTORETIC) 20-12.5 MG tablet Take 1 tablet by mouth 2 (two) times daily.    melatonin 3 MG TABS tablet Take 3 mg by mouth at bedtime.   memantine (NAMENDA) 10 MG tablet Take 10 mg by mouth daily. Until 10/26/22. Give One tablet by mouth two times daily.   memantine (NAMENDA) 5 MG tablet Take 5 mg by mouth at bedtime. Until 10/26/22. Give one by mouth one time daily until 10/10/2022. Give one tablet by mouth two times daily until 10/18/22   Multiple Vitamins-Minerals (MULTIVITAMIN WITH MINERALS) tablet Take 1 tablet by mouth daily.   Potassium Chloride ER 20 MEQ TBCR Take 1 tablet by mouth daily.   sertraline (ZOLOFT) 100 MG tablet Take 100 mg by mouth daily.   Zinc Oxide (TRIPLE PASTE) 12.8 % ointment Apply 1 Application topically as needed for irritation. Every shift.   No facility-administered encounter medications on file as of 10/06/2022.    Review of Systems  Immunization History  Administered Date(s) Administered   Influenza Inj Mdck Quad Pf 01/14/2019   Influenza,inj,Quad PF,6+ Mos 03/21/2020   Influenza-Unspecified 01/18/2016, 01/16/2017, 01/29/2018   Moderna Sars-Covid-2 Vaccination 12/01/2019, 12/29/2019   Td 10/26/2017   Td (Adult),5 Lf Tetanus Toxid, Preservative Free 10/26/2017   Td (Adult),unspecified 05/01/2008   Tdap 05/01/2008   Zoster, Live 04/28/2009   Pertinent  Health Maintenance Due  Topic Date Due   INFLUENZA VACCINE  11/27/2022      05/03/2022    8:10 AM 05/03/2022    8:00 PM 05/04/2022    8:35 AM 05/04/2022  8:00 PM 05/05/2022   11:00 AM  Fall Risk  (RETIRED) Patient Fall Risk Level High fall risk High fall risk High fall risk High fall risk High fall risk   Functional Status Survey:    Vitals:   10/06/22 0942  BP: 121/69  Pulse: 64  Resp: 16  Temp: 98.4 F (36.9 C)  SpO2: 95%  Weight: 185 lb 12.8 oz (84.3 kg)  Height: 6' (1.829 m)   Body mass index is 25.2 kg/m. Physical Exam HENT:     Head:     Comments: Left cheek tissue swelling. No palpable fluctuance, induration, or surrouding erythema.  Cardiovascular:     Rate  and Rhythm: Normal rate.     Pulses: Normal pulses.  Pulmonary:     Effort: Pulmonary effort is normal.  Neurological:     Mental Status: He is alert. He is disoriented.     Labs reviewed: Recent Labs    05/05/22 0515 08/03/22 1958 08/03/22 2230 08/04/22 0455 08/06/22 1018 08/08/22 0838 08/12/22 0631  NA 138 139  --  139  --   --  140  K 3.5 3.2*  --  3.3* 3.6 3.3* 3.8  CL 104 102  --  103  --   --  103  CO2 27 28  --  29  --   --  30  GLUCOSE 95 110*  --  94  --   --  99  BUN 25* 18  --  18  --   --  32*  CREATININE 1.34* 1.12  --  0.98  --   --  1.17  CALCIUM 8.6* 8.5*  --  8.8*  --   --  9.0  MG 1.9  --  2.2  --   --   --  2.3   Recent Labs    04/28/22 1951  AST 23  ALT 15  ALKPHOS 73  BILITOT 0.7  PROT 7.0  ALBUMIN 3.3*   Recent Labs    04/28/22 1951 04/30/22 0557 08/03/22 1958 08/04/22 0455 08/09/22 0442  WBC 8.0   < > 6.5 7.1 6.6  NEUTROABS 5.7  --   --   --   --   HGB 13.1   < > 11.6* 12.0* 12.2*  HCT 40.7   < > 35.7* 37.1* 37.4*  MCV 83.7   < > 84.8 84.3 83.5  PLT 237   < > 188 179 220   < > = values in this interval not displayed.   Lab Results  Component Value Date   TSH 1.511 08/04/2022   No results found for: "HGBA1C" No results found for: "CHOL", "HDL", "LDLCALC", "LDLDIRECT", "TRIG", "CHOLHDL"  Significant Diagnostic Results in last 30 days:  No results found.  Assessment/Plan Dementia with behavioral disturbance (HCC)  Facial swelling  Essential hypertension Patient with increased behaviors over the weekend. Plan to start trazodone 25 mg BID PRN for agitation. Avoid antipsychotics if possible, however, if this fails to aid during an episode, will need to trail a different drug. Facial swelling unexplained. Improving with Ice. Labs to see if there is a metabolic cause or infectious process. Discontinue lisinopril at this time. No pharyngeal swelling at this time, however, to prevent any additional mouth swelling will discontinue  ACEi. Consider discontinuation of Namenda given new medication.   Family/ staff Communication: nursing  Labs/tests ordered:  CBC, CMP

## 2022-10-07 ENCOUNTER — Encounter: Payer: Self-pay | Admitting: Student

## 2022-10-09 LAB — COMPREHENSIVE METABOLIC PANEL
Albumin: 3.5 (ref 3.5–5.0)
Calcium: 8.9 (ref 8.7–10.7)
Globulin: 2.4
eGFR: 56

## 2022-10-09 LAB — CBC AND DIFFERENTIAL
HCT: 37 — AB (ref 41–53)
Hemoglobin: 11.9 — AB (ref 13.5–17.5)
Platelets: 226 10*3/uL (ref 150–400)
WBC: 6.3

## 2022-10-09 LAB — HEPATIC FUNCTION PANEL
ALT: 12 U/L (ref 10–40)
AST: 13 — AB (ref 14–40)
Alkaline Phosphatase: 81 (ref 25–125)
Bilirubin, Total: 0.5

## 2022-10-09 LAB — BASIC METABOLIC PANEL
BUN: 22 — AB (ref 4–21)
CO2: 30 — AB (ref 13–22)
Chloride: 103 (ref 99–108)
Creatinine: 1.3 (ref 0.6–1.3)
Glucose: 81
Potassium: 3.5 mEq/L (ref 3.5–5.1)
Sodium: 140 (ref 137–147)

## 2022-10-09 LAB — CBC: RBC: 4.32 (ref 3.87–5.11)

## 2022-10-16 LAB — COMPREHENSIVE METABOLIC PANEL
Calcium: 8.9 (ref 8.7–10.7)
eGFR: 59

## 2022-10-16 LAB — BASIC METABOLIC PANEL
BUN: 15 (ref 4–21)
CO2: 33 — AB (ref 13–22)
Chloride: 100 (ref 99–108)
Creatinine: 1.2 (ref 0.6–1.3)
Glucose: 78
Potassium: 3.4 mEq/L — AB (ref 3.5–5.1)
Sodium: 138 (ref 137–147)

## 2022-10-21 ENCOUNTER — Non-Acute Institutional Stay (SKILLED_NURSING_FACILITY): Payer: Medicare Other | Admitting: Nurse Practitioner

## 2022-10-21 ENCOUNTER — Encounter: Payer: Self-pay | Admitting: Nurse Practitioner

## 2022-10-21 DIAGNOSIS — R296 Repeated falls: Secondary | ICD-10-CM

## 2022-10-21 DIAGNOSIS — E440 Moderate protein-calorie malnutrition: Secondary | ICD-10-CM

## 2022-10-21 DIAGNOSIS — I482 Chronic atrial fibrillation, unspecified: Secondary | ICD-10-CM

## 2022-10-21 DIAGNOSIS — N1831 Chronic kidney disease, stage 3a: Secondary | ICD-10-CM | POA: Diagnosis not present

## 2022-10-21 DIAGNOSIS — I10 Essential (primary) hypertension: Secondary | ICD-10-CM | POA: Diagnosis not present

## 2022-10-21 DIAGNOSIS — F03918 Unspecified dementia, unspecified severity, with other behavioral disturbance: Secondary | ICD-10-CM

## 2022-10-21 DIAGNOSIS — F419 Anxiety disorder, unspecified: Secondary | ICD-10-CM

## 2022-10-21 NOTE — Progress Notes (Signed)
Location:  [facility] [place].  Nursing Home Room Number: [facility] [address] Place of Service:  SNF [phone]) Abbey Chatters, NP  PCP: Earnestine Mealing, MD  Patient Care Team: Earnestine Mealing, MD as PCP - General [facility] Medicine)  Extended Emergency Contact Information Primary Emergency Contact: Merry Proud Address: [address]          [place], [place] [address] Home Phone: [phone] Mobile Phone: [phone] Relation: Spouse Secondary Emergency Contact: Hansmann,[name] Mobile Phone: [phone] Relation: Son  Goals of care: Advanced Directive information    10/21/2022   10:22 AM  Advanced Directives  Does Patient Have a Medical Advance Directive? No  Would patient like information on creating a medical advance directive? No - Patient declined     Chief Complaint  Patient presents with   Medical Management of Chronic Issues    Medical Management of Chronic Issues.     HPI:  Pt is a 87 y.o. male seen today for medical management of chronic disease.  Pt continues to have behaviors towards staff. Namenda and trazodone have been started in the last month but without significant benefit.  Continues to be very unsteady and attempts to get out of wheelchair often.   Lisinopril was stopped, continues on norvasc with hctz    Past Medical History:  Diagnosis Date   Atrial fibrillation (HCC)    persistent   on NOAC   Hypertension    Vitamin B 12 deficiency    History reviewed. No pertinent surgical history.  No Known Allergies  Outpatient Encounter Medications as of 10/21/2022  Medication Sig   amLODipine (NORVASC) 5 MG tablet Take 1 tablet (5 mg total) by mouth daily.   apixaban (ELIQUIS) 5 MG TABS tablet Take 5 mg by mouth 2 (two) times daily.   donepezil (ARICEPT) 10 MG tablet Take 1 tablet by mouth at bedtime.   hydrochlorothiazide (HYDRODIURIL) 25 MG tablet Take 25 mg by mouth daily.   melatonin 3 MG TABS tablet Take 3 mg by mouth at bedtime.   memantine  (NAMENDA) 10 MG tablet Take 10 mg by mouth daily. Until 10/26/22. Give One tablet by mouth two times daily.   memantine (NAMENDA) 5 MG tablet Take 5 mg by mouth at bedtime. Until 10/26/22. Give one by mouth one time daily until 10/10/2022. Give one tablet by mouth two times daily until 10/18/22   Multiple Vitamins-Minerals (MULTIVITAMIN WITH MINERALS) tablet Take 1 tablet by mouth daily.   Potassium Chloride ER 20 MEQ TBCR Take 1 tablet by mouth daily.   sertraline (ZOLOFT) 100 MG tablet Take 100 mg by mouth daily.   traZODone (DESYREL) 50 MG tablet Take 25 mg by mouth 2 (two) times daily.   Zinc Oxide (TRIPLE PASTE) 12.8 % ointment Apply 1 Application topically as needed for irritation. Every shift.   [DISCONTINUED] lisinopril-hydrochlorothiazide (ZESTORETIC) 20-12.5 MG tablet Take 1 tablet by mouth 2 (two) times daily.   No facility-administered encounter medications on file as of 10/21/2022.    Review of Systems  Unable to perform ROS: Dementia     Immunization History  Administered Date(s) Administered   Influenza Inj Mdck Quad Pf 01/14/2019   Influenza,inj,Quad PF,6+ Mos 03/21/2020   Influenza-Unspecified 01/18/2016, 01/16/2017, 01/29/2018   Moderna Sars-Covid-2 Vaccination 12/01/2019, 12/29/2019   Td 10/26/2017   Td (Adult),5 Lf Tetanus Toxid, Preservative Free 10/26/2017   Td (Adult),unspecified 05/01/2008   Tdap 05/01/2008   Zoster, Live 04/28/2009   Pertinent  Health Maintenance Due  Topic Date Due  INFLUENZA VACCINE  11/27/2022      05/03/2022    8:10 AM 05/03/2022    8:00 PM 05/04/2022    8:35 AM 05/04/2022    8:00 PM 05/05/2022   11:00 AM  Fall Risk  (RETIRED) Patient Fall Risk Level High fall risk High fall risk High fall risk High fall risk High fall risk   Functional Status Survey:    Vitals:   10/21/22 1000  BP: 121/69  Pulse: 64  Resp: 16  Temp: 98.4 F (36.9 C)  SpO2: 95%  Weight: 185 lb 8 oz (84.1 kg)  Height: 6' (1.829 m)   Body mass index is 25.16  kg/m. Physical Exam Constitutional:      General: He is not in acute distress.    Appearance: He is well-developed. He is not diaphoretic.  HENT:     Head: Normocephalic and atraumatic.     Mouth/Throat:     Pharynx: No oropharyngeal exudate.  Eyes:     Conjunctiva/sclera: Conjunctivae normal.     Pupils: Pupils are equal, round, and reactive to light.  Cardiovascular:     Rate and Rhythm: Normal rate and regular rhythm.     Heart sounds: Normal heart sounds.  Pulmonary:     Effort: Pulmonary effort is normal.     Breath sounds: Normal breath sounds.  Abdominal:     General: Bowel sounds are normal.     Palpations: Abdomen is soft.  Musculoskeletal:        General: No tenderness.     Cervical back: Normal range of motion and neck supple.  Skin:    General: Skin is warm and dry.  Neurological:     Mental Status: He is alert. He is disoriented.     Motor: Weakness present.     Gait: Gait abnormal.  Psychiatric:        Cognition and Memory: Memory is impaired. He exhibits impaired recent memory and impaired remote memory.     Labs reviewed: Recent Labs    05/05/22 0515 08/03/22 1958 08/03/22 2230 08/04/22 0455 08/06/22 1018 08/12/22 0631 10/09/22 0000 10/16/22 0000  NA 138 139  --  139  --  140 140 138  K 3.5 3.2*  --  3.3*   < > 3.8 3.5 3.4*  CL 104 102  --  103  --  103 103 100  CO2 27 28  --  29  --  30 30* 33*  GLUCOSE 95 110*  --  94  --  99  --   --   BUN 25* 18  --  18  --  32* 22* 15  CREATININE 1.34* 1.12  --  0.98  --  1.17 1.3 1.2  CALCIUM 8.6* 8.5*  --  8.8*  --  9.0 8.9 8.9  MG 1.9  --  2.2  --   --  2.3  --   --    < > = values in this interval not displayed.   Recent Labs    04/28/22 1951 10/09/22 0000  AST 23 13*  ALT 15 12  ALKPHOS 73 81  BILITOT 0.7  --   PROT 7.0  --   ALBUMIN 3.3* 3.5   Recent Labs    04/28/22 1951 04/30/22 0557 08/03/22 1958 08/04/22 0455 08/09/22 0442 10/09/22 0000  WBC 8.0   < > 6.5 7.1 6.6 6.3   NEUTROABS 5.7  --   --   --   --   --  HGB 13.1   < > 11.6* 12.0* 12.2* 11.9*  HCT 40.7   < > 35.7* 37.1* 37.4* 37*  MCV 83.7   < > 84.8 84.3 83.5  --   PLT 237   < > 188 179 220 226   < > = values in this interval not displayed.   Lab Results  Component Value Date   TSH 1.511 08/04/2022   No results found for: "HGBA1C" No results found for: "CHOL", "HDL", "LDLCALC", "LDLDIRECT", "TRIG", "CHOLHDL"  Significant Diagnostic Results in last 30 days:  No results found.  Assessment/Plan 1. Essential hypertension Will have staff check bp weekly to monitor since lisinopril was stopped  2. Atrial fibrillation, chronic (HCC) -rate controlled, not on any medication   3. Stage 3a chronic kidney disease (HCC) -Chronic and stable Encourage proper hydration Follow metabolic panel Avoid nephrotoxic meds (NSAIDS)  4. Multiple falls Fall precautions in place Avoid antipsychotics  5. Dementia with behavioral disturbance (HCC) -ongoing, no changes with recent medications increase in anxiety in the evening.  Will add buspar 5 mg by mouth twice daily   6. Malnutrition of moderate degree (HCC) -improving, continue nutritional support.   7. Anxiety Ongoing, worse in the evening. Will add buspar 5 mg BID to regimen to see if this helps.  Will stop trazodone as this has not changed symptoms.    Janene Harvey. Biagio Borg [facility] [phone]

## 2022-11-17 ENCOUNTER — Non-Acute Institutional Stay (SKILLED_NURSING_FACILITY): Payer: Medicare Other | Admitting: Student

## 2022-11-17 ENCOUNTER — Encounter: Payer: Self-pay | Admitting: Student

## 2022-11-17 DIAGNOSIS — N1831 Chronic kidney disease, stage 3a: Secondary | ICD-10-CM | POA: Diagnosis not present

## 2022-11-17 DIAGNOSIS — I10 Essential (primary) hypertension: Secondary | ICD-10-CM | POA: Diagnosis not present

## 2022-11-17 DIAGNOSIS — I482 Chronic atrial fibrillation, unspecified: Secondary | ICD-10-CM | POA: Diagnosis not present

## 2022-11-17 DIAGNOSIS — E440 Moderate protein-calorie malnutrition: Secondary | ICD-10-CM

## 2022-11-17 DIAGNOSIS — F3341 Major depressive disorder, recurrent, in partial remission: Secondary | ICD-10-CM

## 2022-11-17 DIAGNOSIS — F03918 Unspecified dementia, unspecified severity, with other behavioral disturbance: Secondary | ICD-10-CM

## 2022-11-17 NOTE — Progress Notes (Unsigned)
Location:   [facility] Room Number: 414 A Place of Service:  SNF [phone]) Provider:  Earnestine Mealing, MD  Earnestine Mealing, MD  Patient Care Team: Earnestine Mealing, MD as PCP - General (Family Medicine)  Extended Emergency Contact Information Primary Emergency Contact: Merry Proud Address: [address]          [place], [place] [address] Home Phone: [phone] Mobile Phone: [phone] Relation: Spouse Secondary Emergency Contact: Reaume,[name] Mobile Phone: [phone] Relation: Son  Code Status:  FULL CODE Goals of care: Advanced Directive information    11/17/2022    9:01 AM  Advanced Directives  Does Patient Have a Medical Advance Directive? No     Chief Complaint  Patient presents with   Medical Management of Chronic Issues    Routine follow up   Immunizations    Pneumonia vaccine, shingles vaccine and COVID booster due.   Quality Metric Gaps    Medicare annual wellness visit due.    HPI:  Pt is a 87 y.o. male seen today for medical management of chronic diseases.   Patient gives his name. He says "I don't know" to date, year, birthday, month, location. He says, "what's all this about?"    Nursing has continued concern that patient has outbursts as well as high fall risk. He requires constant supervision and redirection in the afternoon.   Past Medical History:  Diagnosis Date   Atrial fibrillation (HCC)    persistent   on NOAC   Hypertension    Vitamin B 12 deficiency    History reviewed. No pertinent surgical history.  No Known Allergies  Allergies as of 11/17/2022   No Known Allergies      Medication List        Accurate as of November 17, 2022  9:06 AM. If you have any questions, ask your nurse or doctor.          STOP taking these medications    traZODone 50 MG tablet Commonly known as: DESYREL Stopped by: Turkey Ameliah Baskins       TAKE these medications    amLODipine 5 MG tablet Commonly  known as: NORVASC Take 1 tablet (5 mg total) by mouth daily.   apixaban 5 MG Tabs tablet Commonly known as: ELIQUIS Take 5 mg by mouth 2 (two) times daily.   busPIRone 5 MG tablet Commonly known as: BUSPAR Take 5 mg by mouth 3 (three) times daily.   donepezil 10 MG tablet Commonly known as: ARICEPT Take 1 tablet by mouth at bedtime.   hydrochlorothiazide 25 MG tablet Commonly known as: HYDRODIURIL Take 25 mg by mouth daily.   melatonin 3 MG Tabs tablet Take 3 mg by mouth at bedtime.   memantine 10 MG tablet Commonly known as: NAMENDA Take 10 mg by mouth daily. Until 10/26/22. Give One tablet by mouth two times daily. What changed: Another medication with the same name was removed. Continue taking this medication, and follow the directions you see here. Changed by: Earnestine Mealing   multivitamin with minerals tablet Take 1 tablet by mouth daily.   Potassium Chloride ER 20 MEQ Tbcr Take 1 tablet by mouth daily.   sertraline 100 MG tablet Commonly known as: ZOLOFT Take 100 mg by mouth daily.   Zinc Oxide 12.8 % ointment Commonly known as: TRIPLE PASTE Apply 1 Application topically as needed for irritation. Every shift.        Review of Systems  Immunization History  Administered Date(s) Administered  Influenza Inj Mdck Quad Pf 01/14/2019   Influenza,inj,Quad PF,6+ Mos 03/21/2020   Influenza-Unspecified 01/18/2016, 01/16/2017, 01/29/2018   Moderna Sars-Covid-2 Vaccination 12/01/2019, 12/29/2019   Td 10/26/2017   Td (Adult),5 Lf Tetanus Toxid, Preservative Free 10/26/2017   Td (Adult),unspecified 05/01/2008   Tdap 05/01/2008   Zoster, Live 04/28/2009   Pertinent  Health Maintenance Due  Topic Date Due   INFLUENZA VACCINE  11/27/2022      05/03/2022    8:10 AM 05/03/2022    8:00 PM 05/04/2022    8:35 AM 05/04/2022    8:00 PM 05/05/2022   11:00 AM  Fall Risk  (RETIRED) Patient Fall Risk Level High fall risk High fall risk High fall risk High fall risk High fall  risk   Functional Status Survey:    Vitals:   11/17/22 0859  BP: (!) 142/72  Pulse: (!) 59  Resp: 15  Temp: 98.3 F (36.8 C)  SpO2: 95%  Weight: 193 lb 12.8 oz (87.9 kg)  Height: 6' (1.829 m)   Body mass index is 26.28 kg/m. Physical Exam Constitutional:      Appearance: Normal appearance.     Comments: Spastic torticollis  Cardiovascular:     Rate and Rhythm: Normal rate.     Pulses: Normal pulses.  Pulmonary:     Effort: Pulmonary effort is normal.     Breath sounds: Normal breath sounds.  Abdominal:     General: Abdomen is flat.     Palpations: Abdomen is soft.  Neurological:     Mental Status: He is alert. Mental status is at baseline. He is disoriented.     Labs reviewed: Recent Labs    05/05/22 0515 08/03/22 1958 08/03/22 2230 08/04/22 0455 08/06/22 1018 08/12/22 0631 10/09/22 0000 10/16/22 0000  NA 138 139  --  139  --  140 140 138  K 3.5 3.2*  --  3.3*   < > 3.8 3.5 3.4*  CL 104 102  --  103  --  103 103 100  CO2 27 28  --  29  --  30 30* 33*  GLUCOSE 95 110*  --  94  --  99  --   --   BUN 25* 18  --  18  --  32* 22* 15  CREATININE 1.34* 1.12  --  0.98  --  1.17 1.3 1.2  CALCIUM 8.6* 8.5*  --  8.8*  --  9.0 8.9 8.9  MG 1.9  --  2.2  --   --  2.3  --   --    < > = values in this interval not displayed.   Recent Labs    04/28/22 1951 10/09/22 0000  AST 23 13*  ALT 15 12  ALKPHOS 73 81  BILITOT 0.7  --   PROT 7.0  --   ALBUMIN 3.3* 3.5   Recent Labs    04/28/22 1951 04/30/22 0557 08/03/22 1958 08/04/22 0455 08/09/22 0442 10/09/22 0000  WBC 8.0   < > 6.5 7.1 6.6 6.3  NEUTROABS 5.7  --   --   --   --   --   HGB 13.1   < > 11.6* 12.0* 12.2* 11.9*  HCT 40.7   < > 35.7* 37.1* 37.4* 37*  MCV 83.7   < > 84.8 84.3 83.5  --   PLT 237   < > 188 179 220 226   < > = values in this interval not displayed.   Lab Results  Component Value Date   TSH 1.511 08/04/2022   No results found for: "HGBA1C" No results found for: "CHOL", "HDL",  "LDLCALC", "LDLDIRECT", "TRIG", "CHOLHDL"  Significant Diagnostic Results in last 30 days:  No results found.  Assessment/Plan Dementia with behavioral disturbance (HCC)  Stage 3a chronic kidney disease (HCC)  Essential hypertension  Atrial fibrillation, chronic (HCC)  Recurrent major depressive disorder, in partial remission (HCC)  Malnutrition of moderate degree (HCC) Patient is in long term care for supportive care since he no longer has capacity to live alone or manage his medical care individually. Staff continue to have challenges with managing patient's behavioral changes. BP slightly elevated at this time. No signs of bleeding, continue eliquis. Currently taking aricept, memantine, sertraline, and buspar PRN. Patient previously had trazodone without improvement. If there is no improvement on buspar, will have to consider starting seroquel to help with patient's behavioral changes that have impacted his safety and the safety of caregivers. Continue amlodipine for BP and consider increase if BP is persistently elevated. Patient is at high risk of falls due to poor redirection and judgement given his stage of dementia. Patient is at a FAST stage 6b. Will need to continue goals of care conversations with family regarding interventions and his continued decline.   Family/ staff Communication: nursing  Labs/tests ordered:  none

## 2022-11-19 ENCOUNTER — Encounter: Payer: Self-pay | Admitting: Student

## 2022-11-19 NOTE — Progress Notes (Incomplete)
Location:   [facility] Room Number: 414 A Place of Service:  SNF [phone]) Provider:  Earnestine Mealing, MD  Earnestine Mealing, MD  Patient Care Team: Earnestine Mealing, MD as PCP - General (Family Medicine)  Extended Emergency Contact Information Primary Emergency Contact: Merry Proud Address: [address]          [place], [place] [address] Home Phone: [phone] Mobile Phone: [phone] Relation: Spouse Secondary Emergency Contact: Kabir,[name] Mobile Phone: [phone] Relation: Son  Code Status:  FULL CODE Goals of care: Advanced Directive information    11/17/2022    9:01 AM  Advanced Directives  Does Patient Have a Medical Advance Directive? No     Chief Complaint  Patient presents with  . Medical Management of Chronic Issues    Routine follow up  . Immunizations    Pneumonia vaccine, shingles vaccine and COVID booster due.  . Quality Metric Gaps    Medicare annual wellness visit due.    HPI:  Jim Blake is a 87 y.o. male seen today for medical management of chronic diseases.    Patient gives his name. He says "I don't know" to date, year, birthday, month, location. He says, "what's all this about?"   Nursing has continued concern that patient has outbursts as well as high fall risk. He requires constant supervision and redirection in the afternoon.    Past Medical History:  Diagnosis Date  . Atrial fibrillation (HCC)    persistent   on NOAC  . Hypertension   . Vitamin B 12 deficiency    History reviewed. No pertinent surgical history.  No Known Allergies  Allergies as of 11/17/2022   No Known Allergies      Medication List        Accurate as of November 17, 2022  9:06 AM. If you have any questions, ask your nurse or doctor.          STOP taking these medications    traZODone 50 MG tablet Commonly known as: DESYREL Stopped by: Turkey Mariadelosang Wynns       TAKE these medications    amLODipine 5 MG  tablet Commonly known as: NORVASC Take 1 tablet (5 mg total) by mouth daily.   apixaban 5 MG Tabs tablet Commonly known as: ELIQUIS Take 5 mg by mouth 2 (two) times daily.   busPIRone 5 MG tablet Commonly known as: BUSPAR Take 5 mg by mouth 3 (three) times daily.   donepezil 10 MG tablet Commonly known as: ARICEPT Take 1 tablet by mouth at bedtime.   hydrochlorothiazide 25 MG tablet Commonly known as: HYDRODIURIL Take 25 mg by mouth daily.   melatonin 3 MG Tabs tablet Take 3 mg by mouth at bedtime.   memantine 10 MG tablet Commonly known as: NAMENDA Take 10 mg by mouth daily. Until 10/26/22. Give One tablet by mouth two times daily. What changed: Another medication with the same name was removed. Continue taking this medication, and follow the directions you see here. Changed by: Earnestine Mealing   multivitamin with minerals tablet Take 1 tablet by mouth daily.   Potassium Chloride ER 20 MEQ Tbcr Take 1 tablet by mouth daily.   sertraline 100 MG tablet Commonly known as: ZOLOFT Take 100 mg by mouth daily.   Zinc Oxide 12.8 % ointment Commonly known as: TRIPLE PASTE Apply 1 Application topically as needed for irritation. Every shift.        Review of Systems  Immunization History  Administered Date(s)  Administered  . Influenza Inj Mdck Quad Pf 01/14/2019  . Influenza,inj,Quad PF,6+ Mos 03/21/2020  . Influenza-Unspecified 01/18/2016, 01/16/2017, 01/29/2018  . Moderna Sars-Covid-2 Vaccination 12/01/2019, 12/29/2019  . Td 10/26/2017  . Td (Adult),5 Lf Tetanus Toxid, Preservative Free 10/26/2017  . Td (Adult),unspecified 05/01/2008  . Tdap 05/01/2008  . Zoster, Live 04/28/2009   Pertinent  Health Maintenance Due  Topic Date Due  . INFLUENZA VACCINE  11/27/2022      05/03/2022    8:10 AM 05/03/2022    8:00 PM 05/04/2022    8:35 AM 05/04/2022    8:00 PM 05/05/2022   11:00 AM  Fall Risk  (RETIRED) Patient Fall Risk Level High fall risk High fall risk High fall  risk High fall risk High fall risk   Functional Status Survey:    Vitals:   11/17/22 0859  BP: (!) 142/72  Pulse: (!) 59  Resp: 15  Temp: 98.3 F (36.8 C)  SpO2: 95%  Weight: 193 lb 12.8 oz (87.9 kg)  Height: 6' (1.829 m)   Body mass index is 26.28 kg/m. Physical Exam Vitals reviewed.  Constitutional:      Appearance: Normal appearance.  Cardiovascular:     Rate and Rhythm: Normal rate.  Pulmonary:     Effort: Pulmonary effort is normal.  Abdominal:     General: Abdomen is flat.  Neurological:     Mental Status: He is alert. Mental status is at baseline. He is disoriented.     Labs reviewed: Recent Labs    05/05/22 0515 08/03/22 1958 08/03/22 2230 08/04/22 0455 08/06/22 1018 08/12/22 0631 10/09/22 0000 10/16/22 0000  NA 138 139  --  139  --  140 140 138  K 3.5 3.2*  --  3.3*   < > 3.8 3.5 3.4*  CL 104 102  --  103  --  103 103 100  CO2 27 28  --  29  --  30 30* 33*  GLUCOSE 95 110*  --  94  --  99  --   --   BUN 25* 18  --  18  --  32* 22* 15  CREATININE 1.34* 1.12  --  0.98  --  1.17 1.3 1.2  CALCIUM 8.6* 8.5*  --  8.8*  --  9.0 8.9 8.9  MG 1.9  --  2.2  --   --  2.3  --   --    < > = values in this interval not displayed.   Recent Labs    04/28/22 1951 10/09/22 0000  AST 23 13*  ALT 15 12  ALKPHOS 73 81  BILITOT 0.7  --   PROT 7.0  --   ALBUMIN 3.3* 3.5   Recent Labs    04/28/22 1951 04/30/22 0557 08/03/22 1958 08/04/22 0455 08/09/22 0442 10/09/22 0000  WBC 8.0   < > 6.5 7.1 6.6 6.3  NEUTROABS 5.7  --   --   --   --   --   HGB 13.1   < > 11.6* 12.0* 12.2* 11.9*  HCT 40.7   < > 35.7* 37.1* 37.4* 37*  MCV 83.7   < > 84.8 84.3 83.5  --   PLT 237   < > 188 179 220 226   < > = values in this interval not displayed.   Lab Results  Component Value Date   TSH 1.511 08/04/2022   No results found for: "HGBA1C" No results found for: "CHOL", "HDL", "LDLCALC", "LDLDIRECT", "TRIG", "CHOLHDL"  Significant Diagnostic Results in last 30 days:   No results found.  Assessment/Plan Dementia with behavioral disturbance (HCC)  Stage 3a chronic kidney disease (HCC)  Essential hypertension  Atrial fibrillation, chronic (HCC)  Recurrent major depressive disorder, in partial remission (HCC)  Malnutrition of moderate degree (HCC) Nursing periodical  Family/ staff Communication:   Labs/tests ordered:

## 2022-11-24 LAB — BASIC METABOLIC PANEL: Potassium: 3.7 mEq/L (ref 3.5–5.1)

## 2022-12-08 ENCOUNTER — Non-Acute Institutional Stay (SKILLED_NURSING_FACILITY): Payer: Medicare Other | Admitting: Student

## 2022-12-08 ENCOUNTER — Encounter: Payer: Self-pay | Admitting: Student

## 2022-12-08 DIAGNOSIS — I482 Chronic atrial fibrillation, unspecified: Secondary | ICD-10-CM

## 2022-12-08 DIAGNOSIS — F03918 Unspecified dementia, unspecified severity, with other behavioral disturbance: Secondary | ICD-10-CM | POA: Diagnosis not present

## 2022-12-08 DIAGNOSIS — N1831 Chronic kidney disease, stage 3a: Secondary | ICD-10-CM | POA: Diagnosis not present

## 2022-12-08 DIAGNOSIS — F3341 Major depressive disorder, recurrent, in partial remission: Secondary | ICD-10-CM | POA: Diagnosis not present

## 2022-12-08 DIAGNOSIS — N401 Enlarged prostate with lower urinary tract symptoms: Secondary | ICD-10-CM

## 2022-12-08 DIAGNOSIS — R350 Frequency of micturition: Secondary | ICD-10-CM

## 2022-12-08 NOTE — Progress Notes (Unsigned)
Location:   [facility] Room Number: 414 A Place of Service:  SNF [phone]) Provider:  Earnestine Mealing, MD  Earnestine Mealing, MD  Patient Care Team: Earnestine Mealing, MD as PCP - General (Family Medicine)  Extended Emergency Contact Information Primary Emergency Contact: Merry Proud Address: [address]          [place], [place] [address] Home Phone: [phone] Mobile Phone: [phone] Relation: Spouse Secondary Emergency Contact: Tory,[name] Mobile Phone: [phone] Relation: Son  Code Status:  FULL CODE Goals of care: Advanced Directive information    12/08/2022   11:41 AM  Advanced Directives  Does Patient Have a Medical Advance Directive? No     Chief Complaint  Patient presents with   Medical Management of Chronic Issues    Routine follow up   Immunizations    Pneumonia vaccine, shingrix vaccine, COVID booster and flu vaccine due.   Quality Metric Gaps    Medicare annual wellness    HPI:  Pt is a 87 y.o. male seen today for medical management of chronic diseases.    Patient has had hypersexual behavior over the last few weeks. Touched a staff member on the leg and has been saying sexual things. Patient does not recall with questioning given his current state of dementia.   Nursing would like medication to aid with this. He has failed trials of trazodone, buspar, sertraline.   Past Medical History:  Diagnosis Date   Atrial fibrillation (HCC)    persistent   on NOAC   Hypertension    Vitamin B 12 deficiency    History reviewed. No pertinent surgical history.  No Known Allergies  Allergies as of 12/08/2022   No Known Allergies      Medication List        Accurate as of December 08, 2022 11:59 PM. If you have any questions, ask your nurse or doctor.          amLODipine 5 MG tablet Commonly known as: NORVASC Take 1 tablet (5 mg total) by mouth daily.   apixaban 5 MG Tabs tablet Commonly known as:  ELIQUIS Take 5 mg by mouth 2 (two) times daily.   busPIRone 5 MG tablet Commonly known as: BUSPAR Take 5 mg by mouth 3 (three) times daily.   donepezil 10 MG tablet Commonly known as: ARICEPT Take 1 tablet by mouth at bedtime.   hydrochlorothiazide 25 MG tablet Commonly known as: HYDRODIURIL Take 25 mg by mouth daily.   melatonin 3 MG Tabs tablet Take 3 mg by mouth at bedtime.   memantine 10 MG tablet Commonly known as: NAMENDA Take 10 mg by mouth daily. Until 10/26/22. Give One tablet by mouth two times daily.   multivitamin with minerals tablet Take 1 tablet by mouth daily.   Potassium Chloride ER 20 MEQ Tbcr Take 1 tablet by mouth daily.   sertraline 100 MG tablet Commonly known as: ZOLOFT Take 100 mg by mouth daily.   tamsulosin 0.4 MG Caps capsule Commonly known as: FLOMAX Take 0.4 mg by mouth.   Zinc Oxide 12.8 % ointment Commonly known as: TRIPLE PASTE Apply 1 Application topically as needed for irritation. Every shift.        Review of Systems  Immunization History  Administered Date(s) Administered   Influenza Inj Mdck Quad Pf 01/14/2019   Influenza,inj,Quad PF,6+ Mos 03/21/2020   Influenza-Unspecified 01/18/2016, 01/16/2017, 01/29/2018   Moderna Sars-Covid-2 Vaccination 12/01/2019, 12/29/2019   Td 10/26/2017   Td (  Adult),5 Lf Tetanus Toxid, Preservative Free 10/26/2017   Td (Adult),unspecified 05/01/2008   Tdap 05/01/2008   Zoster, Live 04/28/2009   Pertinent  Health Maintenance Due  Topic Date Due   INFLUENZA VACCINE  11/27/2022      05/03/2022    8:10 AM 05/03/2022    8:00 PM 05/04/2022    8:35 AM 05/04/2022    8:00 PM 05/05/2022   11:00 AM  Fall Risk  (RETIRED) Patient Fall Risk Level High fall risk High fall risk High fall risk High fall risk High fall risk   Functional Status Survey:    Vitals:   12/08/22 1139 12/08/22 1156  BP: (!) 151/75 (!) 166/72  Pulse: (!) 59   Resp: 18   Temp: 97.9 F (36.6 C)   SpO2: 98%   Weight: 200 lb  3.2 oz (90.8 kg)   Height: 6' (1.829 m)    Body mass index is 27.15 kg/m. Physical Exam Cardiovascular:     Rate and Rhythm: Normal rate.     Pulses: Normal pulses.  Neurological:     Mental Status: He is alert. Mental status is at baseline. He is disoriented.     Labs reviewed: Recent Labs    05/05/22 0515 08/03/22 1958 08/03/22 2230 08/04/22 0455 08/06/22 1018 08/12/22 0631 10/09/22 0000 10/16/22 0000  NA 138 139  --  139  --  140 140 138  K 3.5 3.2*  --  3.3*   < > 3.8 3.5 3.4*  CL 104 102  --  103  --  103 103 100  CO2 27 28  --  29  --  30 30* 33*  GLUCOSE 95 110*  --  94  --  99  --   --   BUN 25* 18  --  18  --  32* 22* 15  CREATININE 1.34* 1.12  --  0.98  --  1.17 1.3 1.2  CALCIUM 8.6* 8.5*  --  8.8*  --  9.0 8.9 8.9  MG 1.9  --  2.2  --   --  2.3  --   --    < > = values in this interval not displayed.   Recent Labs    04/28/22 1951 10/09/22 0000  AST 23 13*  ALT 15 12  ALKPHOS 73 81  BILITOT 0.7  --   PROT 7.0  --   ALBUMIN 3.3* 3.5   Recent Labs    04/28/22 1951 04/30/22 0557 08/03/22 1958 08/04/22 0455 08/09/22 0442 10/09/22 0000  WBC 8.0   < > 6.5 7.1 6.6 6.3  NEUTROABS 5.7  --   --   --   --   --   HGB 13.1   < > 11.6* 12.0* 12.2* 11.9*  HCT 40.7   < > 35.7* 37.1* 37.4* 37*  MCV 83.7   < > 84.8 84.3 83.5  --   PLT 237   < > 188 179 220 226   < > = values in this interval not displayed.   Lab Results  Component Value Date   TSH 1.511 08/04/2022   No results found for: "HGBA1C" No results found for: "CHOL", "HDL", "LDLCALC", "LDLDIRECT", "TRIG", "CHOLHDL"  Significant Diagnostic Results in last 30 days:  No results found.  Assessment/Plan Dementia with behavioral disturbance (HCC)  Stage 3a chronic kidney disease (HCC)  Recurrent major depressive disorder, in partial remission (HCC)  Atrial fibrillation, chronic (HCC)  Benign prostatic hyperplasia with urinary frequency Patient with dementia and behavioral  concerns which  have progressed -- periodic harm to staff, hypersexualization. Failed previous trials of treatment and redirection. Will start abilify 2mg  daily. Patient has had frequency of urine, but denies dysuria. Will trial start of flomax nightly.   Family/ staff Communication: Nursing, will notify family. There are other unrelated non-urgent complaints, but due to the busy schedule and the amount of time I've already spent with him, time does not permit me to address these routine issues at today's visit. I've requested another appointment to review these additional issues.  Labs/tests ordered:  none.   Coralyn Helling, MD, [facility] [phone]

## 2022-12-11 ENCOUNTER — Encounter: Payer: Self-pay | Admitting: Student

## 2022-12-15 ENCOUNTER — Non-Acute Institutional Stay (SKILLED_NURSING_FACILITY): Payer: Medicare Other | Admitting: Student

## 2022-12-15 ENCOUNTER — Encounter: Payer: Self-pay | Admitting: Student

## 2022-12-15 DIAGNOSIS — U071 COVID-19: Secondary | ICD-10-CM

## 2022-12-15 NOTE — Progress Notes (Unsigned)
Location:  [facility] [place].  Nursing Home Room Number: [facility] Place of Service:  SNF [phone]) Provider:  Earnestine Mealing, MD  Patient Care Team: Earnestine Mealing, MD as PCP - General [facility] Medicine)  Extended Emergency Contact Information Primary Emergency Contact: Merry Proud Address: [address]          [place], [place] [address] Home Phone: [phone] Mobile Phone: [phone] Relation: Spouse Secondary Emergency Contact: Swire,[name] Mobile Phone: [phone] Relation: Son  Code Status:  Full Code.  Goals of care: Advanced Directive information    12/15/2022   10:40 AM  Advanced Directives  Does Patient Have a Medical Advance Directive? No  Would patient like information on creating a medical advance directive? No - Patient declined     Chief Complaint  Patient presents with   Acute Visit    Positive Covid.     HPI:  Pt is a 87 y.o. male seen today for an acute visit for Positive Covid.   Patient states he feels bad. Trying to get out of bed on his own.   Nursing concerned patient will struggle with quarantine. Vital signs stable at this time.    Past Medical History:  Diagnosis Date   Atrial fibrillation (HCC)    persistent   on NOAC   Hypertension    Vitamin B 12 deficiency    History reviewed. No pertinent surgical history.  No Known Allergies  Outpatient Encounter Medications as of 12/15/2022  Medication Sig   amLODipine (NORVASC) 5 MG tablet Take 1 tablet (5 mg total) by mouth daily.   apixaban (ELIQUIS) 5 MG TABS tablet Take 5 mg by mouth 2 (two) times daily.   ARIPiprazole (ABILIFY) 2 MG tablet Take 2 mg by mouth daily.   busPIRone (BUSPAR) 5 MG tablet Take 5 mg by mouth 3 (three) times daily.   donepezil (ARICEPT) 10 MG tablet Take 1 tablet by mouth at bedtime.   hydrochlorothiazide (HYDRODIURIL) 25 MG tablet Take 25 mg by mouth daily.   melatonin 3 MG TABS tablet Take 3 mg by mouth at bedtime.   memantine (NAMENDA) 10 MG  tablet Take 10 mg by mouth daily. Until 10/26/22. Give One tablet by mouth two times daily.   Multiple Vitamins-Minerals (MULTIVITAMIN WITH MINERALS) tablet Take 1 tablet by mouth daily.   Potassium Chloride ER 20 MEQ TBCR Take 1 tablet by mouth daily.   sertraline (ZOLOFT) 100 MG tablet Take 100 mg by mouth daily.   tamsulosin (FLOMAX) 0.4 MG CAPS capsule Take 0.4 mg by mouth.   Zinc Oxide (TRIPLE PASTE) 12.8 % ointment Apply 1 Application topically as needed for irritation. Every shift.   No facility-administered encounter medications on file as of 12/15/2022.    Review of Systems  Immunization History  Administered Date(s) Administered   Influenza Inj Mdck Quad Pf 01/14/2019   Influenza,inj,Quad PF,6+ Mos 03/21/2020   Influenza-Unspecified 01/18/2016, 01/16/2017, 01/29/2018   Moderna Sars-Covid-2 Vaccination 12/01/2019, 12/29/2019   Td 10/26/2017   Td (Adult),5 Lf Tetanus Toxid, Preservative Free 10/26/2017   Td (Adult),unspecified 05/01/2008   Tdap 05/01/2008   Zoster, Live 04/28/2009   Pertinent  Health Maintenance Due  Topic Date Due   INFLUENZA VACCINE  11/27/2022      05/03/2022    8:10 AM 05/03/2022    8:00 PM 05/04/2022    8:35 AM 05/04/2022    8:00 PM 05/05/2022   11:00 AM  Fall Risk  (RETIRED) Patient Fall Risk Level High fall risk High fall  risk High fall risk High fall risk High fall risk   Functional Status Survey:    Vitals:   12/15/22 1036 12/15/22 1041  BP: (!) 152/71 (!) 151/75  Pulse: 76   Resp: 18   Temp: 97.9 F (36.6 C)   SpO2: 98%   Weight: 200 lb 3.2 oz (90.8 kg)   Height: 6' (1.829 m)    Body mass index is 27.15 kg/m. Physical Exam Constitutional:      General: He is not in acute distress.    Appearance: He is ill-appearing.     Comments: Lying in bed eyes closed  Cardiovascular:     Rate and Rhythm: Normal rate and regular rhythm.  Pulmonary:     Effort: Pulmonary effort is normal.     Breath sounds: Normal breath sounds.  Abdominal:      General: Abdomen is flat.     Palpations: Abdomen is soft.  Neurological:     Mental Status: He is alert. Mental status is at baseline. He is disoriented.     Labs reviewed: Recent Labs    05/05/22 0515 08/03/22 1958 08/03/22 2230 08/04/22 0455 08/06/22 1018 08/12/22 0631 10/09/22 0000 10/16/22 0000 11/24/22 0000  NA 138 139  --  139  --  140 140 138  --   K 3.5 3.2*  --  3.3*   < > 3.8 3.5 3.4* 3.7  CL 104 102  --  103  --  103 103 100  --   CO2 27 28  --  29  --  30 30* 33*  --   GLUCOSE 95 110*  --  94  --  99  --   --   --   BUN 25* 18  --  18  --  32* 22* 15  --   CREATININE 1.34* 1.12  --  0.98  --  1.17 1.3 1.2  --   CALCIUM 8.6* 8.5*  --  8.8*  --  9.0 8.9 8.9  --   MG 1.9  --  2.2  --   --  2.3  --   --   --    < > = values in this interval not displayed.   Recent Labs    04/28/22 1951 10/09/22 0000  AST 23 13*  ALT 15 12  ALKPHOS 73 81  BILITOT 0.7  --   PROT 7.0  --   ALBUMIN 3.3* 3.5   Recent Labs    04/28/22 1951 04/30/22 0557 08/03/22 1958 08/04/22 0455 08/09/22 0442 10/09/22 0000  WBC 8.0   < > 6.5 7.1 6.6 6.3  NEUTROABS 5.7  --   --   --   --   --   HGB 13.1   < > 11.6* 12.0* 12.2* 11.9*  HCT 40.7   < > 35.7* 37.1* 37.4* 37*  MCV 83.7   < > 84.8 84.3 83.5  --   PLT 237   < > 188 179 220 226   < > = values in this interval not displayed.   Lab Results  Component Value Date   TSH 1.511 08/04/2022   No results found for: "HGBA1C" No results found for: "CHOL", "HDL", "LDLCALC", "LDLDIRECT", "TRIG", "CHOLHDL"  Significant Diagnostic Results in last 30 days:  No results found.  Assessment/Plan COVID-19 virus infection Patient has COVID 19 infection. Start Molnirpavir 800 mg BID x5 days given numerous interactions with current meds to have Paxlovid. Q shift vital signs.   Family/ staff Communication:  nursing who updated family  Labs/tests ordered:  none

## 2022-12-16 ENCOUNTER — Encounter: Payer: Self-pay | Admitting: Student

## 2023-01-01 ENCOUNTER — Non-Acute Institutional Stay (SKILLED_NURSING_FACILITY): Payer: Medicare Other | Admitting: Nurse Practitioner

## 2023-01-01 ENCOUNTER — Encounter: Payer: Self-pay | Admitting: Nurse Practitioner

## 2023-01-01 DIAGNOSIS — R600 Localized edema: Secondary | ICD-10-CM | POA: Diagnosis not present

## 2023-01-01 NOTE — Progress Notes (Signed)
Location:  [facility] [place].  Nursing Home Room Number: [facility] [address] Place of Service:  SNF [phone]) Abbey Chatters, NP  PCP: Earnestine Mealing, MD  Patient Care Team: Earnestine Mealing, MD as PCP - General [facility] Medicine)  Extended Emergency Contact Information Primary Emergency Contact: Merry Proud Address: [address]          [place], [place] [address] Home Phone: [phone] Mobile Phone: [phone] Relation: Spouse Secondary Emergency Contact: Corcoran,[name] Mobile Phone: [phone] Relation: Son  Goals of care: Advanced Directive information    01/01/2023    9:54 AM  Advanced Directives  Does Patient Have a Medical Advance Directive? No  Would patient like information on creating a medical advance directive? No - Patient declined     Chief Complaint  Patient presents with   Acute Visit    Ankle Swelling.     HPI:  Pt is a 87 y.o. male seen today for an acute visit for Ankle Swelling.  Nursing writes note to see patient about ankle swelling.  Pt denies any pain, tenderness to ankles No cough, congestion noted.     Past Medical History:  Diagnosis Date   Atrial fibrillation (HCC)    persistent   on NOAC   Hypertension    Vitamin B 12 deficiency    History reviewed. No pertinent surgical history.  No Known Allergies  Outpatient Encounter Medications as of 01/01/2023  Medication Sig   acetaminophen (TYLENOL) 500 MG tablet Take 1,000 mg by mouth every 8 (eight) hours as needed.   amLODipine (NORVASC) 5 MG tablet Take 1 tablet (5 mg total) by mouth daily.   apixaban (ELIQUIS) 5 MG TABS tablet Take 5 mg by mouth 2 (two) times daily.   ARIPiprazole (ABILIFY) 10 MG tablet Take 10 mg by mouth daily.   busPIRone (BUSPAR) 5 MG tablet Take 5 mg by mouth 3 (three) times daily.   donepezil (ARICEPT) 10 MG tablet Take 1 tablet by mouth at bedtime.   hydrochlorothiazide (HYDRODIURIL) 25 MG tablet Take 25 mg by mouth daily.   melatonin 3 MG TABS tablet  Take 3 mg by mouth at bedtime.   memantine (NAMENDA) 10 MG tablet Take 10 mg by mouth daily. Until 10/26/22. Give One tablet by mouth two times daily.   Multiple Vitamins-Minerals (MULTIVITAMIN WITH MINERALS) tablet Take 1 tablet by mouth daily.   Potassium Chloride ER 20 MEQ TBCR Take 1 tablet by mouth daily.   sertraline (ZOLOFT) 100 MG tablet Take 100 mg by mouth daily.   tamsulosin (FLOMAX) 0.4 MG CAPS capsule Take 0.4 mg by mouth.   Zinc Oxide (TRIPLE PASTE) 12.8 % ointment Apply 1 Application topically as needed for irritation. Every shift.   [DISCONTINUED] ARIPiprazole (ABILIFY) 2 MG tablet Take 2 mg by mouth daily.   No facility-administered encounter medications on file as of 01/01/2023.    Review of Systems  Unable to perform ROS: Dementia    Immunization History  Administered Date(s) Administered   Influenza Inj Mdck Quad Pf 01/14/2019   Influenza,inj,Quad PF,6+ Mos 03/21/2020   Influenza-Unspecified 01/18/2016, 01/16/2017, 01/29/2018   Moderna Sars-Covid-2 Vaccination 12/01/2019, 12/29/2019   Td 10/26/2017   Td (Adult),5 Lf Tetanus Toxid, Preservative Free 10/26/2017   Td (Adult),unspecified 05/01/2008   Tdap 05/01/2008   Zoster, Live 04/28/2009   Pertinent  Health Maintenance Due  Topic Date Due   INFLUENZA VACCINE  11/27/2022      05/03/2022    8:10 AM 05/03/2022    8:00 PM 05/04/2022  8:35 AM 05/04/2022    8:00 PM 05/05/2022   11:00 AM  Fall Risk  (RETIRED) Patient Fall Risk Level High fall risk High fall risk High fall risk High fall risk High fall risk   Functional Status Survey:    Vitals:   01/01/23 0949 01/01/23 0956  BP: (!) 158/63 130/66  Pulse: 71   Resp: 18   Temp: (!) 97 F (36.1 C)   SpO2: 94%   Weight: 211 lb 3.2 oz (95.8 kg)   Height: 6' (1.829 m)    Body mass index is 28.64 kg/m. Physical Exam Constitutional:      General: He is not in acute distress.    Appearance: He is well-developed. He is not diaphoretic.  HENT:     Head:  Normocephalic and atraumatic.     Right Ear: External ear normal.     Left Ear: External ear normal.     Mouth/Throat:     Pharynx: No oropharyngeal exudate.  Eyes:     Conjunctiva/sclera: Conjunctivae normal.     Pupils: Pupils are equal, round, and reactive to light.  Cardiovascular:     Rate and Rhythm: Normal rate and regular rhythm.     Heart sounds: Normal heart sounds.  Pulmonary:     Effort: Pulmonary effort is normal.     Breath sounds: Normal breath sounds.  Abdominal:     General: Bowel sounds are normal.     Palpations: Abdomen is soft.  Musculoskeletal:        General: No tenderness.     Cervical back: Normal range of motion and neck supple.     Comments: Very minimal swelling to bilateral ankles No tenderness or bruising on exam.   Skin:    General: Skin is warm and dry.  Neurological:     Mental Status: He is alert and oriented to person, place, and time.     Labs reviewed: Recent Labs    05/05/22 0515 08/03/22 1958 08/03/22 2230 08/04/22 0455 08/06/22 1018 08/12/22 0631 10/09/22 0000 10/16/22 0000 11/24/22 0000  NA 138 139  --  139  --  140 140 138  --   K 3.5 3.2*  --  3.3*   < > 3.8 3.5 3.4* 3.7  CL 104 102  --  103  --  103 103 100  --   CO2 27 28  --  29  --  30 30* 33*  --   GLUCOSE 95 110*  --  94  --  99  --   --   --   BUN 25* 18  --  18  --  32* 22* 15  --   CREATININE 1.34* 1.12  --  0.98  --  1.17 1.3 1.2  --   CALCIUM 8.6* 8.5*  --  8.8*  --  9.0 8.9 8.9  --   MG 1.9  --  2.2  --   --  2.3  --   --   --    < > = values in this interval not displayed.   Recent Labs    04/28/22 1951 10/09/22 0000  AST 23 13*  ALT 15 12  ALKPHOS 73 81  BILITOT 0.7  --   PROT 7.0  --   ALBUMIN 3.3* 3.5   Recent Labs    04/28/22 1951 04/30/22 0557 08/03/22 1958 08/04/22 0455 08/09/22 0442 10/09/22 0000  WBC 8.0   < > 6.5 7.1 6.6 6.3  NEUTROABS 5.7  --   --   --   --   --  HGB 13.1   < > 11.6* 12.0* 12.2* 11.9*  HCT 40.7   < > 35.7*  37.1* 37.4* 37*  MCV 83.7   < > 84.8 84.3 83.5  --   PLT 237   < > 188 179 220 226   < > = values in this interval not displayed.   Lab Results  Component Value Date   TSH 1.511 08/04/2022   No results found for: "HGBA1C" No results found for: "CHOL", "HDL", "LDLCALC", "LDLDIRECT", "TRIG", "CHOLHDL"  Significant Diagnostic Results in last 30 days:  No results found.  Assessment/Plan  1. Bilateral leg edema  Suspect due to chronic venous insufficiency  Without significant LE edema noted today on exam encouraged to elevate legs above level of heart as tolerates low sodium diet Will have staff apply compression hose (on in am, off in pm) Monitor and notify if symptoms worsen.    Janene Harvey. [facility] [phone]

## 2023-01-08 ENCOUNTER — Non-Acute Institutional Stay (SKILLED_NURSING_FACILITY): Payer: Medicare Other | Admitting: Nurse Practitioner

## 2023-01-08 ENCOUNTER — Encounter: Payer: Self-pay | Admitting: Nurse Practitioner

## 2023-01-08 DIAGNOSIS — R350 Frequency of micturition: Secondary | ICD-10-CM | POA: Diagnosis not present

## 2023-01-08 DIAGNOSIS — N401 Enlarged prostate with lower urinary tract symptoms: Secondary | ICD-10-CM | POA: Diagnosis not present

## 2023-01-08 NOTE — Progress Notes (Signed)
Location:  [facility] [place].  Nursing Home Room Number: [facility] [address] Place of Service:  SNF [phone]) Abbey Chatters, NP  PCP: Earnestine Mealing, MD  Patient Care Team: Earnestine Mealing, MD as PCP - General [facility] Medicine)  Extended Emergency Contact Information Primary Emergency Contact: Merry Proud Address: [address]          [place], [place] [address] Home Phone: [phone] Mobile Phone: [phone] Relation: Spouse Secondary Emergency Contact: Harshbarger,[name] Mobile Phone: [phone] Relation: Son  Goals of care: Advanced Directive information    01/08/2023   10:31 AM  Advanced Directives  Does Patient Have a Medical Advance Directive? No  Would patient like information on creating a medical advance directive? No - Patient declined     Chief Complaint  Patient presents with   Acute Visit    Urination Issues.     HPI:  Pt is a 87 y.o. male seen today for an acute visit for Urination Issues.  Staff reporting over night patient was wanting to get up to the bathroom frequently but most of the time would not urinate eve though he felt like he needed to.  He generally starts around lunch until around supper asking to go to the bathroom frequently. Does not complain of pain with urination. No fever or chills  He was placed on flomax last month due to frequency.    Past Medical History:  Diagnosis Date   Atrial fibrillation (HCC)    persistent   on NOAC   Hypertension    Vitamin B 12 deficiency    History reviewed. No pertinent surgical history.  No Known Allergies  Outpatient Encounter Medications as of 01/08/2023  Medication Sig   acetaminophen (TYLENOL) 500 MG tablet Take 1,000 mg by mouth every 8 (eight) hours as needed.   amLODipine (NORVASC) 5 MG tablet Take 1 tablet (5 mg total) by mouth daily.   apixaban (ELIQUIS) 5 MG TABS tablet Take 5 mg by mouth 2 (two) times daily.   ARIPiprazole (ABILIFY) 10 MG tablet Take 10 mg by mouth daily.    busPIRone (BUSPAR) 5 MG tablet Take 5 mg by mouth 3 (three) times daily.   donepezil (ARICEPT) 10 MG tablet Take 1 tablet by mouth at bedtime.   hydrochlorothiazide (HYDRODIURIL) 25 MG tablet Take 25 mg by mouth daily.   melatonin 3 MG TABS tablet Take 3 mg by mouth at bedtime.   memantine (NAMENDA) 10 MG tablet Take 10 mg by mouth daily. Until 10/26/22. Give One tablet by mouth two times daily.   Multiple Vitamins-Minerals (MULTIVITAMIN WITH MINERALS) tablet Take 1 tablet by mouth daily.   Potassium Chloride ER 20 MEQ TBCR Take 1 tablet by mouth daily.   sertraline (ZOLOFT) 100 MG tablet Take 100 mg by mouth daily.   tamsulosin (FLOMAX) 0.4 MG CAPS capsule Take 0.4 mg by mouth.   Zinc Oxide (TRIPLE PASTE) 12.8 % ointment Apply 1 Application topically as needed for irritation. Every shift.   No facility-administered encounter medications on file as of 01/08/2023.    Review of Systems  Unable to perform ROS: Dementia    Immunization History  Administered Date(s) Administered   Influenza Inj Mdck Quad Pf 01/14/2019   Influenza,inj,Quad PF,6+ Mos 03/21/2020   Influenza-Unspecified 01/18/2016, 01/16/2017, 01/29/2018   Moderna Sars-Covid-2 Vaccination 12/01/2019, 12/29/2019   Td 10/26/2017   Td (Adult),5 Lf Tetanus Toxid, Preservative Free 10/26/2017   Td (Adult),unspecified 05/01/2008   Tdap 05/01/2008   Zoster, Live 04/28/2009   Pertinent  Health Maintenance Due  Topic Date Due   INFLUENZA VACCINE  11/27/2022      05/03/2022    8:10 AM 05/03/2022    8:00 PM 05/04/2022    8:35 AM 05/04/2022    8:00 PM 05/05/2022   11:00 AM  Fall Risk  (RETIRED) Patient Fall Risk Level High fall risk High fall risk High fall risk High fall risk High fall risk   Functional Status Survey:    Vitals:   01/08/23 1027  BP: (!) 143/65  Pulse: (!) 55  Resp: 18  Temp: (!) 97 F (36.1 C)  SpO2: 94%  Weight: 211 lb 3.2 oz (95.8 kg)  Height: 6' (1.829 m)   Body mass index is 28.64 kg/m. Physical  Exam Constitutional:      Appearance: Normal appearance.  Abdominal:     General: Abdomen is flat. There is no distension.     Palpations: Abdomen is soft.     Tenderness: There is no abdominal tenderness.  Neurological:     Mental Status: He is alert. Mental status is at baseline.     Motor: Weakness present.     Gait: Gait abnormal.  Psychiatric:        Mood and Affect: Mood normal.    Labs reviewed: Recent Labs    05/05/22 0515 08/03/22 1958 08/03/22 2230 08/04/22 0455 08/06/22 1018 08/12/22 0631 10/09/22 0000 10/16/22 0000 11/24/22 0000  NA 138 139  --  139  --  140 140 138  --   K 3.5 3.2*  --  3.3*   < > 3.8 3.5 3.4* 3.7  CL 104 102  --  103  --  103 103 100  --   CO2 27 28  --  29  --  30 30* 33*  --   GLUCOSE 95 110*  --  94  --  99  --   --   --   BUN 25* 18  --  18  --  32* 22* 15  --   CREATININE 1.34* 1.12  --  0.98  --  1.17 1.3 1.2  --   CALCIUM 8.6* 8.5*  --  8.8*  --  9.0 8.9 8.9  --   MG 1.9  --  2.2  --   --  2.3  --   --   --    < > = values in this interval not displayed.   Recent Labs    04/28/22 1951 10/09/22 0000  AST 23 13*  ALT 15 12  ALKPHOS 73 81  BILITOT 0.7  --   PROT 7.0  --   ALBUMIN 3.3* 3.5   Recent Labs    04/28/22 1951 04/30/22 0557 08/03/22 1958 08/04/22 0455 08/09/22 0442 10/09/22 0000  WBC 8.0   < > 6.5 7.1 6.6 6.3  NEUTROABS 5.7  --   --   --   --   --   HGB 13.1   < > 11.6* 12.0* 12.2* 11.9*  HCT 40.7   < > 35.7* 37.1* 37.4* 37*  MCV 83.7   < > 84.8 84.3 83.5  --   PLT 237   < > 188 179 220 226   < > = values in this interval not displayed.   Lab Results  Component Value Date   TSH 1.511 08/04/2022   No results found for: "HGBA1C" No results found for: "CHOL", "HDL", "LDLCALC", "LDLDIRECT", "TRIG", "CHOLHDL"  Significant Diagnostic Results in last 30 days:  No results  found.  Assessment/Plan 1. Benign prostatic hyperplasia with urinary frequency -continues on flomax  -will have staff bladder scan  after urination to see if he is retaining urine.  -may benefit from OAB medication.    Janene Harvey. [facility] [phone]

## 2023-01-20 ENCOUNTER — Encounter: Payer: Self-pay | Admitting: Nurse Practitioner

## 2023-01-20 NOTE — Progress Notes (Signed)
error 

## 2023-01-20 NOTE — Progress Notes (Signed)
This encounter was created in error - please disregard.

## 2023-01-21 ENCOUNTER — Non-Acute Institutional Stay (SKILLED_NURSING_FACILITY): Payer: Medicare Other | Admitting: Student

## 2023-01-21 ENCOUNTER — Encounter: Payer: Self-pay | Admitting: Student

## 2023-01-21 DIAGNOSIS — I10 Essential (primary) hypertension: Secondary | ICD-10-CM | POA: Diagnosis not present

## 2023-01-21 DIAGNOSIS — N401 Enlarged prostate with lower urinary tract symptoms: Secondary | ICD-10-CM

## 2023-01-21 DIAGNOSIS — F03918 Unspecified dementia, unspecified severity, with other behavioral disturbance: Secondary | ICD-10-CM | POA: Diagnosis not present

## 2023-01-21 DIAGNOSIS — F3341 Major depressive disorder, recurrent, in partial remission: Secondary | ICD-10-CM | POA: Diagnosis not present

## 2023-01-21 DIAGNOSIS — R296 Repeated falls: Secondary | ICD-10-CM

## 2023-01-21 DIAGNOSIS — R600 Localized edema: Secondary | ICD-10-CM | POA: Diagnosis not present

## 2023-01-21 DIAGNOSIS — I482 Chronic atrial fibrillation, unspecified: Secondary | ICD-10-CM

## 2023-01-21 DIAGNOSIS — R350 Frequency of micturition: Secondary | ICD-10-CM

## 2023-01-21 NOTE — Progress Notes (Unsigned)
Location:  Other [facility] [place].  Nursing Home Room Number: [facility] [address]A Place of Service:  SNF [phone]) Provider:  Earnestine Mealing, MD  Patient Care Team: Earnestine Mealing, MD as PCP - General [facility])  Extended Emergency Contact Information Primary Emergency Contact: Merry Proud Address: [address]          [place], [place] [address] Home Phone: [phone] Mobile Phone: [phone] Relation: Spouse Secondary Emergency Contact: Nill,[name] Mobile Phone: [phone] Relation: Son  Code Status:  Full Code Goals of care: Advanced Directive information    01/21/2023   10:20 AM  Advanced Directives  Does Patient Have a Medical Advance Directive? No  Would patient like information on creating a medical advance directive? No - Patient declined     Chief Complaint  Patient presents with   Medical Management of Chronic Issues    Medical Management of Chronic Issues.     HPI:  Pt is a 87 y.o. Blake seen today for medical management of chronic diseases.    Patient states he is doing well. Ate breakfast well. Has no pain at this time. TO his knowledge urinating well and having good bowel movements.   Nursing with concern that patient has had some decline as he cannot take full pills at this time, requesting all medications be crushed or solutions when possible. Will change potassium for solution as well as gemtesa.   Contacted son with concern that patient is showing signs of decline and at this time, he would have rigorous chest compressions if he were found without a pulse rather than passing peacefully in the facility. He states he doesn't want that for his dad neither would his mom or sister and they would like to update his code status to DNR.    Past Medical History:  Diagnosis Date   Atrial fibrillation (HCC)    persistent   on NOAC   Hypertension    Vitamin B 12 deficiency    History reviewed. No pertinent surgical history.  No Known  Allergies  Outpatient Encounter Medications as of 01/21/2023  Medication Sig   acetaminophen (TYLENOL) 500 MG tablet Take 1,000 mg by mouth every 8 (eight) hours as needed.   amLODipine (NORVASC) 10 MG tablet Take 10 mg by mouth daily.   apixaban (ELIQUIS) 5 MG TABS tablet Take 5 mg by mouth 2 (two) times daily.   ARIPiprazole (ABILIFY) 10 MG tablet Take 10 mg by mouth daily.   busPIRone (BUSPAR) 5 MG tablet Take 5 mg by mouth 3 (three) times daily.   donepezil (ARICEPT) 10 MG tablet Take 1 tablet by mouth at bedtime.   hydrochlorothiazide (HYDRODIURIL) 25 MG tablet Take 25 mg by mouth daily.   melatonin 3 MG TABS tablet Take 3 mg by mouth at bedtime.   memantine (NAMENDA) 10 MG tablet Take 10 mg by mouth 2 (two) times daily.   mirabegron ER (MYRBETRIQ) 25 MG TB24 tablet Take 25 mg by mouth daily.   Multiple Vitamins-Minerals (MULTIVITAMIN WITH MINERALS) tablet Take 1 tablet by mouth daily.   Potassium Chloride ER 20 MEQ TBCR Take 1 tablet by mouth daily.   sertraline (ZOLOFT) 100 MG tablet Take 100 mg by mouth daily.   tamsulosin (FLOMAX) 0.4 MG CAPS capsule Take 0.4 mg by mouth.   Zinc Oxide (TRIPLE PASTE) 12.8 % ointment Apply 1 Application topically as needed for irritation. Every shift.   [DISCONTINUED] amLODipine (NORVASC) 5 MG tablet Take 1 tablet (5 mg total) by mouth daily.  No facility-administered encounter medications on file as of 01/21/2023.    Review of Systems  Immunization History  Administered Date(s) Administered   Influenza Inj Mdck Quad Pf 01/14/2019   Influenza,inj,Quad PF,6+ Mos 03/21/2020   Influenza-Unspecified 01/18/2016, 01/16/2017, 01/29/2018   Moderna Sars-Covid-2 Vaccination 12/01/2019, 12/29/2019   Td 10/26/2017   Td (Adult),5 Lf Tetanus Toxid, Preservative Free 10/26/2017   Td (Adult),unspecified 05/01/2008   Tdap 05/01/2008   Zoster, Live 04/28/2009   Pertinent  Health Maintenance Due  Topic Date Due   INFLUENZA VACCINE  11/27/2022       05/03/2022    8:10 AM 05/03/2022    8:00 PM 05/04/2022    8:35 AM 05/04/2022    8:00 PM 05/05/2022   11:00 AM  Fall Risk  (RETIRED) Patient Fall Risk Level High fall risk High fall risk High fall risk High fall risk High fall risk   Functional Status Survey:    Vitals:   01/21/23 1012 01/21/23 1021  BP: (!) 164/69 (!) 142/77  Pulse: 71   Resp: 18   Temp: (!) 97.1 F (36.2 C)   SpO2: 94%   Weight: 211 lb 3.2 oz (95.8 kg)   Height: 6' (1.829 m)    Body mass index is 28.64 kg/m. Physical Exam Cardiovascular:     Rate and Rhythm: Normal rate.     Pulses: Normal pulses.  Pulmonary:     Effort: Pulmonary effort is normal.  Abdominal:     General: Abdomen is flat.  Neurological:     Mental Status: He is alert. He is disoriented.     Labs reviewed: Recent Labs    05/05/22 0515 08/03/22 1958 08/03/22 2230 08/04/22 0455 08/06/22 1018 08/12/22 0631 10/09/22 0000 10/16/22 0000 11/24/22 0000  NA 138 139  --  139  --  140 140 138  --   K 3.5 3.2*  --  3.3*   < > 3.8 3.5 3.4* 3.7  CL 104 102  --  103  --  103 103 100  --   CO2 27 28  --  29  --  30 30* 33*  --   GLUCOSE 95 110*  --  94  --  99  --   --   --   BUN 25* 18  --  18  --  32* 22* 15  --   CREATININE 1.34* 1.12  --  0.98  --  1.17 1.3 1.2  --   CALCIUM 8.6* 8.5*  --  8.8*  --  9.0 8.9 8.9  --   MG 1.9  --  2.2  --   --  2.3  --   --   --    < > = values in this interval not displayed.   Recent Labs    04/28/22 1951 10/09/22 0000  AST 23 13*  ALT 15 12  ALKPHOS 73 81  BILITOT 0.7  --   PROT 7.0  --   ALBUMIN 3.3* 3.5   Recent Labs    04/28/22 1951 04/30/22 0557 08/03/22 1958 08/04/22 0455 08/09/22 0442 10/09/22 0000  WBC 8.0   < > 6.5 7.1 6.6 6.3  NEUTROABS 5.7  --   --   --   --   --   HGB 13.1   < > 11.6* 12.0* 12.2* 11.9*  HCT 40.7   < > 35.7* 37.1* 37.4* 37*  MCV 83.7   < > 84.8 84.3 83.5  --   PLT 237   < >  188 179 220 226   < > = values in this interval not displayed.   Lab Results   Component Value Date   TSH 1.511 08/04/2022   No results found for: "HGBA1C" No results found for: "CHOL", "HDL", "LDLCALC", "LDLDIRECT", "TRIG", "CHOLHDL"  Significant Diagnostic Results in last 30 days:  No results found.  Assessment/Plan Dementia with behavioral disturbance (HCC) - Plan: Do not attempt resuscitation (DNR)  Essential hypertension  Bilateral leg edema  Recurrent major depressive disorder, in partial remission (HCC)  Benign prostatic hyperplasia with urinary frequency  Atrial fibrillation, chronic (HCC)  Multiple falls Patient with hx of dementia with baseline disorientation. He is intermittently incontinent of urine, unsteady on his feet and requires aid for ADLS such as cooking, cleaning, self care. FAST level 6d at this time. Will update code status to DNR based on family preferences of comfort measures if patient is pulseless. BP elevated and bilateral edema. Some concern this is due to amlodipine. Will discontinue amlodipine at this time and hydrochlorothiazide and start chlorthalidone 25 mg daily. BMP in 1 week. LUTS with BPH on myrbetriq however not taking whole pills, will plan to change to gemtesa. Multiple falls primarily due to patient's lack of safety awareness in the setting of progressive dementia. Medications for mood/behavioral disturbances in place with some improvement and constant monitoring with day time staff. Rate controlled. No signs of bleeding, continue eliquis.   Family/ staff Communication: Son, nursing  Labs/tests ordered:  BMP

## 2023-01-22 ENCOUNTER — Encounter: Payer: Self-pay | Admitting: Student

## 2023-01-27 ENCOUNTER — Encounter: Payer: Self-pay | Admitting: Nurse Practitioner

## 2023-01-27 ENCOUNTER — Non-Acute Institutional Stay (SKILLED_NURSING_FACILITY): Payer: Medicare Other | Admitting: Nurse Practitioner

## 2023-01-27 DIAGNOSIS — Q845 Enlarged and hypertrophic nails: Secondary | ICD-10-CM | POA: Diagnosis not present

## 2023-01-27 NOTE — Progress Notes (Signed)
Location:  [facility].  Nursing Home Room Number: [facility] [address]A Place of Service:  SNF [phone]) Abbey Chatters, NP  PCP: Earnestine Mealing, MD  Patient Care Team: Earnestine Mealing, MD as PCP - General [facility])  Extended Emergency Contact Information Primary Emergency Contact: Merry Proud Address: [address]          [place], [place] [address] Home Phone: [phone] Mobile Phone: [phone] Relation: Spouse Secondary Emergency Contact: Wrobel,[name] Mobile Phone: [phone] Relation: Son  Goals of care: Advanced Directive information    01/27/2023    2:26 PM  Advanced Directives  Does Patient Have a Medical Advance Directive? Yes  Type of Advance Directive Out of facility DNR (pink MOST or yellow form)  Does patient want to make changes to medical advance directive? No - Patient declined     Chief Complaint  Patient presents with   Acute Visit    Toenail Trimming.     HPI:  Pt is a 87 y.o. male seen today for an acute visit for Toenail Trimming.  Staff reports toenails are overgrown and wonders if they can be trimmed.  Pt denies pain. No redness or swelling to nails.   Past Medical History:  Diagnosis Date   Atrial fibrillation (HCC)    persistent   on NOAC   Hypertension    Vitamin B 12 deficiency    History reviewed. No pertinent surgical history.  No Known Allergies  Outpatient Encounter Medications as of 01/27/2023  Medication Sig   acetaminophen (TYLENOL) 500 MG tablet Take 1,000 mg by mouth every 8 (eight) hours as needed.   apixaban (ELIQUIS) 5 MG TABS tablet Take 5 mg by mouth 2 (two) times daily.   ARIPiprazole (ABILIFY) 10 MG tablet Take 10 mg by mouth daily.   busPIRone (BUSPAR) 5 MG tablet Take 5 mg by mouth 3 (three) times daily.   carbamide peroxide (DEBROX) 6.5 % OTIC solution Place 5 drops into the right ear at bedtime.   chlorthalidone (HYGROTON) 25 MG tablet Take 25 mg by mouth daily.   donepezil (ARICEPT) 10 MG  tablet Take 1 tablet by mouth at bedtime.   melatonin 3 MG TABS tablet Take 3 mg by mouth at bedtime.   memantine (NAMENDA) 10 MG tablet Take 10 mg by mouth 2 (two) times daily.   Multiple Vitamins-Minerals (MULTIVITAMIN WITH MINERALS) tablet Take 1 tablet by mouth daily.   potassium chloride 20 MEQ/15ML (10%) SOLN Take 15 mLs by mouth daily.   sertraline (ZOLOFT) 100 MG tablet Take 100 mg by mouth daily.   tamsulosin (FLOMAX) 0.4 MG CAPS capsule Take 0.4 mg by mouth.   Vibegron (GEMTESA) 75 MG TABS Take 1 tablet by mouth daily.   Zinc Oxide (TRIPLE PASTE) 12.8 % ointment Apply 1 Application topically as needed for irritation. Every shift.   [DISCONTINUED] amLODipine (NORVASC) 10 MG tablet Take 10 mg by mouth daily.   [DISCONTINUED] hydrochlorothiazide (HYDRODIURIL) 25 MG tablet Take 25 mg by mouth daily.   [DISCONTINUED] mirabegron ER (MYRBETRIQ) 25 MG TB24 tablet Take 25 mg by mouth daily.   [DISCONTINUED] Potassium Chloride ER 20 MEQ TBCR Take 1 tablet by mouth daily.   No facility-administered encounter medications on file as of 01/27/2023.    Review of Systems  Constitutional:        Thick toenails. No pain    Immunization History  Administered Date(s) Administered   Influenza Inj Mdck Quad Pf 01/14/2019   Influenza,inj,Quad PF,6+ Mos 03/21/2020   Influenza-Unspecified  01/18/2016, 01/16/2017, 01/29/2018   Moderna Sars-Covid-2 Vaccination 12/01/2019, 12/29/2019   Td 10/26/2017   Td (Adult),5 Lf Tetanus Toxid, Preservative Free 10/26/2017   Td (Adult),unspecified 05/01/2008   Tdap 05/01/2008   Zoster, Live 04/28/2009   Pertinent  Health Maintenance Due  Topic Date Due   INFLUENZA VACCINE  11/27/2022      05/03/2022    8:10 AM 05/03/2022    8:00 PM 05/04/2022    8:35 AM 05/04/2022    8:00 PM 05/05/2022   11:00 AM  Fall Risk  (RETIRED) Patient Fall Risk Level High fall risk High fall risk High fall risk High fall risk High fall risk   Functional Status Survey:    Vitals:    01/27/23 1416  BP: 135/66  Pulse: 62  Resp: 18  Temp: (!) 97.1 F (36.2 C)  SpO2: 94%  Weight: 224 lb 12.8 oz (102 kg)  Height: 6' (1.829 m)   Body mass index is 30.49 kg/m. Physical Exam Feet:     Right foot:     Toenail Condition: Right toenails are abnormally thick.     Left foot:     Toenail Condition: Left toenails are abnormally thick.     Labs reviewed: Recent Labs    05/05/22 0515 08/03/22 1958 08/03/22 2230 08/04/22 0455 08/06/22 1018 08/12/22 0631 10/09/22 0000 10/16/22 0000 11/24/22 0000  NA 138 139  --  139  --  140 140 138  --   K 3.5 3.2*  --  3.3*   < > 3.8 3.5 3.4* 3.7  CL 104 102  --  103  --  103 103 100  --   CO2 27 28  --  29  --  30 30* 33*  --   GLUCOSE 95 110*  --  94  --  99  --   --   --   BUN 25* 18  --  18  --  32* 22* 15  --   CREATININE 1.34* 1.12  --  0.98  --  1.17 1.3 1.2  --   CALCIUM 8.6* 8.5*  --  8.8*  --  9.0 8.9 8.9  --   MG 1.9  --  2.2  --   --  2.3  --   --   --    < > = values in this interval not displayed.   Recent Labs    04/28/22 1951 10/09/22 0000  AST 23 13*  ALT 15 12  ALKPHOS 73 81  BILITOT 0.7  --   PROT 7.0  --   ALBUMIN 3.3* 3.5   Recent Labs    04/28/22 1951 04/30/22 0557 08/03/22 1958 08/04/22 0455 08/09/22 0442 10/09/22 0000  WBC 8.0   < > 6.5 7.1 6.6 6.3  NEUTROABS 5.7  --   --   --   --   --   HGB 13.1   < > 11.6* 12.0* 12.2* 11.9*  HCT 40.7   < > 35.7* 37.1* 37.4* 37*  MCV 83.7   < > 84.8 84.3 83.5  --   PLT 237   < > 188 179 220 226   < > = values in this interval not displayed.   Lab Results  Component Value Date   TSH 1.511 08/04/2022   No results found for: "HGBA1C" No results found for: "CHOL", "HDL", "LDLCALC", "LDLDIRECT", "TRIG", "CHOLHDL"  Significant Diagnostic Results in last 30 days:  No results found.  Assessment/Plan 1. Enlarged and hypertrophic nails -toenails  on bilateral feet are thick, 4 were long enough to be trimmed and trimmed. Otherwise they were not  overgrown.  Discussed thickness due to fungal disease.  He is on podiatry list.  Will monitor.   Janene Harvey. [facility]ag[facility] [phone]

## 2023-02-10 ENCOUNTER — Encounter: Payer: Self-pay | Admitting: Nurse Practitioner

## 2023-02-10 ENCOUNTER — Non-Acute Institutional Stay (SKILLED_NURSING_FACILITY): Payer: Medicare Other | Admitting: Nurse Practitioner

## 2023-02-10 DIAGNOSIS — L309 Dermatitis, unspecified: Secondary | ICD-10-CM

## 2023-02-10 NOTE — Progress Notes (Unsigned)
Location:  [facility].  Nursing Home Room Number: [facility] [address] Place of Service:  SNF (31) Abbey Chatters, NP  PCP: Earnestine Mealing, MD  Patient Care Team: Earnestine Mealing, MD as PCP - General [facility])  Extended Emergency Contact Information Primary Emergency Contact: Merry Proud Address: [address]          [place], [place] [address] Home Phone: [phone] Mobile Phone: [phone] Relation: Spouse Secondary Emergency Contact: Schlosser,[name] Mobile Phone: [phone] Relation: Son  Goals of care: Advanced Directive information    02/10/2023   11:48 AM  Advanced Directives  Does Patient Have a Medical Advance Directive? Yes  Type of Advance Directive Out of facility DNR (pink MOST or yellow form)  Does patient want to make changes to medical advance directive? No - Patient declined     Chief Complaint  Patient presents with   Acute Visit    Red Area on Abdomen.     HPI:  Pt is a 87 y.o. male seen today for an acute visit for red spot area on abdomen.  Staff reports he had area on abdomen that was large, red and firm.  Denies itching or pain.  Today area has significantly improved per nursing. No pain, warmth, drainage.     Past Medical History:  Diagnosis Date   Atrial fibrillation (HCC)    persistent   on NOAC   Hypertension    Vitamin B 12 deficiency    History reviewed. No pertinent surgical history.  No Known Allergies  Outpatient Encounter Medications as of 02/10/2023  Medication Sig   acetaminophen (TYLENOL) 500 MG tablet Take 1,000 mg by mouth every 8 (eight) hours as needed.   apixaban (ELIQUIS) 5 MG TABS tablet Take 5 mg by mouth 2 (two) times daily.   ARIPiprazole (ABILIFY) 10 MG tablet Take 10 mg by mouth daily.   busPIRone (BUSPAR) 5 MG tablet Take 5 mg by mouth 3 (three) times daily.   chlorthalidone (HYGROTON) 25 MG tablet Take 25 mg by mouth daily.   donepezil (ARICEPT) 10 MG tablet Take 1 tablet by mouth at  bedtime.   melatonin 3 MG TABS tablet Take 3 mg by mouth at bedtime.   memantine (NAMENDA) 10 MG tablet Take 10 mg by mouth 2 (two) times daily.   Multiple Vitamins-Minerals (MULTIVITAMIN WITH MINERALS) tablet Take 1 tablet by mouth daily.   potassium chloride 20 MEQ/15ML (10%) SOLN Take 15 mLs by mouth daily.   sertraline (ZOLOFT) 100 MG tablet Take 100 mg by mouth daily.   tamsulosin (FLOMAX) 0.4 MG CAPS capsule Take 0.4 mg by mouth.   Vibegron (GEMTESA) 75 MG TABS Take 1 tablet by mouth daily.   Zinc Oxide (TRIPLE PASTE) 12.8 % ointment Apply 1 Application topically as needed for irritation. Every shift.   [DISCONTINUED] carbamide peroxide (DEBROX) 6.5 % OTIC solution Place 5 drops into the right ear at bedtime.   No facility-administered encounter medications on file as of 02/10/2023.    Review of Systems  Unable to perform ROS: Dementia    Immunization History  Administered Date(s) Administered   Influenza Inj Mdck Quad Pf 01/14/2019   Influenza,inj,Quad PF,6+ Mos 03/21/2020   Influenza-Unspecified 01/18/2016, 01/16/2017, 01/29/2018   Moderna Sars-Covid-2 Vaccination 12/01/2019, 12/29/2019   Td 10/26/2017   Td (Adult),5 Lf Tetanus Toxid, Preservative Free 10/26/2017   Td (Adult),unspecified 05/01/2008   Tdap 05/01/2008   Zoster, Live 04/28/2009   Pertinent  Health Maintenance Due  Topic Date Due   INFLUENZA  VACCINE  11/27/2022      05/03/2022    8:10 AM 05/03/2022    8:00 PM 05/04/2022    8:35 AM 05/04/2022    8:00 PM 05/05/2022   11:00 AM  Fall Risk  (RETIRED) Patient Fall Risk Level High fall risk High fall risk High fall risk High fall risk High fall risk   Functional Status Survey:    Vitals:   02/10/23 1142 02/10/23 1150  BP: (!) 160/82 (!) 157/74  Pulse: (!) 55   Resp: 18   Temp: 98.3 F (36.8 C)   SpO2: 93%   Weight: 227 lb 9.6 oz (103.2 kg)   Height: 6' (1.829 m)    Body mass index is 30.87 kg/m. Physical Exam Constitutional:      Appearance: Normal  appearance.  Skin:    General: Skin is warm and dry.     Comments: Slightly red area to right mid abdomen. No tenderness, heat or drainage noted.  Slightly raised. Not hard  Abdomen soft.    Neurological:     Mental Status: He is alert. Mental status is at baseline. He is disoriented.  Psychiatric:        Mood and Affect: Mood normal.     Labs reviewed: Recent Labs    05/05/22 0515 08/03/22 1958 08/03/22 2230 08/04/22 0455 08/06/22 1018 08/12/22 0631 10/09/22 0000 10/16/22 0000 11/24/22 0000  NA 138 139  --  139  --  140 140 138  --   K 3.5 3.2*  --  3.3*   < > 3.8 3.5 3.4* 3.7  CL 104 102  --  103  --  103 103 100  --   CO2 27 28  --  29  --  30 30* 33*  --   GLUCOSE 95 110*  --  94  --  99  --   --   --   BUN 25* 18  --  18  --  32* 22* 15  --   CREATININE 1.34* 1.12  --  0.98  --  1.17 1.3 1.2  --   CALCIUM 8.6* 8.5*  --  8.8*  --  9.0 8.9 8.9  --   MG 1.9  --  2.2  --   --  2.3  --   --   --    < > = values in this interval not displayed.   Recent Labs    04/28/22 1951 10/09/22 0000  AST 23 13*  ALT 15 12  ALKPHOS 73 81  BILITOT 0.7  --   PROT 7.0  --   ALBUMIN 3.3* 3.5   Recent Labs    04/28/22 1951 04/30/22 0557 08/03/22 1958 08/04/22 0455 08/09/22 0442 10/09/22 0000  WBC 8.0   < > 6.5 7.1 6.6 6.3  NEUTROABS 5.7  --   --   --   --   --   HGB 13.1   < > 11.6* 12.0* 12.2* 11.9*  HCT 40.7   < > 35.7* 37.1* 37.4* 37*  MCV 83.7   < > 84.8 84.3 83.5  --   PLT 237   < > 188 179 220 226   < > = values in this interval not displayed.   Lab Results  Component Value Date   TSH 1.511 08/04/2022   No results found for: "HGBA1C" No results found for: "CHOL", "HDL", "LDLCALC", "LDLDIRECT", "TRIG", "CHOLHDL"  Significant Diagnostic Results in last 30 days:  No results found.  Assessment/Plan 1.  Dermatitis Area is improving. Abdomen soft, nontender, no warmth noted.  Will have staff continue to monitor and notify if worsens.   Janene Harvey. [facility]ag[facility] [phone]

## 2023-02-16 LAB — CBC: RBC: 4.82 (ref 3.87–5.11)

## 2023-02-16 LAB — CBC AND DIFFERENTIAL
HCT: 42 (ref 41–53)
Hemoglobin: 12.9 — AB (ref 13.5–17.5)
Platelets: 233 10*3/uL (ref 150–400)
WBC: 6.4

## 2023-02-16 LAB — HEPATIC FUNCTION PANEL
ALT: 13 U/L (ref 10–40)
AST: 18 (ref 14–40)
Alkaline Phosphatase: 100 (ref 25–125)
Bilirubin, Total: 0.9

## 2023-02-16 LAB — COMPREHENSIVE METABOLIC PANEL
Albumin: 3.9 (ref 3.5–5.0)
Calcium: 9.3 (ref 8.7–10.7)
Globulin: 2.8
eGFR: 46

## 2023-02-16 LAB — BASIC METABOLIC PANEL
BUN: 18 (ref 4–21)
CO2: 31 — AB (ref 13–22)
Chloride: 100 (ref 99–108)
Creatinine: 1.5 — AB (ref 0.6–1.3)
Glucose: 124
Potassium: 3.6 meq/L (ref 3.5–5.1)
Sodium: 139 (ref 137–147)

## 2023-02-20 ENCOUNTER — Non-Acute Institutional Stay (SKILLED_NURSING_FACILITY): Payer: Medicare Other | Admitting: Student

## 2023-02-20 ENCOUNTER — Encounter: Payer: Self-pay | Admitting: Student

## 2023-02-20 DIAGNOSIS — F03918 Unspecified dementia, unspecified severity, with other behavioral disturbance: Secondary | ICD-10-CM | POA: Diagnosis not present

## 2023-02-20 DIAGNOSIS — F3341 Major depressive disorder, recurrent, in partial remission: Secondary | ICD-10-CM

## 2023-02-20 DIAGNOSIS — I10 Essential (primary) hypertension: Secondary | ICD-10-CM | POA: Diagnosis not present

## 2023-02-20 DIAGNOSIS — I509 Heart failure, unspecified: Secondary | ICD-10-CM

## 2023-02-20 DIAGNOSIS — E440 Moderate protein-calorie malnutrition: Secondary | ICD-10-CM

## 2023-02-20 DIAGNOSIS — I482 Chronic atrial fibrillation, unspecified: Secondary | ICD-10-CM

## 2023-02-20 DIAGNOSIS — M436 Torticollis: Secondary | ICD-10-CM

## 2023-02-20 NOTE — Progress Notes (Signed)
Location:  Other [facility].  Nursing Home Room Number: [facility] 414A Place of Service:  SNF [phone]) Provider:  Earnestine Mealing, MD  Patient Care Team: Earnestine Mealing, MD as PCP - General [facility] Medicine)  Extended Emergency Contact Information Primary Emergency Contact: Merry Proud Address: [address]          [place], [place] [address] Home Phone: [phone] Mobile Phone: [phone] Relation: Spouse Secondary Emergency Contact: Granda,[name] Mobile Phone: [phone] Relation: Son  Code Status:  DNR Goals of care: Advanced Directive information    02/20/2023   10:29 AM  Advanced Directives  Does Patient Have a Medical Advance Directive? Yes  Type of Advance Directive Out of facility DNR (pink MOST or yellow form)  Does patient want to make changes to medical advance directive? No - Patient declined     Chief Complaint  Patient presents with   Acute Visit    Volume Overload    HPI:  Jim Blake is a 87 y.o. male seen today for an acute visit for Volume Overload and routine management of chronic conditions.   Patient has had significant weight gain in the last few months with acute increase of 20+ lb in the last 4 months. BNP elevated > 900. Discussed concern with patient's daughter and son who state if he were to become ill, they would want him to remain in the facility and comfort measures only.   Per nursing he has been eating less and weaker over the last few days.   He is 2- person dependent. Incontinent of bowel and urine. Disoriented, however, recognizes his daughter and son-- cannot give their names.   Past Medical History:  Diagnosis Date   Atrial fibrillation (HCC)    persistent   on NOAC   Hypertension    Vitamin B 12 deficiency    History reviewed. No pertinent surgical history.  No Known Allergies  Outpatient Encounter Medications as of 02/20/2023  Medication Sig   acetaminophen (TYLENOL) 500 MG tablet Take 1,000 mg by mouth every 8  (eight) hours as needed.   apixaban (ELIQUIS) 5 MG TABS tablet Take 5 mg by mouth 2 (two) times daily.   ARIPiprazole (ABILIFY) 10 MG tablet Take 10 mg by mouth daily.   busPIRone (BUSPAR) 5 MG tablet Take 5 mg by mouth 3 (three) times daily.   chlorthalidone (HYGROTON) 25 MG tablet Take 25 mg by mouth daily.   donepezil (ARICEPT) 10 MG tablet Take 1 tablet by mouth at bedtime.   melatonin 3 MG TABS tablet Take 3 mg by mouth at bedtime.   memantine (NAMENDA) 10 MG tablet Take 10 mg by mouth 2 (two) times daily.   Multiple Vitamins-Minerals (MULTIVITAMIN WITH MINERALS) tablet Take 1 tablet by mouth daily.   potassium chloride 20 MEQ/15ML (10%) SOLN Take 15 mLs by mouth daily.   sertraline (ZOLOFT) 100 MG tablet Take 100 mg by mouth daily.   tamsulosin (FLOMAX) 0.4 MG CAPS capsule Take 0.4 mg by mouth.   Vibegron (GEMTESA) 75 MG TABS Take 1 tablet by mouth daily.   Zinc Oxide (TRIPLE PASTE) 12.8 % ointment Apply 1 Application topically as needed for irritation. Every shift.   No facility-administered encounter medications on file as of 02/20/2023.    Review of Systems  Immunization History  Administered Date(s) Administered   Influenza Inj Mdck Quad Pf 01/14/2019   Influenza,inj,Quad PF,6+ Mos 03/21/2020   Influenza-Unspecified 01/18/2016, 01/16/2017, 01/29/2018, 02/18/2023   Moderna Sars-Covid-2 Vaccination 12/01/2019, 12/29/2019   Td 10/26/2017  Td (Adult),5 Lf Tetanus Toxid, Preservative Free 10/26/2017   Td (Adult),unspecified 05/01/2008   Tdap 05/01/2008   Zoster, Live 04/28/2009   Pertinent  Health Maintenance Due  Topic Date Due   INFLUENZA VACCINE  Completed      05/03/2022    8:10 AM 05/03/2022    8:00 PM 05/04/2022    8:35 AM 05/04/2022    8:00 PM 05/05/2022   11:00 AM  Fall Risk  (RETIRED) Patient Fall Risk Level High fall risk High fall risk High fall risk High fall risk High fall risk   Functional Status Survey:    Vitals:   02/20/23 1024  BP: (!) 132/58  Pulse:  94  Resp: 18  Temp: (!) 97.3 F (36.3 C)  SpO2: 93%  Weight: 223 lb 12.8 oz (101.5 kg)  Height: 6' (1.829 m)   Body mass index is 30.35 kg/m. Physical Exam Cardiovascular:     Rate and Rhythm: Normal rate.     Pulses: Normal pulses.  Pulmonary:     Effort: Pulmonary effort is normal.     Comments: Bilateral expiratory wheeze Musculoskeletal:        General: Swelling present.  Neurological:     Mental Status: He is alert.     Labs reviewed: Recent Labs    05/05/22 0515 08/03/22 1958 08/03/22 2230 08/04/22 0455 08/06/22 1018 08/12/22 0631 10/09/22 0000 10/16/22 0000 11/24/22 0000 02/16/23 0000  NA 138 139  --  139  --  140 140 138  --  139  K 3.5 3.2*  --  3.3*   < > 3.8 3.5 3.4* 3.7 3.6  CL 104 102  --  103  --  103 103 100  --  100  CO2 27 28  --  29  --  30 30* 33*  --  31*  GLUCOSE 95 110*  --  94  --  99  --   --   --   --   BUN 25* 18  --  18  --  32* 22* 15  --  18  CREATININE 1.34* 1.12  --  0.98  --  1.17 1.3 1.2  --  1.5*  CALCIUM 8.6* 8.5*  --  8.8*  --  9.0 8.9 8.9  --  9.3  MG 1.9  --  2.2  --   --  2.3  --   --   --   --    < > = values in this interval not displayed.   Recent Labs    04/28/22 1951 10/09/22 0000 02/16/23 0000  AST 23 13* 18  ALT 15 12 13   ALKPHOS 73 81 100  BILITOT 0.7  --   --   PROT 7.0  --   --   ALBUMIN 3.3* 3.5 3.9   Recent Labs    04/28/22 1951 04/30/22 0557 08/03/22 1958 08/04/22 0455 08/09/22 0442 10/09/22 0000 02/16/23 0000  WBC 8.0   < > 6.5 7.1 6.6 6.3 6.4  NEUTROABS 5.7  --   --   --   --   --   --   HGB 13.1   < > 11.6* 12.0* 12.2* 11.9* 12.9*  HCT 40.7   < > 35.7* 37.1* 37.4* 37* 42  MCV 83.7   < > 84.8 84.3 83.5  --   --   PLT 237   < > 188 179 220 226 233   < > = values in this interval not displayed.   Lab  Results  Component Value Date   TSH 1.511 08/04/2022   No results found for: "HGBA1C" No results found for: "CHOL", "HDL", "LDLCALC", "LDLDIRECT", "TRIG", "CHOLHDL"  Significant  Diagnostic Results in last 30 days:  No results found.  Assessment/Plan Acute congestive heart failure, unspecified heart failure type (HCC)  Essential hypertension  Dementia with behavioral disturbance (HCC)  Recurrent major depressive disorder, in partial remission (HCC)  Atrial fibrillation, chronic (HCC)  Malnutrition of moderate degree (HCC)  Spastic torticollis Patient likely in acute CHF exacerbation. Echocardiogram ordered. Lasix 40 mg daily. Continue daily weights. Per goals of care with family, goal would be comfort measures if he does not have improvement with diuresis. Continue current medications for behaviors and dementia. Rate well-controlled at this time. No signs of bleeding, continue anticoagulation.   Family/ staff Communication: nursing, [facility]  Labs/tests ordered:  BMP, mg 1 week

## 2023-02-26 LAB — BASIC METABOLIC PANEL
BUN: 20 (ref 4–21)
CO2: 34 — AB (ref 13–22)
Chloride: 94 — AB (ref 99–108)
Creatinine: 1.5 — AB (ref 0.6–1.3)
Glucose: 117
Potassium: 3.1 meq/L — AB (ref 3.5–5.1)
Sodium: 137 (ref 137–147)

## 2023-02-26 LAB — COMPREHENSIVE METABOLIC PANEL: Calcium: 9.4 (ref 8.7–10.7)

## 2023-03-02 ENCOUNTER — Encounter: Payer: Self-pay | Admitting: Adult Health

## 2023-03-02 ENCOUNTER — Non-Acute Institutional Stay (SKILLED_NURSING_FACILITY): Payer: Medicare Other | Admitting: Adult Health

## 2023-03-02 DIAGNOSIS — B372 Candidiasis of skin and nail: Secondary | ICD-10-CM | POA: Diagnosis not present

## 2023-03-02 NOTE — Progress Notes (Signed)
Location:  Other Nursing Home Room Number: 414 A Place of Service:  SNF (31) Provider:  Kenard Gower, DNP, FNP-BC  Patient Care Team: Earnestine Mealing, MD as PCP - General (Family Medicine)  Extended Emergency Contact Information Primary Emergency Contact: Merry Proud Address: [address]          [place], [place] [address] Home Phone: [phone] Mobile Phone: [phone] Relation: Spouse Secondary Emergency Contact: Rosen,[name] Mobile Phone: [phone] Relation: Son  Code Status:  DNR  Goals of care: Advanced Directive information    02/20/2023   10:29 AM  Advanced Directives  Does Patient Have a Medical Advance Directive? Yes  Type of Advance Directive Out of facility DNR (pink MOST or yellow form)  Does patient want to make changes to medical advance directive? No - Patient declined     Chief Complaint  Patient presents with   Acute Visit    Rashes to groin    HPI:  Pt is a 87 y.o. male seen today for an acute visit regarding rashes to groin. He is a resident of [facility]. He has erythematous rashes to bilateral groin. He is not able to move BLE.   Past Medical History:  Diagnosis Date   Atrial fibrillation (HCC)    persistent   on NOAC   Hypertension    Vitamin B 12 deficiency    No past surgical history on file.  No Known Allergies  Outpatient Encounter Medications as of 03/02/2023  Medication Sig   acetaminophen (TYLENOL) 500 MG tablet Take 1,000 mg by mouth every 8 (eight) hours as needed.   apixaban (ELIQUIS) 5 MG TABS tablet Take 5 mg by mouth 2 (two) times daily.   ARIPiprazole (ABILIFY) 10 MG tablet Take 10 mg by mouth daily.   busPIRone (BUSPAR) 5 MG tablet Take 5 mg by mouth 3 (three) times daily.   chlorthalidone (HYGROTON) 25 MG tablet Take 25 mg by mouth daily.   donepezil (ARICEPT) 10 MG tablet Take 1 tablet by mouth at bedtime.   melatonin 3 MG TABS tablet Take 3 mg by mouth at bedtime.   memantine (NAMENDA)  10 MG tablet Take 10 mg by mouth 2 (two) times daily.   Multiple Vitamins-Minerals (MULTIVITAMIN WITH MINERALS) tablet Take 1 tablet by mouth daily.   potassium chloride 20 MEQ/15ML (10%) SOLN Take 15 mLs by mouth daily.   sertraline (ZOLOFT) 100 MG tablet Take 100 mg by mouth daily.   tamsulosin (FLOMAX) 0.4 MG CAPS capsule Take 0.4 mg by mouth.   Vibegron (GEMTESA) 75 MG TABS Take 1 tablet by mouth daily.   Zinc Oxide (TRIPLE PASTE) 12.8 % ointment Apply 1 Application topically as needed for irritation. Every shift.   No facility-administered encounter medications on file as of 03/02/2023.    Review of Systems  Unable to obtain due to dementia.    Immunization History  Administered Date(s) Administered   Influenza Inj Mdck Quad Pf 01/14/2019   Influenza,inj,Quad PF,6+ Mos 03/21/2020   Influenza-Unspecified 01/18/2016, 01/16/2017, 01/29/2018, 02/18/2023   Moderna Sars-Covid-2 Vaccination 12/01/2019, 12/29/2019   Td 10/26/2017   Td (Adult),5 Lf Tetanus Toxid, Preservative Free 10/26/2017   Td (Adult),unspecified 05/01/2008   Tdap 05/01/2008   Zoster, Live 04/28/2009   Pertinent  Health Maintenance Due  Topic Date Due   INFLUENZA VACCINE  Completed      05/03/2022    8:10 AM 05/03/2022    8:00 PM 05/04/2022    8:35 AM 05/04/2022    8:00  PM 05/05/2022   11:00 AM  Fall Risk  (RETIRED) Patient Fall Risk Level High fall risk High fall risk High fall risk High fall risk High fall risk     Vitals:   03/02/23 1724  BP: 129/68  Pulse: 77  Resp: 20  Temp: 97.9 F (36.6 C)  SpO2: 93%  Weight: 218 lb 3.2 oz (99 kg)  Height: 6' (1.829 m)   Body mass index is 29.59 kg/m.  Physical Exam Constitutional:      General: He is not in acute distress. HENT:     Head: Normocephalic and atraumatic.     Mouth/Throat:     Mouth: Mucous membranes are moist.  Eyes:     Conjunctiva/sclera: Conjunctivae normal.  Cardiovascular:     Rate and Rhythm: Normal rate and regular rhythm.      Pulses: Normal pulses.     Heart sounds: Normal heart sounds.  Pulmonary:     Effort: Pulmonary effort is normal.     Breath sounds: Normal breath sounds.  Abdominal:     General: Bowel sounds are normal.     Palpations: Abdomen is soft.  Musculoskeletal:        General: No swelling.     Cervical back: Normal range of motion.     Comments: Not moving BLE  Skin:    General: Skin is warm and dry.     Comments: Erythematous rashes to bilateral groin  Neurological:     Mental Status: He is alert. Mental status is at baseline.  Psychiatric:        Mood and Affect: Mood normal.        Behavior: Behavior normal.      Labs reviewed: Recent Labs    05/05/22 0515 08/03/22 1958 08/03/22 2230 08/04/22 0455 08/06/22 1018 08/12/22 0631 10/09/22 0000 10/16/22 0000 11/24/22 0000 02/16/23 0000  NA 138 139  --  139  --  140 140 138  --  139  K 3.5 3.2*  --  3.3*   < > 3.8 3.5 3.4* 3.7 3.6  CL 104 102  --  103  --  103 103 100  --  100  CO2 27 28  --  29  --  30 30* 33*  --  31*  GLUCOSE 95 110*  --  94  --  99  --   --   --   --   BUN 25* 18  --  18  --  32* 22* 15  --  18  CREATININE 1.34* 1.12  --  0.98  --  1.17 1.3 1.2  --  1.5*  CALCIUM 8.6* 8.5*  --  8.8*  --  9.0 8.9 8.9  --  9.3  MG 1.9  --  2.2  --   --  2.3  --   --   --   --    < > = values in this interval not displayed.   Recent Labs    04/28/22 1951 10/09/22 0000 02/16/23 0000  AST 23 13* 18  ALT 15 12 13   ALKPHOS 73 81 100  BILITOT 0.7  --   --   PROT 7.0  --   --   ALBUMIN 3.3* 3.5 3.9   Recent Labs    04/28/22 1951 04/30/22 0557 08/03/22 1958 08/04/22 0455 08/09/22 0442 10/09/22 0000 02/16/23 0000  WBC 8.0   < > 6.5 7.1 6.6 6.3 6.4  NEUTROABS 5.7  --   --   --   --   --   --  HGB 13.1   < > 11.6* 12.0* 12.2* 11.9* 12.9*  HCT 40.7   < > 35.7* 37.1* 37.4* 37* 42  MCV 83.7   < > 84.8 84.3 83.5  --   --   PLT 237   < > 188 179 220 226 233   < > = values in this interval not displayed.   Lab  Results  Component Value Date   TSH 1.511 08/04/2022   No results found for: "HGBA1C" No results found for: "CHOL", "HDL", "LDLCALC", "LDLDIRECT", "TRIG", "CHOLHDL"  Significant Diagnostic Results in last 30 days:  No results found.  Assessment/Plan  1. Candidal skin infection -  will start on Nystatin ointment apply topically to rashes on bilateral groin BID till resolved -  Keep skin clean and dry -   monitor for skin breakdown     Family/ staff Communication: Discussed plan of care with charge nurse.  Labs/tests ordered:  None    Kenard Gower, DNP, MSN, FNP-BC [facility] [phone] (Monday-Friday 8:00 a.m. - 5:00 p.m.) [phone] (after hours)

## 2023-03-19 LAB — COMPREHENSIVE METABOLIC PANEL: Calcium: 9.3 (ref 8.7–10.7)

## 2023-03-19 LAB — BASIC METABOLIC PANEL
BUN: 21 (ref 4–21)
CO2: 34 — AB (ref 13–22)
Chloride: 95 — AB (ref 99–108)
Creatinine: 1.6 — AB (ref 0.6–1.3)
Glucose: 92
Potassium: 2.9 meq/L — AB (ref 3.5–5.1)
Sodium: 139 (ref 137–147)

## 2023-03-23 LAB — BASIC METABOLIC PANEL
BUN: 24 — AB (ref 4–21)
CO2: 32 — AB (ref 13–22)
Chloride: 100 (ref 99–108)
Creatinine: 1.6 — AB (ref 0.6–1.3)
Glucose: 88
Potassium: 3.4 meq/L — AB (ref 3.5–5.1)
Sodium: 138 (ref 137–147)

## 2023-03-23 LAB — COMPREHENSIVE METABOLIC PANEL: Calcium: 9.3 (ref 8.7–10.7)

## 2023-03-24 ENCOUNTER — Non-Acute Institutional Stay (SKILLED_NURSING_FACILITY): Payer: Medicare Other | Admitting: Nurse Practitioner

## 2023-03-24 ENCOUNTER — Encounter: Payer: Self-pay | Admitting: Nurse Practitioner

## 2023-03-24 DIAGNOSIS — I482 Chronic atrial fibrillation, unspecified: Secondary | ICD-10-CM | POA: Diagnosis not present

## 2023-03-24 DIAGNOSIS — E876 Hypokalemia: Secondary | ICD-10-CM

## 2023-03-24 DIAGNOSIS — F03918 Unspecified dementia, unspecified severity, with other behavioral disturbance: Secondary | ICD-10-CM | POA: Diagnosis not present

## 2023-03-24 DIAGNOSIS — I10 Essential (primary) hypertension: Secondary | ICD-10-CM

## 2023-03-24 DIAGNOSIS — F3341 Major depressive disorder, recurrent, in partial remission: Secondary | ICD-10-CM

## 2023-03-24 DIAGNOSIS — R350 Frequency of micturition: Secondary | ICD-10-CM

## 2023-03-24 DIAGNOSIS — N401 Enlarged prostate with lower urinary tract symptoms: Secondary | ICD-10-CM

## 2023-03-24 DIAGNOSIS — I509 Heart failure, unspecified: Secondary | ICD-10-CM

## 2023-03-24 NOTE — Progress Notes (Signed)
Location:  Other [facility]) Nursing Home Room Number: 414 A Place of Service:  SNF (31)  Jim Blake K. [name], NP   Patient Care Team: Jim Mealing, MD as PCP - General [facility] Medicine)  Extended Emergency Contact Information Primary Emergency Contact: Jim Blake Address: [address]          [place], [place] [address] Home Phone: [phone] Mobile Phone: [phone] Relation: Spouse Secondary Emergency Contact: Jim Blake,Jim Blake Mobile Phone: [phone] Relation: Son  Goals of care: Advanced Directive information    03/24/2023   11:41 AM  Advanced Directives  Does Patient Have a Medical Advance Directive? Yes  Type of Advance Directive Out of facility DNR (pink MOST or yellow form)  Does patient want to make changes to medical advance directive? No - Patient declined     Chief Complaint  Patient presents with   Medical Management of Chronic Issues    Routine visit. Discuss need for AWV, pneumonia vaccine, shingrix, and covid boosters.     HPI:  Pt is a 87 y.o. male seen today for medical management of chronic disease. Pt with hx of chf, dementia, htn, bph.  Pt has been eating well.  No increase in agitation or anxiety.  Staff has no acute concerns.  Pt reports he feels well. No pain.  Weigh has been stable.  Recent potassium continues to be low but improving with increase in supplement  Past Medical History:  Diagnosis Date   Atrial fibrillation (HCC)    persistent   on NOAC   Hypertension    Vitamin B 12 deficiency    History reviewed. No pertinent surgical history.  No Known Allergies  Outpatient Encounter Medications as of 03/24/2023  Medication Sig   acetaminophen (TYLENOL) 500 MG tablet Take 1,000 mg by mouth every 8 (eight) hours as needed.   apixaban (ELIQUIS) 5 MG TABS tablet Take 5 mg by mouth 2 (two) times daily.   ARIPiprazole (ABILIFY) 10 MG tablet Take 10 mg by mouth daily.   busPIRone (BUSPAR) 5 MG tablet Take 5 mg by mouth 3  (three) times daily.   chlorthalidone (HYGROTON) 25 MG tablet Take 25 mg by mouth daily.   donepezil (ARICEPT) 10 MG tablet Take 1 tablet by mouth at bedtime.   furosemide (LASIX) 40 MG tablet Take 40 mg by mouth daily.   melatonin 3 MG TABS tablet Take 3 mg by mouth at bedtime.   memantine (NAMENDA) 10 MG tablet Take 10 mg by mouth 2 (two) times daily.   Multiple Vitamins-Minerals (MULTIVITAMIN WITH MINERALS) tablet Take 1 tablet by mouth daily.   potassium chloride (KLOR-CON) 20 MEQ packet Take 40 mEq by mouth 2 (two) times daily.   sertraline (ZOLOFT) 100 MG tablet Take 100 mg by mouth daily.   tamsulosin (FLOMAX) 0.4 MG CAPS capsule Take 0.4 mg by mouth.   Vibegron (GEMTESA) 75 MG TABS Take 1 tablet by mouth daily.   Zinc Oxide (TRIPLE PASTE) 12.8 % ointment Apply 1 Application topically as needed for irritation. Every shift.   potassium chloride 20 MEQ/15ML (10%) SOLN Take 15 mLs by mouth daily. (Patient not taking: Reported on 03/24/2023)   No facility-administered encounter medications on file as of 03/24/2023.    Review of Systems  Unable to perform ROS: Dementia     Immunization History  Administered Date(s) Administered   Influenza Inj Mdck Quad Pf 01/14/2019   Influenza,inj,Quad PF,6+ Mos 03/21/2020   Influenza-Unspecified 01/18/2016, 01/16/2017, 01/29/2018, 02/18/2023   Moderna Sars-Covid-2 Vaccination 12/01/2019, 12/29/2019  Td 10/26/2017   Td (Adult),5 Lf Tetanus Toxid, Preservative Free 10/26/2017   Td (Adult),unspecified 05/01/2008   Tdap 05/01/2008   Zoster, Live 04/28/2009   Pertinent  Health Maintenance Due  Topic Date Due   INFLUENZA VACCINE  Completed      05/03/2022    8:10 AM 05/03/2022    8:00 PM 05/04/2022    8:35 AM 05/04/2022    8:00 PM 05/05/2022   11:00 AM  Fall Risk  (RETIRED) Patient Fall Risk Level High fall risk High fall risk High fall risk High fall risk High fall risk   Functional Status Survey:    Vitals:   03/24/23 1022  BP: 126/71   Pulse: 61  Temp: 97.9 F (36.6 C)  Weight: 218 lb 3.2 oz (99 kg)  Height: 6' (1.829 m)   Body mass index is 29.59 kg/m. Physical Exam Constitutional:      General: He is not in acute distress.    Appearance: He is well-developed. He is not diaphoretic.  HENT:     Head: Normocephalic and atraumatic.     Right Ear: External ear normal.     Left Ear: External ear normal.     Mouth/Throat:     Pharynx: No oropharyngeal exudate.  Eyes:     Conjunctiva/sclera: Conjunctivae normal.     Pupils: Pupils are equal, round, and reactive to light.  Cardiovascular:     Rate and Rhythm: Normal rate and regular rhythm.     Heart sounds: Normal heart sounds.  Pulmonary:     Effort: Pulmonary effort is normal.     Breath sounds: Normal breath sounds.  Abdominal:     General: Bowel sounds are normal.     Palpations: Abdomen is soft.  Musculoskeletal:        General: No tenderness.     Cervical back: Normal range of motion and neck supple.     Right lower leg: No edema.     Left lower leg: No edema.  Skin:    General: Skin is warm and dry.  Neurological:     Mental Status: He is alert. Mental status is at baseline. He is disoriented.     Motor: Weakness present.     Gait: Gait abnormal.     Labs reviewed: Recent Labs    05/05/22 0515 08/03/22 1958 08/03/22 2230 08/04/22 0455 08/06/22 1018 08/12/22 0631 10/09/22 0000 02/26/23 0000 03/19/23 0000 03/23/23 0000  NA 138 139  --  139  --  140   < > 137 139 138  K 3.5 3.2*  --  3.3*   < > 3.8   < > 3.1* 2.9* 3.4*  CL 104 102  --  103  --  103   < > 94* 95* 100  CO2 27 28  --  29  --  30   < > 34* 34* 32*  GLUCOSE 95 110*  --  94  --  99  --   --   --   --   BUN 25* 18  --  18  --  32*   < > 20 21 24*  CREATININE 1.34* 1.12  --  0.98  --  1.17   < > 1.5* 1.6* 1.6*  CALCIUM 8.6* 8.5*  --  8.8*  --  9.0   < > 9.4 9.3 9.3  MG 1.9  --  2.2  --   --  2.3  --   --   --   --    < > =  values in this interval not displayed.   Recent  Labs    04/28/22 1951 10/09/22 0000 02/16/23 0000  AST 23 13* 18  ALT 15 12 13   ALKPHOS 73 81 100  BILITOT 0.7  --   --   PROT 7.0  --   --   ALBUMIN 3.3* 3.5 3.9   Recent Labs    04/28/22 1951 04/30/22 0557 08/03/22 1958 08/04/22 0455 08/09/22 0442 10/09/22 0000 02/16/23 0000  WBC 8.0   < > 6.5 7.1 6.6 6.3 6.4  NEUTROABS 5.7  --   --   --   --   --   --   HGB 13.1   < > 11.6* 12.0* 12.2* 11.9* 12.9*  HCT 40.7   < > 35.7* 37.1* 37.4* 37* 42  MCV 83.7   < > 84.8 84.3 83.5  --   --   PLT 237   < > 188 179 220 226 233   < > = values in this interval not displayed.   Lab Results  Component Value Date   TSH 1.511 08/04/2022   No results found for: "HGBA1C" No results found for: "CHOL", "HDL", "LDLCALC", "LDLDIRECT", "TRIG", "CHOLHDL"  Significant Diagnostic Results in last 30 days:  No results found.  Assessment/Plan 1. Essential hypertension -Blood pressure well controlled, goal bp <140/90 Continue current medications and dietary modifications follow metabolic panel  2. Dementia with behavioral disturbance (HCC) -Stable, no acute changes in cognitive or functional status, continue supportive care.  No increase in behaviors  3. Recurrent major depressive disorder, in partial remission (HCC) Stable on zoloft   4. Atrial fibrillation, chronic (HCC) -rate controlled. Continues on eliquis for anticoagulation.   6. Hypokalemia Continues on lasix due to CHF, improved on recent lab due to supplement. Will follow up in 1 week to ensure labs are stable.   7. Congestive heart failure, unspecified HF chronicity, unspecified heart failure type (HCC) Euvolemic, continues on lasix 40 mg daily  8. Benign prostatic hyperplasia with urinary frequency Stable on flomax and gemtesa     Jim Blake K. Biagio Borg [facility] [phone]

## 2023-03-30 LAB — BASIC METABOLIC PANEL
BUN: 18 (ref 4–21)
CO2: 32 — AB (ref 13–22)
Chloride: 97 — AB (ref 99–108)
Creatinine: 1.5 — AB (ref 0.6–1.3)
Glucose: 81
Potassium: 3.7 meq/L (ref 3.5–5.1)
Sodium: 138 (ref 137–147)

## 2023-03-30 LAB — COMPREHENSIVE METABOLIC PANEL
Calcium: 9.1 (ref 8.7–10.7)
eGFR: 46

## 2023-04-01 ENCOUNTER — Encounter: Payer: Self-pay | Admitting: Student

## 2023-04-13 ENCOUNTER — Non-Acute Institutional Stay (SKILLED_NURSING_FACILITY): Payer: Medicare Other | Admitting: Student

## 2023-04-13 DIAGNOSIS — I10 Essential (primary) hypertension: Secondary | ICD-10-CM

## 2023-04-13 DIAGNOSIS — F03918 Unspecified dementia, unspecified severity, with other behavioral disturbance: Secondary | ICD-10-CM

## 2023-04-13 DIAGNOSIS — I509 Heart failure, unspecified: Secondary | ICD-10-CM

## 2023-04-13 DIAGNOSIS — B351 Tinea unguium: Secondary | ICD-10-CM

## 2023-04-13 DIAGNOSIS — I482 Chronic atrial fibrillation, unspecified: Secondary | ICD-10-CM

## 2023-04-14 ENCOUNTER — Encounter: Payer: Self-pay | Admitting: Student

## 2023-04-14 NOTE — Progress Notes (Signed)
Location:  Other Nursing Home Room Number: 414 Place of Service:  SNF (31) Provider:  Judeth Horn, MD  Patient Care Team: Earnestine Mealing, MD as PCP - General (Family Medicine)  Extended Emergency Contact Information Primary Emergency Contact: Merry Proud Address: [address]          [place], [place] [address] Home Phone: [phone] Mobile Phone: [phone] Relation: Spouse Secondary Emergency Contact: Riggio,[name] Mobile Phone: [phone] Relation: Son  Code Status:  DNR Goals of care: Advanced Directive information    03/24/2023   11:41 AM  Advanced Directives  Does Patient Have a Medical Advance Directive? Yes  Type of Advance Directive Out of facility DNR (pink MOST or yellow form)  Does patient want to make changes to medical advance directive? No - Patient declined     Chief Complaint  Patient presents with   Medical management of Chronic Conditions   Nail Problem         HPI:  Pt is a 87 y.o. male seen today for medical management of chronic diseases.     Past Medical History:  Diagnosis Date   Atrial fibrillation (HCC)    persistent   on NOAC   Hypertension    Vitamin B 12 deficiency    No past surgical history on file.  No Known Allergies  Outpatient Encounter Medications as of 04/13/2023  Medication Sig   acetaminophen (TYLENOL) 500 MG tablet Take 1,000 mg by mouth every 8 (eight) hours as needed.   apixaban (ELIQUIS) 5 MG TABS tablet Take 5 mg by mouth 2 (two) times daily.   ARIPiprazole (ABILIFY) 10 MG tablet Take 10 mg by mouth daily.   busPIRone (BUSPAR) 5 MG tablet Take 5 mg by mouth 3 (three) times daily.   chlorthalidone (HYGROTON) 25 MG tablet Take 25 mg by mouth daily.   donepezil (ARICEPT) 10 MG tablet Take 1 tablet by mouth at bedtime.   furosemide (LASIX) 40 MG tablet Take 40 mg by mouth daily.   melatonin 3 MG TABS tablet Take 3 mg by mouth at bedtime.   memantine (NAMENDA) 10 MG tablet Take 10 mg by  mouth 2 (two) times daily.   Multiple Vitamins-Minerals (MULTIVITAMIN WITH MINERALS) tablet Take 1 tablet by mouth daily.   potassium chloride (KLOR-CON) 20 MEQ packet Take 40 mEq by mouth 2 (two) times daily.   potassium chloride 20 MEQ/15ML (10%) SOLN Take 15 mLs by mouth daily. (Patient not taking: Reported on 03/24/2023)   sertraline (ZOLOFT) 100 MG tablet Take 100 mg by mouth daily.   tamsulosin (FLOMAX) 0.4 MG CAPS capsule Take 0.4 mg by mouth.   Vibegron (GEMTESA) 75 MG TABS Take 1 tablet by mouth daily.   Zinc Oxide (TRIPLE PASTE) 12.8 % ointment Apply 1 Application topically as needed for irritation. Every shift.   No facility-administered encounter medications on file as of 04/13/2023.    Review of Systems  Immunization History  Administered Date(s) Administered   Influenza Inj Mdck Quad Pf 01/14/2019   Influenza,inj,Quad PF,6+ Mos 03/21/2020   Influenza-Unspecified 01/18/2016, 01/16/2017, 01/29/2018, 02/18/2023   Moderna Sars-Covid-2 Vaccination 12/01/2019, 12/29/2019   Td 10/26/2017   Td (Adult),5 Lf Tetanus Toxid, Preservative Free 10/26/2017   Td (Adult),unspecified 05/01/2008   Tdap 05/01/2008   Zoster, Live 04/28/2009   Pertinent  Health Maintenance Due  Topic Date Due   INFLUENZA VACCINE  Completed      05/03/2022    8:10 AM 05/03/2022    8:00 PM 05/04/2022  8:35 AM 05/04/2022    8:00 PM 05/05/2022   11:00 AM  Fall Risk  (RETIRED) Patient Fall Risk Level High fall risk High fall risk High fall risk High fall risk High fall risk   Functional Status Survey:    Vitals:   04/14/23 0653  BP: 130/80  Pulse: 60  Weight: 219 lb (99.3 kg)   Body mass index is 29.7 kg/m. Physical Exam Cardiovascular:     Rate and Rhythm: Normal rate.     Pulses: Normal pulses.  Pulmonary:     Effort: Pulmonary effort is normal.  Musculoskeletal:        General: Swelling present.  Skin:    General: Skin is warm and dry.     Comments: Thick, hyperkeratotic toenails  bilaterally  Neurological:     Mental Status: He is alert.     Labs reviewed: Recent Labs    05/05/22 0515 08/03/22 1958 08/03/22 2230 08/04/22 0455 08/06/22 1018 08/12/22 0631 10/09/22 0000 03/19/23 0000 03/23/23 0000 03/30/23 0000  NA 138 139  --  139  --  140   < > 139 138 138  K 3.5 3.2*  --  3.3*   < > 3.8   < > 2.9* 3.4* 3.7  CL 104 102  --  103  --  103   < > 95* 100 97*  CO2 27 28  --  29  --  30   < > 34* 32* 32*  GLUCOSE 95 110*  --  94  --  99  --   --   --   --   BUN 25* 18  --  18  --  32*   < > 21 24* 18  CREATININE 1.34* 1.12  --  0.98  --  1.17   < > 1.6* 1.6* 1.5*  CALCIUM 8.6* 8.5*  --  8.8*  --  9.0   < > 9.3 9.3 9.1  MG 1.9  --  2.2  --   --  2.3  --   --   --   --    < > = values in this interval not displayed.   Recent Labs    04/28/22 1951 10/09/22 0000 02/16/23 0000  AST 23 13* 18  ALT 15 12 13   ALKPHOS 73 81 100  BILITOT 0.7  --   --   PROT 7.0  --   --   ALBUMIN 3.3* 3.5 3.9   Recent Labs    04/28/22 1951 04/30/22 0557 08/03/22 1958 08/04/22 0455 08/09/22 0442 10/09/22 0000 02/16/23 0000  WBC 8.0   < > 6.5 7.1 6.6 6.3 6.4  NEUTROABS 5.7  --   --   --   --   --   --   HGB 13.1   < > 11.6* 12.0* 12.2* 11.9* 12.9*  HCT 40.7   < > 35.7* 37.1* 37.4* 37* 42  MCV 83.7   < > 84.8 84.3 83.5  --   --   PLT 237   < > 188 179 220 226 233   < > = values in this interval not displayed.   Lab Results  Component Value Date   TSH 1.511 08/04/2022   No results found for: "HGBA1C" No results found for: "CHOL", "HDL", "LDLCALC", "LDLDIRECT", "TRIG", "CHOLHDL"  Significant Diagnostic Results in last 30 days:  No results found.  Assessment/Plan 1. Onychomycosis (Primary) -Consent given for treatment as described below: -Examined patient. -Continue supportive shoe gear daily. -Mycotic  toenails 9 Number of nails  bilaterally were debrided in length and girth with sterile nail nippers and dremel without incident. -Patient/POA to call should  there be question/concern in the interim.  2. Dementia with behavioral disturbance (HCC) Patient with continued behaviors and moderately redirectable. Continue abilify 10 mg, Buspar 5 mg, donepzil 10 mg, namenda 10 mg. Will trial dose reduction of antipsychotic at next routine visit.   3. Essential hypertension Well controlled on current regimen.   4. Congestive heart failure, unspecified HF chronicity, unspecified heart failure type [facility]) Patient with continued leg swelling. Encourage compression and elevation. Continue Lasix and potassium supplementation.   5. Atrial fibrillation, chronic (HCC) Rate controlled. No signs of bleeding. Continue eliquis    Family/ staff Communication: nursing  Labs/tests ordered:  none

## 2023-04-27 ENCOUNTER — Non-Acute Institutional Stay (SKILLED_NURSING_FACILITY): Payer: Medicare Other | Admitting: Adult Health

## 2023-04-27 ENCOUNTER — Encounter: Payer: Self-pay | Admitting: Adult Health

## 2023-04-27 DIAGNOSIS — I482 Chronic atrial fibrillation, unspecified: Secondary | ICD-10-CM | POA: Diagnosis not present

## 2023-04-27 DIAGNOSIS — L219 Seborrheic dermatitis, unspecified: Secondary | ICD-10-CM | POA: Diagnosis not present

## 2023-04-27 DIAGNOSIS — F03918 Unspecified dementia, unspecified severity, with other behavioral disturbance: Secondary | ICD-10-CM

## 2023-04-27 NOTE — Progress Notes (Unsigned)
Location:  Other [facility] Home Room Number: [place] [facility]) Place of Service:  SNF (31) Provider:  Kenard Gower, DNP, FNP-BC  Patient Care Team: Earnestine Mealing, MD as PCP - General (Family Medicine)  Extended Emergency Contact Information Primary Emergency Contact: Merry Proud Address: [address]          [place], [place] [address] Home Phone: [phone] Mobile Phone: [phone] Relation: Spouse Secondary Emergency Contact: Holden,[name] Mobile Phone: [phone] Relation: Son  Code Status:  DNR  Goals of care: Advanced Directive information    04/27/2023    4:43 PM  Advanced Directives  Does Patient Have a Medical Advance Directive? Yes  Type of Advance Directive Out of facility DNR (pink MOST or yellow form)  Does patient want to make changes to medical advance directive? No - Patient declined     Chief Complaint  Patient presents with   Acute Visit    flakey, dry rashes on face neck and scalp     HPI:  Pt is a 87 y.o. male seen today for medical management of chronic diseases.  ***   Past Medical History:  Diagnosis Date   Atrial fibrillation (HCC)    persistent   on NOAC   Hypertension    Vitamin B 12 deficiency    History reviewed. No pertinent surgical history.  No Known Allergies  Outpatient Encounter Medications as of 04/27/2023  Medication Sig   acetaminophen (TYLENOL) 500 MG tablet Take 1,000 mg by mouth every 8 (eight) hours as needed.   apixaban (ELIQUIS) 5 MG TABS tablet Take 5 mg by mouth 2 (two) times daily.   ARIPiprazole (ABILIFY) 10 MG tablet Take 10 mg by mouth daily.   busPIRone (BUSPAR) 5 MG tablet Take 5 mg by mouth 3 (three) times daily.   chlorthalidone (HYGROTON) 25 MG tablet Take 25 mg by mouth daily.   donepezil (ARICEPT) 10 MG tablet Take 1 tablet by mouth at bedtime.   furosemide (LASIX) 40 MG tablet Take 40 mg by mouth daily.   ketoconazole (NIZORAL) 2 % cream Apply 1  Application topically 2 (two) times daily.   ketoconazole (NIZORAL) 2 % shampoo Apply 1 Application topically daily.   melatonin 3 MG TABS tablet Take 3 mg by mouth at bedtime.   memantine (NAMENDA) 10 MG tablet Take 10 mg by mouth 2 (two) times daily.   Multiple Vitamins-Minerals (MULTIVITAMIN WITH MINERALS) tablet Take 1 tablet by mouth daily.   potassium chloride (KLOR-CON) 20 MEQ packet Take 40 mEq by mouth daily.   sertraline (ZOLOFT) 100 MG tablet Take 100 mg by mouth daily.   tamsulosin (FLOMAX) 0.4 MG CAPS capsule Take 0.4 mg by mouth.   Vibegron (GEMTESA) 75 MG TABS Take 1 tablet by mouth daily.   Zinc Oxide (TRIPLE PASTE) 12.8 % ointment Apply 1 Application topically as needed for irritation. Every shift.   potassium chloride 20 MEQ/15ML (10%) SOLN Take 15 mLs by mouth daily. (Patient not taking: Reported on 04/27/2023)   No facility-administered encounter medications on file as of 04/27/2023.    Review of Systems ***    Immunization History  Administered Date(s) Administered   Influenza Inj Mdck Quad Pf 01/14/2019   Influenza,inj,Quad PF,6+ Mos 03/21/2020   Influenza-Unspecified 01/18/2016, 01/16/2017, 01/29/2018, 02/18/2023   Moderna Sars-Covid-2 Vaccination 12/01/2019, 12/29/2019   Td 10/26/2017   Td (Adult),5 Lf Tetanus Toxid, Preservative Free 10/26/2017   Td (Adult),unspecified 05/01/2008   Tdap 05/01/2008   Zoster, Live 04/28/2009  Pertinent  Health Maintenance Due  Topic Date Due   INFLUENZA VACCINE  Completed      05/03/2022    8:10 AM 05/03/2022    8:00 PM 05/04/2022    8:35 AM 05/04/2022    8:00 PM 05/05/2022   11:00 AM  Fall Risk  (RETIRED) Patient Fall Risk Level High fall risk High fall risk High fall risk High fall risk High fall risk     Vitals:   04/27/23 1634  BP: 125/70  Pulse: 62  Resp: 20  Temp: 97.9 F (36.6 C)  SpO2: 93%  Weight: 222 lb 12.8 oz (101.1 kg)  Height: 6' (1.829 m)   Body mass index is 30.22 kg/m.  Physical Exam      Labs reviewed: Recent Labs    05/05/22 0515 08/03/22 1958 08/03/22 2230 08/04/22 0455 08/06/22 1018 08/12/22 0631 10/09/22 0000 03/19/23 0000 03/23/23 0000 03/30/23 0000  NA 138 139  --  139  --  140   < > 139 138 138  K 3.5 3.2*  --  3.3*   < > 3.8   < > 2.9* 3.4* 3.7  CL 104 102  --  103  --  103   < > 95* 100 97*  CO2 27 28  --  29  --  30   < > 34* 32* 32*  GLUCOSE 95 110*  --  94  --  99  --   --   --   --   BUN 25* 18  --  18  --  32*   < > 21 24* 18  CREATININE 1.34* 1.12  --  0.98  --  1.17   < > 1.6* 1.6* 1.5*  CALCIUM 8.6* 8.5*  --  8.8*  --  9.0   < > 9.3 9.3 9.1  MG 1.9  --  2.2  --   --  2.3  --   --   --   --    < > = values in this interval not displayed.   Recent Labs    04/28/22 1951 10/09/22 0000 02/16/23 0000  AST 23 13* 18  ALT 15 12 13   ALKPHOS 73 81 100  BILITOT 0.7  --   --   PROT 7.0  --   --   ALBUMIN 3.3* 3.5 3.9   Recent Labs    04/28/22 1951 04/30/22 0557 08/03/22 1958 08/04/22 0455 08/09/22 0442 10/09/22 0000 02/16/23 0000  WBC 8.0   < > 6.5 7.1 6.6 6.3 6.4  NEUTROABS 5.7  --   --   --   --   --   --   HGB 13.1   < > 11.6* 12.0* 12.2* 11.9* 12.9*  HCT 40.7   < > 35.7* 37.1* 37.4* 37* 42  MCV 83.7   < > 84.8 84.3 83.5  --   --   PLT 237   < > 188 179 220 226 233   < > = values in this interval not displayed.   Lab Results  Component Value Date   TSH 1.511 08/04/2022   No results found for: "HGBA1C" No results found for: "CHOL", "HDL", "LDLCALC", "LDLDIRECT", "TRIG", "CHOLHDL"  Significant Diagnostic Results in last 30 days:  No results found.  Assessment/Plan ***   Family/ staff Communication: Discussed plan of care with resident and charge nurse  Labs/tests ordered:     Kenard Gower, DNP, MSN, FNP-BC [facility] [phone] (Monday-Friday 8:00 a.m. -  5:00 p.m.) [phone] (after hours)

## 2023-04-28 ENCOUNTER — Non-Acute Institutional Stay: Payer: Medicare Other | Admitting: Nurse Practitioner

## 2023-04-28 ENCOUNTER — Encounter: Payer: Self-pay | Admitting: Nurse Practitioner

## 2023-04-28 DIAGNOSIS — Z0000 Encounter for general adult medical examination without abnormal findings: Secondary | ICD-10-CM

## 2023-04-28 NOTE — Progress Notes (Signed)
 Subjective:   Jim Blake is a 87 y.o. male who presents for Medicare Annual/Subsequent preventive examination.  Visit Complete: In person at [facility]      Objective:    Today's Vitals   04/28/23 0840  BP: 125/70  Pulse: 62  Resp: 20  Temp: 97.9 F (36.6 C)  SpO2: 93%  Weight: 222 lb 12.8 oz (101.1 kg)  Height: 6' (1.829 m)  PainSc: 0-No pain   Body mass index is 30.22 kg/m.     04/28/2023    9:51 AM 04/27/2023    4:43 PM 03/24/2023   11:41 AM 02/20/2023   10:29 AM 02/10/2023   11:48 AM 01/27/2023    2:26 PM 01/21/2023   10:20 AM  Advanced Directives  Does Patient Have a Medical Advance Directive? Yes Yes Yes Yes Yes Yes No  Type of Advance Directive Out of facility DNR (pink MOST or yellow form) Out of facility DNR (pink MOST or yellow form) Out of facility DNR (pink MOST or yellow form) Out of facility DNR (pink MOST or yellow form) Out of facility DNR (pink MOST or yellow form) Out of facility DNR (pink MOST or yellow form)   Does patient want to make changes to medical advance directive? No - Patient declined No - Patient declined No - Patient declined No - Patient declined No - Patient declined No - Patient declined   Would patient like information on creating a medical advance directive?       No - Patient declined    Current Medications (verified) Outpatient Encounter Medications as of 04/28/2023  Medication Sig   acetaminophen  (TYLENOL ) 500 MG tablet Take 1,000 mg by mouth every 8 (eight) hours as needed.   apixaban  (ELIQUIS ) 5 MG TABS tablet Take 5 mg by mouth 2 (two) times daily.   ARIPiprazole (ABILIFY) 10 MG tablet Take 10 mg by mouth daily.   busPIRone (BUSPAR) 5 MG tablet Take 5 mg by mouth 3 (three) times daily.   chlorthalidone (HYGROTON) 25 MG tablet Take 25 mg by mouth daily.   donepezil  (ARICEPT ) 10 MG tablet Take 1 tablet by mouth at bedtime.   furosemide  (LASIX ) 40 MG tablet Take 40 mg by mouth daily.   ketoconazole (NIZORAL) 2 % cream Apply 1  Application topically 2 (two) times daily.   ketoconazole (NIZORAL) 2 % shampoo Apply 1 Application topically daily.   melatonin 3 MG TABS tablet Take 3 mg by mouth at bedtime.   memantine (NAMENDA) 10 MG tablet Take 10 mg by mouth 2 (two) times daily.   Multiple Vitamins-Minerals (MULTIVITAMIN WITH MINERALS) tablet Take 1 tablet by mouth daily.   potassium chloride  (KLOR-CON ) 20 MEQ packet Take 40 mEq by mouth daily.   sertraline  (ZOLOFT ) 100 MG tablet Take 100 mg by mouth daily.   tamsulosin (FLOMAX) 0.4 MG CAPS capsule Take 0.4 mg by mouth.   Vibegron (GEMTESA) 75 MG TABS Take 1 tablet by mouth daily.   Zinc Oxide (TRIPLE PASTE) 12.8 % ointment Apply 1 Application topically as needed for irritation. Every shift.   [DISCONTINUED] potassium chloride  20 MEQ/15ML (10%) SOLN Take 15 mLs by mouth daily. (Patient not taking: Reported on 04/27/2023)   No facility-administered encounter medications on file as of 04/28/2023.    Allergies (verified) Patient has no known allergies.   History: Past Medical History:  Diagnosis Date   Atrial fibrillation (HCC)    persistent   on NOAC   Hypertension    Vitamin B 12 deficiency  History reviewed. No pertinent surgical history. History reviewed. No pertinent family history. Social History   Socioeconomic History   Marital status: Married    Spouse name: Not on file   Number of children: Not on file   Years of education: Not on file   Highest education level: Not on file  Occupational History   Not on file  Tobacco Use   Smoking status: Some Days    Types: Cigars   Smokeless tobacco: Never  Vaping Use   Vaping status: Never Used  Substance and Sexual Activity   Alcohol use: Never   Drug use: Never   Sexual activity: Not Currently  Other Topics Concern   Not on file  Social History Narrative   Not on file   Social Drivers of Health   Financial Resource Strain: Patient Declined (04/16/2022)   Received from [facility], [facility]   Overall Financial Resource Strain (CARDIA)    Difficulty of Paying Living Expenses: Patient declined  Food Insecurity: No Food Insecurity (08/04/2022)   Hunger Vital Sign    Worried About Running Out of Food in the Last Year: Never true    Ran Out of Food in the Last Year: Never true  Transportation Needs: No Transportation Needs (08/04/2022)   PRAPARE - Administrator, Civil Service (Medical): No    Lack of Transportation (Non-Medical): No  Physical Activity: Not on file  Stress: Not on file  Social Connections: Not on file    Tobacco Counseling Ready to quit: Not Answered Counseling given: Not Answered   Clinical Intake:  Pre-visit preparation completed: Yes  Pain Score: 0-No pain     BMI - recorded: 30 Nutritional Status: BMI > 30  Obese Nutritional Risks: Other (Comment) (due to dementia)  How often do you need to have someone help you when you read instructions, pamphlets, or other written materials from your doctor or pharmacy?: 5 - Always         Activities of Daily Living    04/28/2023    8:41 AM 04/28/2023    8:39 AM  In your present state of health, do you have any difficulty performing the following activities:  Hearing?  1  Vision?  0  Difficulty concentrating or making decisions?  1  Walking or climbing stairs?  1  Dressing or bathing?  1  Doing errands, shopping?  1  Preparing Food and eating ?  Y  Using the Toilet?  Y  In the past six months, have you accidently leaked urine?  Y  Do you have problems with loss of bowel control? Y   Managing your Medications?  Y  Managing your Finances?  Y  Housekeeping or managing your Housekeeping?  Y    Patient Care Team: Abdul Fine, MD as PCP - General (Family Medicine)  Indicate any recent Medical Services you may have received from other than [facility] providers in the past year (date may be approximate).     Assessment:   This is a routine  wellness examination for Jim Blake.  Hearing/Vision screen No results found.   Goals Addressed   None    Depression Screen     No data to display          Fall Risk     No data to display          MEDICARE RISK AT HOME: Medicare Risk at Home Any stairs in or around the home?: No Home free  of loose throw rugs in walkways, pet beds, electrical cords, etc?: Yes Adequate lighting in your home to reduce risk of falls?: Yes Life alert?: No Use of a cane, walker or w/c?: Yes Grab bars in the bathroom?: Yes Shower chair or bench in shower?: Yes Elevated toilet seat or a handicapped toilet?: Yes  TIMED UP AND GO:  Was the test performed?  No    Cognitive Function:        Immunizations Immunization History  Administered Date(s) Administered   Influenza Inj Mdck Quad Pf 01/14/2019   Influenza,inj,Quad PF,6+ Mos 03/21/2020   Influenza-Unspecified 01/18/2016, 01/16/2017, 01/29/2018, 02/18/2023   Moderna Sars-Covid-2 Vaccination 12/01/2019, 12/29/2019   Td 10/26/2017   Td (Adult),5 Lf Tetanus Toxid, Preservative Free 10/26/2017   Td (Adult),unspecified 05/01/2008   Tdap 05/01/2008   Zoster, Live 04/28/2009    TDAP status: Up to date  Flu Vaccine status: Up to date  Pneumococcal vaccine status: Due, Education has been provided regarding the importance of this vaccine. Advised may receive this vaccine at local pharmacy or Health Dept. Aware to provide a copy of the vaccination record if obtained from local pharmacy or Health Dept. Verbalized acceptance and understanding.  Covid-19 vaccine status: Information provided on how to obtain vaccines.   Qualifies for Shingles Vaccine? Yes   Zostavax completed Yes   Shingrix Completed?: No.    Education has been provided regarding the importance of this vaccine. Patient has been advised to call insurance company to determine out of pocket expense if they have not yet received this vaccine. Advised may also receive vaccine at  local pharmacy or Health Dept. Verbalized acceptance and understanding.  Screening Tests Health Maintenance  Topic Date Due   Pneumonia Vaccine 102+ Years old (1 of 2 - PCV) Never done   Zoster Vaccines- Shingrix (1 of 2) 06/21/1987   COVID-19 Vaccine (3 - 2024-25 season) 12/28/2022   Medicare Annual Wellness (AWV)  04/27/2024   DTaP/Tdap/Td (5 - Td or Tdap) 10/27/2027   INFLUENZA VACCINE  Completed   HPV VACCINES  Aged Out    Health Maintenance  Health Maintenance Due  Topic Date Due   Pneumonia Vaccine 68+ Years old (1 of 2 - PCV) Never done   Zoster Vaccines- Shingrix (1 of 2) 06/21/1987   COVID-19 Vaccine (3 - 2024-25 season) 12/28/2022    Colorectal cancer screening: No longer required.   Lung Cancer Screening: (Low Dose CT Chest recommended if Age 39-80 years, 20 pack-year currently smoking OR have quit w/in 15years.) does not qualify.   Lung Cancer Screening Referral: na  Additional Screening:  Hepatitis C Screening: does not qualify; Completed na  Vision Screening: Recommended annual ophthalmology exams for early detection of glaucoma and other disorders of the eye. Is the patient up to date with their annual eye exam?  Yes  Who is the provider or what is the name of the office in which the patient attends annual eye exams? onsite If pt is not established with a provider, would they like to be referred to a provider to establish care? No .   Dental Screening: Recommended annual dental exams for proper oral hygiene    Community Resource Referral / Chronic Care Management: CRR required this visit?  No   CCM required this visit?  No     Plan:     I have personally reviewed and noted the following in the patient's chart:   Medical and social history Use of alcohol, tobacco or illicit drugs  Current  medications and supplements including opioid prescriptions. Patient is not currently taking opioid prescriptions. Functional ability and status Nutritional  status Physical activity Advanced directives List of other physicians Hospitalizations, surgeries, and ER visits in previous 12 months Vitals Screenings to include cognitive, depression, and falls Referrals and appointments  In addition, I have reviewed and discussed with patient certain preventive protocols, quality metrics, and best practice recommendations. A written personalized care plan for preventive services as well as general preventive health recommendations were provided to patient.     Harlene MARLA An, NP   04/28/2023

## 2023-04-28 NOTE — Patient Instructions (Signed)
  Mr. Jim Blake , Thank you for taking time to come for your Medicare Wellness Visit. I appreciate your ongoing commitment to your health goals. Please review the following plan we discussed and let me know if I can assist you in the future.   This is a list of the screening recommended for you and due dates:  Health Maintenance  Topic Date Due   Pneumonia Vaccine (1 of 2 - PCV) Never done   Zoster (Shingles) Vaccine (1 of 2) 06/21/1987   COVID-19 Vaccine (3 - 2024-25 season) 12/28/2022   Medicare Annual Wellness Visit  04/27/2024   DTaP/Tdap/Td vaccine (5 - Td or Tdap) 10/27/2027   Flu Shot  Completed   HPV Vaccine  Aged Out   Due for shingles and prevnar 20 vaccine.

## 2023-05-18 ENCOUNTER — Non-Acute Institutional Stay (SKILLED_NURSING_FACILITY): Payer: Medicare Other | Admitting: Student

## 2023-05-18 DIAGNOSIS — F03918 Unspecified dementia, unspecified severity, with other behavioral disturbance: Secondary | ICD-10-CM

## 2023-05-18 DIAGNOSIS — I482 Chronic atrial fibrillation, unspecified: Secondary | ICD-10-CM | POA: Diagnosis not present

## 2023-05-18 DIAGNOSIS — N1831 Chronic kidney disease, stage 3a: Secondary | ICD-10-CM

## 2023-05-18 DIAGNOSIS — I509 Heart failure, unspecified: Secondary | ICD-10-CM | POA: Diagnosis not present

## 2023-05-18 DIAGNOSIS — E876 Hypokalemia: Secondary | ICD-10-CM

## 2023-05-18 NOTE — Progress Notes (Unsigned)
Location:  Other Nursing Home Room Number: [facility] [address] Place of Service:  SNF [phone]) Provider:  Ander Gaster, Benetta Spar, MD  Patient Care Team: Earnestine Mealing, MD as PCP - General [facility] Medicine)  Extended Emergency Contact Information Primary Emergency Contact: Merry Proud Address: [address]          [place], [place] [address] Home Phone: [phone] Mobile Phone: [phone] Relation: Spouse Secondary Emergency Contact: Ekstein,[name] Mobile Phone: [phone] Relation: Son  Code Status:  DNR Goals of care: Advanced Directive information    04/28/2023    9:51 AM  Advanced Directives  Does Patient Have a Medical Advance Directive? Yes  Type of Advance Directive Out of facility DNR (pink MOST or yellow form)  Does patient want to make changes to medical advance directive? No - Patient declined     Chief Complaint  Patient presents with   Medical Management of Chronic Conditions    HPI:  Pt is a 88 y.o. male seen today for medical management of chronic diseases.    Patient states he is doing fine. He asks for a hug. He denies pain at this time. Denies shortness of breath. He doesn't know where he is or where his wife is.Easily distracted.   Per nursing: he eats really well. Behaviors currently improved on regimen. He is periodically incontinent of urine.  Past Medical History:  Diagnosis Date   Atrial fibrillation (HCC)    persistent   on NOAC   Hypertension    Vitamin B 12 deficiency    No past surgical history on file.  No Known Allergies  Outpatient Encounter Medications as of 05/18/2023  Medication Sig   acetaminophen (TYLENOL) 500 MG tablet Take 1,000 mg by mouth every 8 (eight) hours as needed.   apixaban (ELIQUIS) 5 MG TABS tablet Take 5 mg by mouth 2 (two) times daily.   ARIPiprazole (ABILIFY) 10 MG tablet Take 10 mg by mouth daily.   busPIRone (BUSPAR) 5 MG tablet Take 5 mg by mouth 3 (three) times daily.   chlorthalidone  (HYGROTON) 25 MG tablet Take 25 mg by mouth daily.   donepezil (ARICEPT) 10 MG tablet Take 1 tablet by mouth at bedtime.   furosemide (LASIX) 40 MG tablet Take 40 mg by mouth daily.   ketoconazole (NIZORAL) 2 % cream Apply 1 Application topically 2 (two) times daily.   ketoconazole (NIZORAL) 2 % shampoo Apply 1 Application topically daily.   melatonin 3 MG TABS tablet Take 3 mg by mouth at bedtime.   memantine (NAMENDA) 10 MG tablet Take 10 mg by mouth 2 (two) times daily.   Multiple Vitamins-Minerals (MULTIVITAMIN WITH MINERALS) tablet Take 1 tablet by mouth daily.   potassium chloride (KLOR-CON) 20 MEQ packet Take 40 mEq by mouth daily.   sertraline (ZOLOFT) 100 MG tablet Take 100 mg by mouth daily.   tamsulosin (FLOMAX) 0.4 MG CAPS capsule Take 0.4 mg by mouth.   Vibegron (GEMTESA) 75 MG TABS Take 1 tablet by mouth daily.   Zinc Oxide (TRIPLE PASTE) 12.8 % ointment Apply 1 Application topically as needed for irritation. Every shift.   No facility-administered encounter medications on file as of 05/18/2023.    Review of Systems  Immunization History  Administered Date(s) Administered   Influenza Inj Mdck Quad Pf 01/14/2019   Influenza,inj,Quad PF,6+ Mos 03/21/2020   Influenza-Unspecified 01/18/2016, 01/16/2017, 01/29/2018, 02/18/2023   Moderna Sars-Covid-2 Vaccination 12/01/2019, 12/29/2019   Td 10/26/2017   Td (Adult),5 Lf Tetanus Toxid, Preservative Free 10/26/2017  Td (Adult),unspecified 05/01/2008   Tdap 05/01/2008   Zoster, Live 04/28/2009   Pertinent  Health Maintenance Due  Topic Date Due   INFLUENZA VACCINE  Completed      05/03/2022    8:10 AM 05/03/2022    8:00 PM 05/04/2022    8:35 AM 05/04/2022    8:00 PM 05/05/2022   11:00 AM  Fall Risk  (RETIRED) Patient Fall Risk Level High fall risk High fall risk High fall risk High fall risk High fall risk   Functional Status Survey:    Vitals:   05/18/23 1948  BP: 134/69  Resp: 18  Temp: (!) 97.3 F (36.3 C)  SpO2: 96%   Weight: 225 lb 9.6 oz (102.3 kg)   Body mass index is 30.6 kg/m. Physical Exam Cardiovascular:     Rate and Rhythm: Normal rate.     Pulses: Normal pulses.  Pulmonary:     Effort: Pulmonary effort is normal.     Breath sounds: Normal breath sounds.  Abdominal:     General: Abdomen is flat.  Musculoskeletal:        General: No swelling.  Skin:    General: Skin is warm.  Neurological:     Mental Status: He is alert. Mental status is at baseline. He is disoriented.     Labs reviewed: Recent Labs    08/03/22 1958 08/03/22 2230 08/04/22 0455 08/06/22 1018 08/12/22 0631 10/09/22 0000 03/19/23 0000 03/23/23 0000 03/30/23 0000  NA 139  --  139  --  140   < > 139 138 138  K 3.2*  --  3.3*   < > 3.8   < > 2.9* 3.4* 3.7  CL 102  --  103  --  103   < > 95* 100 97*  CO2 28  --  29  --  30   < > 34* 32* 32*  GLUCOSE 110*  --  94  --  99  --   --   --   --   BUN 18  --  18  --  32*   < > 21 24* 18  CREATININE 1.12  --  0.98  --  1.17   < > 1.6* 1.6* 1.5*  CALCIUM 8.5*  --  8.8*  --  9.0   < > 9.3 9.3 9.1  MG  --  2.2  --   --  2.3  --   --   --   --    < > = values in this interval not displayed.   Recent Labs    10/09/22 0000 02/16/23 0000  AST 13* 18  ALT 12 13  ALKPHOS 81 100  ALBUMIN 3.5 3.9   Recent Labs    08/03/22 1958 08/04/22 0455 08/09/22 0442 10/09/22 0000 02/16/23 0000  WBC 6.5 7.1 6.6 6.3 6.4  HGB 11.6* 12.0* 12.2* 11.9* 12.9*  HCT 35.7* 37.1* 37.4* 37* 42  MCV 84.8 84.3 83.5  --   --   PLT 188 179 220 226 233   Lab Results  Component Value Date   TSH 1.511 08/04/2022   No results found for: "HGBA1C" No results found for: "CHOL", "HDL", "LDLCALC", "LDLDIRECT", "TRIG", "CHOLHDL"  Significant Diagnostic Results in last 30 days:  No results found.  Assessment/Plan Congestive heart failure, unspecified HF chronicity, unspecified heart failure type (HCC), Chronic  Dementia with behavioral disturbance (HCC), Chronic  Atrial fibrillation,  chronic (HCC), Chronic  Stage 3a chronic kidney disease (HCC), Chronic  Hypokalemia  Patient appears euvolemic on exam. Weight stable at this time. BMP to evaluate renal function and Kcl. If it appears he has contraction from diuresis, will plan to change furosemide to prn. Supplement electrolytes as indicated. No acute decline in memory at this time. Disoriented. Periodic moods and hypersexual behavior. Continue namenda, buspar, abilify, and sertraline. Attempt titration q3 months. Rate well-controlled without signs of bleeding. Dose adjustment of eliquis as indicated for Cr >1.1  Family/ staff Communication: nursing  Labs/tests ordered:  bmp

## 2023-05-21 ENCOUNTER — Encounter: Payer: Self-pay | Admitting: Student

## 2023-05-21 DIAGNOSIS — I509 Heart failure, unspecified: Secondary | ICD-10-CM | POA: Insufficient documentation

## 2023-05-21 LAB — BASIC METABOLIC PANEL
BUN: 20 (ref 4–21)
CO2: 33 — AB (ref 13–22)
Chloride: 97 — AB (ref 99–108)
Creatinine: 1.6 — AB (ref 0.6–1.3)
Glucose: 92
Potassium: 3.4 meq/L — AB (ref 3.5–5.1)
Sodium: 137 (ref 137–147)

## 2023-05-21 LAB — COMPREHENSIVE METABOLIC PANEL
Calcium: 9.2 (ref 8.7–10.7)
eGFR: 42

## 2023-07-09 ENCOUNTER — Encounter: Payer: Self-pay | Admitting: Nurse Practitioner

## 2023-07-09 ENCOUNTER — Non-Acute Institutional Stay (SKILLED_NURSING_FACILITY): Admitting: Nurse Practitioner

## 2023-07-09 DIAGNOSIS — I10 Essential (primary) hypertension: Secondary | ICD-10-CM

## 2023-07-09 DIAGNOSIS — N401 Enlarged prostate with lower urinary tract symptoms: Secondary | ICD-10-CM

## 2023-07-09 DIAGNOSIS — F3341 Major depressive disorder, recurrent, in partial remission: Secondary | ICD-10-CM

## 2023-07-09 DIAGNOSIS — F03918 Unspecified dementia, unspecified severity, with other behavioral disturbance: Secondary | ICD-10-CM | POA: Diagnosis not present

## 2023-07-09 DIAGNOSIS — I509 Heart failure, unspecified: Secondary | ICD-10-CM | POA: Diagnosis not present

## 2023-07-09 DIAGNOSIS — N3281 Overactive bladder: Secondary | ICD-10-CM | POA: Insufficient documentation

## 2023-07-09 DIAGNOSIS — R350 Frequency of micturition: Secondary | ICD-10-CM

## 2023-07-09 DIAGNOSIS — N1831 Chronic kidney disease, stage 3a: Secondary | ICD-10-CM | POA: Diagnosis not present

## 2023-07-09 DIAGNOSIS — I480 Paroxysmal atrial fibrillation: Secondary | ICD-10-CM

## 2023-07-09 DIAGNOSIS — F419 Anxiety disorder, unspecified: Secondary | ICD-10-CM

## 2023-07-09 DIAGNOSIS — I482 Chronic atrial fibrillation, unspecified: Secondary | ICD-10-CM | POA: Diagnosis not present

## 2023-07-09 NOTE — Assessment & Plan Note (Signed)
 Has improved on gemtesa. Will continue current regimen.

## 2023-07-09 NOTE — Assessment & Plan Note (Signed)
Stable on flomax.   

## 2023-07-09 NOTE — Assessment & Plan Note (Signed)
Stable on current regimen. Will continue. 

## 2023-07-09 NOTE — Assessment & Plan Note (Signed)
?   Rate controlled, continues on eliquis

## 2023-07-09 NOTE — Progress Notes (Signed)
 Location:  Other [facility].  Nursing Home Room Number: [facility] [address] Place of Service:  SNF [phone]) Abbey Chatters, NP  PCP: Earnestine Mealing, MD  Patient Care Team: Earnestine Mealing, MD as PCP - General [facility] Medicine)  Extended Emergency Contact Information Primary Emergency Contact: Merry Proud Address: [address]          [place], [place] [address] Home Phone: [phone] Mobile Phone: [phone] Relation: Spouse Secondary Emergency Contact: Poudrier,[name] Mobile Phone: [phone] Relation: Son  Goals of care: Advanced Directive information    04/28/2023    9:51 AM  Advanced Directives  Does Patient Have a Medical Advance Directive? Yes  Type of Advance Directive Out of facility DNR (pink MOST or yellow form)  Does patient want to make changes to medical advance directive? No - Patient declined     Chief Complaint  Patient presents with   Medical Management of Chronic Issues    Medical Management of Chronic Issues.     HPI:  Pt is a 88 y.o. male seen today for medical management of chronic disease.  Pt with hx of dementia, a fib, htn, CHF, depression and anxiety and OAB.  He has been doing well per nursing.  He eats well.  Weights stable.  No pain No increase in anxiety or depression.  No increase in LE edema.    Past Medical History:  Diagnosis Date   Atrial fibrillation (HCC)    persistent   on NOAC   Hypertension    Vitamin B 12 deficiency    History reviewed. No pertinent surgical history.  No Known Allergies  Outpatient Encounter Medications as of 07/09/2023  Medication Sig   acetaminophen (TYLENOL) 500 MG tablet Take 1,000 mg by mouth every 8 (eight) hours as needed.   apixaban (ELIQUIS) 5 MG TABS tablet Take 5 mg by mouth 2 (two) times daily.   ARIPiprazole (ABILIFY) 10 MG tablet Take 10 mg by mouth daily.   busPIRone (BUSPAR) 5 MG tablet Take 5 mg by mouth 3 (three) times daily.   donepezil (ARICEPT) 10 MG tablet Take 1  tablet by mouth at bedtime.   furosemide (LASIX) 40 MG tablet Take 40 mg by mouth daily.   ketoconazole (NIZORAL) 2 % shampoo Apply 1 Application topically daily. Wed and Sun   melatonin 3 MG TABS tablet Take 3 mg by mouth at bedtime.   memantine (NAMENDA) 10 MG tablet Take 10 mg by mouth 2 (two) times daily.   Multiple Vitamins-Minerals (MULTIVITAMIN WITH MINERALS) tablet Take 1 tablet by mouth daily.   potassium chloride (KLOR-CON) 20 MEQ packet Take 40 mEq by mouth daily.   sertraline (ZOLOFT) 100 MG tablet Take 100 mg by mouth daily.   tamsulosin (FLOMAX) 0.4 MG CAPS capsule Take 0.4 mg by mouth.   Vibegron (GEMTESA) 75 MG TABS Take 1 tablet by mouth daily.   Zinc Oxide (TRIPLE PASTE) 12.8 % ointment Apply 1 Application topically as needed for irritation. Every shift.   [DISCONTINUED] chlorthalidone (HYGROTON) 25 MG tablet Take 25 mg by mouth daily. (Patient not taking: Reported on 07/09/2023)   [DISCONTINUED] ketoconazole (NIZORAL) 2 % cream Apply 1 Application topically 2 (two) times daily. (Patient not taking: Reported on 07/09/2023)   No facility-administered encounter medications on file as of 07/09/2023.    Review of Systems  Unable to perform ROS: Dementia     Immunization History  Administered Date(s) Administered   Influenza Inj Mdck Quad Pf 01/14/2019   Influenza,inj,Quad PF,6+ Mos 03/21/2020  Influenza-Unspecified 01/18/2016, 01/16/2017, 01/29/2018, 02/18/2023   Moderna Sars-Covid-2 Vaccination 12/01/2019, 12/29/2019   Td 10/26/2017   Td (Adult),5 Lf Tetanus Toxid, Preservative Free 10/26/2017   Td (Adult),unspecified 05/01/2008   Tdap 05/01/2008   Zoster, Live 04/28/2009   Pertinent  Health Maintenance Due  Topic Date Due   INFLUENZA VACCINE  Completed      05/03/2022    8:10 AM 05/03/2022    8:00 PM 05/04/2022    8:35 AM 05/04/2022    8:00 PM 05/05/2022   11:00 AM  Fall Risk  (RETIRED) Patient Fall Risk Level High fall risk High fall risk High fall risk High fall  risk High fall risk   Functional Status Survey:    Vitals:   07/09/23 1245 07/09/23 1252  BP: (!) 183/73 (!) 159/80  Pulse: (!) 53   Resp: 18   Temp: (!) 97.3 F (36.3 C)   SpO2: 96%   Weight: 228 lb (103.4 kg)   Height: 6' (1.829 m)    Body mass index is 30.92 kg/m. Physical Exam Constitutional:      General: He is not in acute distress.    Appearance: He is well-developed. He is not diaphoretic.  HENT:     Head: Normocephalic and atraumatic.     Right Ear: External ear normal.     Left Ear: External ear normal.     Mouth/Throat:     Pharynx: No oropharyngeal exudate.  Eyes:     Conjunctiva/sclera: Conjunctivae normal.     Pupils: Pupils are equal, round, and reactive to light.  Cardiovascular:     Rate and Rhythm: Normal rate and regular rhythm.     Heart sounds: Normal heart sounds.  Pulmonary:     Effort: Pulmonary effort is normal.     Breath sounds: Normal breath sounds.  Abdominal:     General: Bowel sounds are normal.     Palpations: Abdomen is soft.  Musculoskeletal:        General: No tenderness.     Cervical back: Normal range of motion and neck supple.     Right lower leg: No edema.     Left lower leg: No edema.  Skin:    General: Skin is warm and dry.  Neurological:     Mental Status: He is alert and oriented to person, place, and time.     Motor: Weakness present.     Gait: Gait abnormal.  Psychiatric:        Mood and Affect: Mood normal.     Labs reviewed: Recent Labs    08/03/22 1958 08/03/22 2230 08/04/22 0455 08/06/22 1018 08/12/22 0631 10/09/22 0000 03/23/23 0000 03/30/23 0000 05/21/23 0000  NA 139  --  139  --  140   < > 138 138 137  K 3.2*  --  3.3*   < > 3.8   < > 3.4* 3.7 3.4*  CL 102  --  103  --  103   < > 100 97* 97*  CO2 28  --  29  --  30   < > 32* 32* 33*  GLUCOSE 110*  --  94  --  99  --   --   --   --   BUN 18  --  18  --  32*   < > 24* 18 20  CREATININE 1.12  --  0.98  --  1.17   < > 1.6* 1.5* 1.6*  CALCIUM  8.5*  --  8.8*  --  9.0   < > 9.3 9.1 9.2  MG  --  2.2  --   --  2.3  --   --   --   --    < > = values in this interval not displayed.   Recent Labs    10/09/22 0000 02/16/23 0000  AST 13* 18  ALT 12 13  ALKPHOS 81 100  ALBUMIN 3.5 3.9   Recent Labs    08/03/22 1958 08/04/22 0455 08/09/22 0442 10/09/22 0000 02/16/23 0000  WBC 6.5 7.1 6.6 6.3 6.4  HGB 11.6* 12.0* 12.2* 11.9* 12.9*  HCT 35.7* 37.1* 37.4* 37* 42  MCV 84.8 84.3 83.5  --   --   PLT 188 179 220 226 233   Lab Results  Component Value Date   TSH 1.511 08/04/2022   No results found for: "HGBA1C" No results found for: "CHOL", "HDL", "LDLCALC", "LDLDIRECT", "TRIG", "CHOLHDL"  Significant Diagnostic Results in last 30 days:  No results found.  Assessment/Plan Anxiety Doing well on current regimen. Continues on buspar and zoloft   Atrial fibrillation, chronic (HCC) Rate controlled, continues on eliquis   Dementia with behavioral disturbance (HCC) Stable, no acute changes in cognitive or functional status, continue supportive care. Behaviors are stable on current regimen  Depression Stable on current regimen. Will continue.   Stage 3a chronic kidney disease (HCC) Chronic and stable Encourage proper hydration Follow metabolic panel Avoid nephrotoxic meds (NSAIDS)  Benign prostatic hyperplasia with urinary frequency Stable on flomax  Congestive heart failure, unspecified HF chronicity, unspecified heart failure type (HCC) Euvolemic, continues on lasix  Overactive bladder Has improved on gemtesa. Will continue current regimen.   Essential hypertension Blood pressure elevated since stopping chlorthalidone Will add losartan 25 mg daily at have staff monitor BP three times weekly Follow up BMP in 1 week      Rochele Lueck K. [facility]ag[facility] [phone]

## 2023-07-09 NOTE — Assessment & Plan Note (Signed)
 Stable, no acute changes in cognitive or functional status, continue supportive care. Behaviors are stable on current regimen

## 2023-07-09 NOTE — Assessment & Plan Note (Signed)
 Blood pressure elevated since stopping chlorthalidone Will add losartan 25 mg daily at have staff monitor BP three times weekly Follow up BMP in 1 week

## 2023-07-09 NOTE — Assessment & Plan Note (Signed)
 Euvolemic, continues on lasix

## 2023-07-09 NOTE — Assessment & Plan Note (Signed)
 Doing well on current regimen. Continues on buspar and zoloft

## 2023-07-09 NOTE — Assessment & Plan Note (Signed)
 Chronic and stable Encourage proper hydration Follow metabolic panel Avoid nephrotoxic meds (NSAIDS)

## 2023-07-16 DIAGNOSIS — B079 Viral wart, unspecified: Secondary | ICD-10-CM | POA: Diagnosis not present

## 2023-07-16 DIAGNOSIS — I10 Essential (primary) hypertension: Secondary | ICD-10-CM | POA: Diagnosis not present

## 2023-07-16 DIAGNOSIS — H2512 Age-related nuclear cataract, left eye: Secondary | ICD-10-CM | POA: Diagnosis not present

## 2023-07-16 DIAGNOSIS — Z961 Presence of intraocular lens: Secondary | ICD-10-CM | POA: Diagnosis not present

## 2023-07-16 LAB — COMPREHENSIVE METABOLIC PANEL WITH GFR
Calcium: 8.8 (ref 8.7–10.7)
eGFR: 48

## 2023-07-16 LAB — BASIC METABOLIC PANEL WITH GFR
BUN: 18 (ref 4–21)
CO2: 29 — AB (ref 13–22)
Chloride: 101 (ref 99–108)
Creatinine: 1.4 — AB (ref 0.6–1.3)
Glucose: 73
Potassium: 3.7 meq/L (ref 3.5–5.1)
Sodium: 138 (ref 137–147)

## 2023-08-03 DIAGNOSIS — B351 Tinea unguium: Secondary | ICD-10-CM | POA: Diagnosis not present

## 2023-08-03 DIAGNOSIS — I7091 Generalized atherosclerosis: Secondary | ICD-10-CM | POA: Diagnosis not present

## 2023-08-06 DIAGNOSIS — D649 Anemia, unspecified: Secondary | ICD-10-CM | POA: Diagnosis not present

## 2023-08-06 LAB — CBC AND DIFFERENTIAL
HCT: 33 — AB (ref 41–53)
Hemoglobin: 10.5 — AB (ref 13.5–17.5)
Neutrophils Absolute: 4202
Platelets: 209 10*3/uL (ref 150–400)
WBC: 6.8

## 2023-08-06 LAB — CBC: RBC: 3.82 — AB (ref 3.87–5.11)

## 2023-08-10 ENCOUNTER — Non-Acute Institutional Stay (SKILLED_NURSING_FACILITY): Payer: Self-pay | Admitting: Student

## 2023-08-10 ENCOUNTER — Encounter: Payer: Self-pay | Admitting: Student

## 2023-08-10 DIAGNOSIS — R400 Somnolence: Secondary | ICD-10-CM

## 2023-08-10 NOTE — Progress Notes (Signed)
 Location:  Other [facility].  Nursing Home Room Number: [facility] [address] Place of Service:  SNF [phone]) Provider:  Valrie Gehrig, MD  Patient Care Team: Valrie Gehrig, MD as PCP - General [facility] Medicine)  Extended Emergency Contact Information Primary Emergency Contact: Lacey Pian Address: [address]          [place], [place] [address] Home Phone: [phone] Mobile Phone: [phone] Relation: Spouse Secondary Emergency Contact: Priestly,[name] Mobile Phone: [phone] Relation: Son  Code Status:  DNR Goals of care: Advanced Directive information    08/10/2023    2:43 PM  Advanced Directives  Does Patient Have a Medical Advance Directive? Yes  Type of Advance Directive Out of facility DNR (pink MOST or yellow form)  Does patient want to make changes to medical advance directive? No - Patient declined     Chief Complaint  Patient presents with   Fatigue    Weakness    HPI:  Pt is a 88 y.o. male seen today for an acute visit for Weakness.  History of Present Illness The patient presents with excessive sleepiness.  He experiences excessive sleepiness throughout the day, feeling sleepy all day and indicating that he might continue to sleep if not disturbed. The onset and duration of this symptom are not specified. No significant swelling, fever, cough, or pain is reported.  Physical Exam CHEST: Lungs clear to auscultation on the left, decreased breath sounds on the right.  Assessment and Plan  Past Medical History:  Diagnosis Date   Atrial fibrillation (HCC)    persistent   on NOAC   Hypertension    Vitamin B 12 deficiency    History reviewed. No pertinent surgical history.  No Known Allergies  Outpatient Encounter Medications as of 08/10/2023  Medication Sig   acetaminophen  (TYLENOL ) 500 MG tablet Take 1,000 mg by mouth every 8 (eight) hours as needed.   apixaban  (ELIQUIS ) 5 MG TABS tablet Take 5 mg by mouth 2 (two) times daily.    ARIPiprazole (ABILIFY) 10 MG tablet Take 10 mg by mouth daily.   busPIRone (BUSPAR) 5 MG tablet Take 5 mg by mouth 3 (three) times daily.   furosemide  (LASIX ) 40 MG tablet Take 40 mg by mouth daily.   ketoconazole (NIZORAL) 2 % shampoo Apply 1 Application topically daily. Wed and Sun   losartan (COZAAR) 25 MG tablet Take 25 mg by mouth daily.   melatonin 3 MG TABS tablet Take 3 mg by mouth at bedtime.   memantine (NAMENDA) 10 MG tablet Take 10 mg by mouth 2 (two) times daily.   Multiple Vitamins-Minerals (MULTIVITAMIN WITH MINERALS) tablet Take 1 tablet by mouth daily.   potassium chloride  (KLOR-CON ) 20 MEQ packet Take 40 mEq by mouth daily.   sertraline  (ZOLOFT ) 100 MG tablet Take 100 mg by mouth daily.   tamsulosin (FLOMAX) 0.4 MG CAPS capsule Take 0.4 mg by mouth.   tranexamic acid (LYSTEDA) 650 MG TABS tablet Take 1,300 mg by mouth daily as needed.   Vibegron (GEMTESA) 75 MG TABS Take 1 tablet by mouth daily.   Zinc Oxide (TRIPLE PASTE) 12.8 % ointment Apply 1 Application topically daily. Every shift.   donepezil  (ARICEPT ) 10 MG tablet Take 1 tablet by mouth at bedtime.   No facility-administered encounter medications on file as of 08/10/2023.    Review of Systems  Immunization History  Administered Date(s) Administered   Influenza Inj Mdck Quad Pf 01/14/2019   Influenza,inj,Quad PF,6+ Mos 03/21/2020   Influenza-Unspecified 01/18/2016, 01/16/2017, 01/29/2018,  02/18/2023   Moderna Covid-19 Fall Seasonal Vaccine 51yrs & older 08/07/2023   Moderna Sars-Covid-2 Vaccination 12/01/2019, 12/29/2019   Td 10/26/2017   Td (Adult),5 Lf Tetanus Toxid, Preservative Free 10/26/2017   Td (Adult),unspecified 05/01/2008   Tdap 05/01/2008   Zoster, Live 04/28/2009   Pertinent  Health Maintenance Due  Topic Date Due   INFLUENZA VACCINE  11/27/2023      05/03/2022    8:10 AM 05/03/2022    8:00 PM 05/04/2022    8:35 AM 05/04/2022    8:00 PM 05/05/2022   11:00 AM  Fall Risk  (RETIRED) Patient Fall  Risk Level High fall risk High fall risk High fall risk High fall risk High fall risk   Functional Status Survey:    Vitals:   08/10/23 1433 08/10/23 1445  BP: (!) 143/78 (!) 140/72  Pulse: 60   Temp: (!) 97.2 F (36.2 C)   SpO2: 96%   Weight: 223 lb 6.4 oz (101.3 kg)   Height: 6' (1.829 m)    Body mass index is 30.3 kg/m. Physical Exam Constitutional:      Comments: Sleeping in wheelchair  Cardiovascular:     Rate and Rhythm: Normal rate.     Pulses: Normal pulses.  Pulmonary:     Effort: Pulmonary effort is normal.  Neurological:     Mental Status: Mental status is at baseline. He is disoriented.     Labs reviewed: Recent Labs    08/12/22 0631 10/09/22 0000 03/30/23 0000 05/21/23 0000 07/16/23 0000  NA 140   < > 138 137 138  K 3.8   < > 3.7 3.4* 3.7  CL 103   < > 97* 97* 101  CO2 30   < > 32* 33* 29*  GLUCOSE 99  --   --   --   --   BUN 32*   < > 18 20 18   CREATININE 1.17   < > 1.5* 1.6* 1.4*  CALCIUM 9.0   < > 9.1 9.2 8.8  MG 2.3  --   --   --   --    < > = values in this interval not displayed.   Recent Labs    10/09/22 0000 02/16/23 0000  AST 13* 18  ALT 12 13  ALKPHOS 81 100  ALBUMIN 3.5 3.9   Recent Labs    10/09/22 0000 02/16/23 0000 08/06/23 0000  WBC 6.3 6.4 6.8  NEUTROABS  --   --  4,202.00  HGB 11.9* 12.9* 10.5*  HCT 37* 42 33*  PLT 226 233 209   Lab Results  Component Value Date   TSH 1.511 08/04/2022   No results found for: "HGBA1C" No results found for: "CHOL", "HDL", "LDLCALC", "LDLDIRECT", "TRIG", "CHOLHDL"  Significant Diagnostic Results in last 30 days:  No results found.  Assessment/Plan Excessive daytime sleepiness Excessive daytime sleepiness without associated symptoms such as snoring, apnea, or restless legs. The etiology is unclear. - Monitor sleep patterns and daytime alertness - Consider further evaluation if excessive sleepiness persists  Decreased breath sounds on the right Slightly decreased breath  sounds on the right side with clear lungs on the left. No associated symptoms such as cough, wheezing, or dyspnea. - Monitor respiratory status - Consider further evaluation if respiratory symptoms develop - consider CXR or infectious work up if further symptoms develop.   Family/ staff Communication: nursing  Labs/tests ordered:  none

## 2023-08-28 ENCOUNTER — Encounter: Payer: Self-pay | Admitting: Student

## 2023-08-28 ENCOUNTER — Non-Acute Institutional Stay (SKILLED_NURSING_FACILITY): Payer: Self-pay | Admitting: Student

## 2023-08-28 DIAGNOSIS — F03918 Unspecified dementia, unspecified severity, with other behavioral disturbance: Secondary | ICD-10-CM

## 2023-08-28 DIAGNOSIS — I509 Heart failure, unspecified: Secondary | ICD-10-CM | POA: Diagnosis not present

## 2023-08-28 DIAGNOSIS — N3281 Overactive bladder: Secondary | ICD-10-CM

## 2023-08-28 DIAGNOSIS — I482 Chronic atrial fibrillation, unspecified: Secondary | ICD-10-CM | POA: Diagnosis not present

## 2023-08-28 NOTE — Progress Notes (Unsigned)
 Location:  Other [facility].  Nursing Home Room Number: [facility] [address]A Place of Service:  SNF [phone]) Provider:  Valrie Gehrig, MD  Patient Care Team: Valrie Gehrig, MD as PCP - General [facility])  Extended Emergency Contact Information Primary Emergency Contact: Lacey Pian Address: [address]          [place], [place] [address] Home Phone: [phone] Mobile Phone: [phone] Relation: Spouse Secondary Emergency Contact: Eichorn,[name] Mobile Phone: [phone] Relation: Son  Code Status:  DNR Goals of care: Advanced Directive information    08/10/2023    2:43 PM  Advanced Directives  Does Patient Have a Medical Advance Directive? Yes  Type of Advance Directive Out of facility DNR (pink MOST or yellow form)  Does patient want to make changes to medical advance directive? No - Patient declined     Chief Complaint  Patient presents with   Medical Management of Chronic Issues    Medical Management of Chronic Issues.     HPI:  Pt is a 88 y.o. male seen today for medical management of chronic diseases.  He says he is fine and has no concerns. Denies chest pain, shortness of breath,etc. He has dementia and remains disoriented. Eating well per nursing. Weight stable.    Past Medical History:  Diagnosis Date   Atrial fibrillation (HCC)    persistent   on NOAC   Hypertension    Vitamin B 12 deficiency    History reviewed. No pertinent surgical history.  No Known Allergies  Outpatient Encounter Medications as of 08/28/2023  Medication Sig   acetaminophen  (TYLENOL ) 500 MG tablet Take 1,000 mg by mouth every 8 (eight) hours as needed.   apixaban  (ELIQUIS ) 5 MG TABS tablet Take 5 mg by mouth 2 (two) times daily.   ARIPiprazole (ABILIFY) 10 MG tablet Take 10 mg by mouth daily.   busPIRone (BUSPAR) 5 MG tablet Take 5 mg by mouth 3 (three) times daily.   furosemide  (LASIX ) 40 MG tablet Take 40 mg by mouth daily.   ketoconazole (NIZORAL) 2 % shampoo  Apply 1 Application topically daily. Wed and Sun   losartan (COZAAR) 25 MG tablet Take 25 mg by mouth daily.   melatonin 3 MG TABS tablet Take 3 mg by mouth at bedtime.   memantine (NAMENDA) 10 MG tablet Take 10 mg by mouth 2 (two) times daily.   Multiple Vitamins-Minerals (MULTIVITAMIN WITH MINERALS) tablet Take 1 tablet by mouth daily.   potassium chloride  (KLOR-CON ) 20 MEQ packet Take 40 mEq by mouth daily.   sertraline  (ZOLOFT ) 100 MG tablet Take 100 mg by mouth daily.   tamsulosin (FLOMAX) 0.4 MG CAPS capsule Take 0.4 mg by mouth.   tranexamic acid (LYSTEDA) 650 MG TABS tablet Take 650 mg by mouth daily as needed.   Vibegron (GEMTESA) 75 MG TABS Take 1 tablet by mouth daily.   Zinc Oxide (TRIPLE PASTE) 12.8 % ointment Apply 1 Application topically daily. Every shift.   [DISCONTINUED] donepezil  (ARICEPT ) 10 MG tablet Take 1 tablet by mouth at bedtime. (Patient not taking: Reported on 08/28/2023)   No facility-administered encounter medications on file as of 08/28/2023.    Review of Systems  Immunization History  Administered Date(s) Administered   Influenza Inj Mdck Quad Pf 01/14/2019   Influenza,inj,Quad PF,6+ Mos 03/21/2020   Influenza-Unspecified 01/18/2016, 01/16/2017, 01/29/2018, 02/18/2023   Moderna Covid-19 Fall Seasonal Vaccine 62yrs & older 08/07/2023   Moderna Sars-Covid-2 Vaccination 12/01/2019, 12/29/2019   Td 10/26/2017   Td (Adult),5 Lf  Tetanus Toxid, Preservative Free 10/26/2017   Td (Adult),unspecified 05/01/2008   Tdap 05/01/2008   Zoster, Live 04/28/2009   Pertinent  Health Maintenance Due  Topic Date Due   INFLUENZA VACCINE  11/27/2023      05/03/2022    8:10 AM 05/03/2022    8:00 PM 05/04/2022    8:35 AM 05/04/2022    8:00 PM 05/05/2022   11:00 AM  Fall Risk  (RETIRED) Patient Fall Risk Level High fall risk High fall risk High fall risk High fall risk High fall risk   Functional Status Survey:    Vitals:   08/28/23 0941 08/28/23 0946  BP: (!) 176/84 (!)  169/79  Pulse: 60   Resp: 16   Temp: 98 F (36.7 C)   SpO2: 95%   Weight: 222 lb 6.4 oz (100.9 kg)   Height: 6' (1.829 m)    Body mass index is 30.16 kg/m. Physical Exam Constitutional:      Appearance: Normal appearance.  Cardiovascular:     Rate and Rhythm: Normal rate and regular rhythm.     Pulses: Normal pulses.     Heart sounds: Normal heart sounds.  Pulmonary:     Effort: Pulmonary effort is normal.  Abdominal:     General: Abdomen is flat. Bowel sounds are normal.     Palpations: Abdomen is soft.  Musculoskeletal:        General: No swelling or tenderness.  Skin:    General: Skin is warm and dry.  Neurological:     Mental Status: He is alert. He is disoriented.     Comments: Wheelchair dependent  Psychiatric:        Mood and Affect: Mood normal.     Labs reviewed: Recent Labs    03/30/23 0000 05/21/23 0000 07/16/23 0000  NA 138 137 138  K 3.7 3.4* 3.7  CL 97* 97* 101  CO2 32* 33* 29*  BUN 18 20 18   CREATININE 1.5* 1.6* 1.4*  CALCIUM 9.1 9.2 8.8   Recent Labs    10/09/22 0000 02/16/23 0000  AST 13* 18  ALT 12 13  ALKPHOS 81 100  ALBUMIN 3.5 3.9   Recent Labs    10/09/22 0000 02/16/23 0000 08/06/23 0000  WBC 6.3 6.4 6.8  NEUTROABS  --   --  4,202.00  HGB 11.9* 12.9* 10.5*  HCT 37* 42 33*  PLT 226 233 209   Lab Results  Component Value Date   TSH 1.511 08/04/2022   No results found for: "HGBA1C" No results found for: "CHOL", "HDL", "LDLCALC", "LDLDIRECT", "TRIG", "CHOLHDL"  Significant Diagnostic Results in last 30 days:  No results found.  Assessment/Plan Congestive heart failure, unspecified HF chronicity, unspecified heart failure type (HCC)  Dementia with behavioral disturbance (HCC)  Overactive bladder  Atrial fibrillation, chronic (HCC) GDR of psychotropic medications with discontinuation of donepezil . Will continue to monitor for opportunities to decrease dosing of additional psychotropic medications in the future such  as namenda, abilify, etc. Rate well-controlled. No signs of bleeding. BP slightly elevated at this time. Will plan to increase losartan to 50 mg daily. Continue gemtesa for overactive bladder. Appears euvolemic at this time.   Family/ staff Communication: nursing  Labs/tests ordered:  none

## 2023-08-29 ENCOUNTER — Encounter: Payer: Self-pay | Admitting: Student

## 2023-10-02 DIAGNOSIS — I160 Hypertensive urgency: Secondary | ICD-10-CM | POA: Diagnosis not present

## 2023-10-02 DIAGNOSIS — M6281 Muscle weakness (generalized): Secondary | ICD-10-CM | POA: Diagnosis not present

## 2023-10-02 DIAGNOSIS — Z9181 History of falling: Secondary | ICD-10-CM | POA: Diagnosis not present

## 2023-10-04 DIAGNOSIS — Z9181 History of falling: Secondary | ICD-10-CM | POA: Diagnosis not present

## 2023-10-04 DIAGNOSIS — I160 Hypertensive urgency: Secondary | ICD-10-CM | POA: Diagnosis not present

## 2023-10-04 DIAGNOSIS — M6281 Muscle weakness (generalized): Secondary | ICD-10-CM | POA: Diagnosis not present

## 2023-10-06 DIAGNOSIS — I160 Hypertensive urgency: Secondary | ICD-10-CM | POA: Diagnosis not present

## 2023-10-06 DIAGNOSIS — Z9181 History of falling: Secondary | ICD-10-CM | POA: Diagnosis not present

## 2023-10-06 DIAGNOSIS — M6281 Muscle weakness (generalized): Secondary | ICD-10-CM | POA: Diagnosis not present

## 2023-10-08 DIAGNOSIS — Z9181 History of falling: Secondary | ICD-10-CM | POA: Diagnosis not present

## 2023-10-08 DIAGNOSIS — M6281 Muscle weakness (generalized): Secondary | ICD-10-CM | POA: Diagnosis not present

## 2023-10-08 DIAGNOSIS — I160 Hypertensive urgency: Secondary | ICD-10-CM | POA: Diagnosis not present

## 2023-10-10 DIAGNOSIS — I160 Hypertensive urgency: Secondary | ICD-10-CM | POA: Diagnosis not present

## 2023-10-10 DIAGNOSIS — Z9181 History of falling: Secondary | ICD-10-CM | POA: Diagnosis not present

## 2023-10-10 DIAGNOSIS — M6281 Muscle weakness (generalized): Secondary | ICD-10-CM | POA: Diagnosis not present

## 2023-10-12 DIAGNOSIS — I160 Hypertensive urgency: Secondary | ICD-10-CM | POA: Diagnosis not present

## 2023-10-12 DIAGNOSIS — Z9181 History of falling: Secondary | ICD-10-CM | POA: Diagnosis not present

## 2023-10-12 DIAGNOSIS — M6281 Muscle weakness (generalized): Secondary | ICD-10-CM | POA: Diagnosis not present

## 2023-10-13 DIAGNOSIS — Z9181 History of falling: Secondary | ICD-10-CM | POA: Diagnosis not present

## 2023-10-13 DIAGNOSIS — M6281 Muscle weakness (generalized): Secondary | ICD-10-CM | POA: Diagnosis not present

## 2023-10-13 DIAGNOSIS — I160 Hypertensive urgency: Secondary | ICD-10-CM | POA: Diagnosis not present

## 2023-10-14 DIAGNOSIS — B351 Tinea unguium: Secondary | ICD-10-CM | POA: Diagnosis not present

## 2023-10-14 DIAGNOSIS — I7091 Generalized atherosclerosis: Secondary | ICD-10-CM | POA: Diagnosis not present

## 2023-10-14 DIAGNOSIS — I160 Hypertensive urgency: Secondary | ICD-10-CM | POA: Diagnosis not present

## 2023-10-14 DIAGNOSIS — Z9181 History of falling: Secondary | ICD-10-CM | POA: Diagnosis not present

## 2023-10-14 DIAGNOSIS — M6281 Muscle weakness (generalized): Secondary | ICD-10-CM | POA: Diagnosis not present

## 2023-10-22 DIAGNOSIS — Z9181 History of falling: Secondary | ICD-10-CM | POA: Diagnosis not present

## 2023-10-22 DIAGNOSIS — I160 Hypertensive urgency: Secondary | ICD-10-CM | POA: Diagnosis not present

## 2023-10-22 DIAGNOSIS — M6281 Muscle weakness (generalized): Secondary | ICD-10-CM | POA: Diagnosis not present

## 2023-10-27 DIAGNOSIS — Z9181 History of falling: Secondary | ICD-10-CM | POA: Diagnosis not present

## 2023-10-27 DIAGNOSIS — M6281 Muscle weakness (generalized): Secondary | ICD-10-CM | POA: Diagnosis not present

## 2023-10-27 DIAGNOSIS — I160 Hypertensive urgency: Secondary | ICD-10-CM | POA: Diagnosis not present

## 2023-10-28 DIAGNOSIS — I160 Hypertensive urgency: Secondary | ICD-10-CM | POA: Diagnosis not present

## 2023-10-28 DIAGNOSIS — M6281 Muscle weakness (generalized): Secondary | ICD-10-CM | POA: Diagnosis not present

## 2023-10-28 DIAGNOSIS — Z9181 History of falling: Secondary | ICD-10-CM | POA: Diagnosis not present

## 2023-11-02 DIAGNOSIS — Z9181 History of falling: Secondary | ICD-10-CM | POA: Diagnosis not present

## 2023-11-02 DIAGNOSIS — M6281 Muscle weakness (generalized): Secondary | ICD-10-CM | POA: Diagnosis not present

## 2023-11-02 DIAGNOSIS — I160 Hypertensive urgency: Secondary | ICD-10-CM | POA: Diagnosis not present

## 2023-11-03 ENCOUNTER — Encounter: Payer: Self-pay | Admitting: Nurse Practitioner

## 2023-11-03 ENCOUNTER — Non-Acute Institutional Stay (SKILLED_NURSING_FACILITY): Admitting: Nurse Practitioner

## 2023-11-03 DIAGNOSIS — Z9181 History of falling: Secondary | ICD-10-CM | POA: Diagnosis not present

## 2023-11-03 DIAGNOSIS — I482 Chronic atrial fibrillation, unspecified: Secondary | ICD-10-CM | POA: Diagnosis not present

## 2023-11-03 DIAGNOSIS — N1831 Chronic kidney disease, stage 3a: Secondary | ICD-10-CM

## 2023-11-03 DIAGNOSIS — N3281 Overactive bladder: Secondary | ICD-10-CM

## 2023-11-03 DIAGNOSIS — F03918 Unspecified dementia, unspecified severity, with other behavioral disturbance: Secondary | ICD-10-CM | POA: Diagnosis not present

## 2023-11-03 DIAGNOSIS — F3341 Major depressive disorder, recurrent, in partial remission: Secondary | ICD-10-CM

## 2023-11-03 DIAGNOSIS — M6281 Muscle weakness (generalized): Secondary | ICD-10-CM | POA: Diagnosis not present

## 2023-11-03 DIAGNOSIS — N401 Enlarged prostate with lower urinary tract symptoms: Secondary | ICD-10-CM

## 2023-11-03 DIAGNOSIS — I10 Essential (primary) hypertension: Secondary | ICD-10-CM | POA: Diagnosis not present

## 2023-11-03 DIAGNOSIS — R350 Frequency of micturition: Secondary | ICD-10-CM | POA: Diagnosis not present

## 2023-11-03 DIAGNOSIS — I509 Heart failure, unspecified: Secondary | ICD-10-CM | POA: Diagnosis not present

## 2023-11-03 DIAGNOSIS — I160 Hypertensive urgency: Secondary | ICD-10-CM | POA: Diagnosis not present

## 2023-11-03 NOTE — Progress Notes (Signed)
 Location:  Other [facility].  Nursing Home Room Number: [facility] [id number] Place of Service:  SNF [phone]) Harlene An, NP  PCP: Abdul Fine, MD  Patient Care Team: Abdul Fine, MD as PCP - General [facility] Medicine)  Extended Emergency Contact Information Primary Emergency Contact: Jim Blake Address: [address]          [place], [place] [address] Home Phone: [phone] Mobile Phone: [phone] Relation: Spouse Secondary Emergency Contact: Jim Blake Mobile Phone: [phone] Relation: Son  Goals of care: Advanced Directive information    11/03/2023    1:56 PM  Advanced Directives  Does Patient Have a Medical Advance Directive? Yes  Type of Advance Directive Out of facility DNR (pink MOST or yellow form)  Does patient want to make changes to medical advance directive? No - Patient declined     Chief Complaint  Patient presents with   Medical Management of Chronic Issues    Medical Management of Chronic Issues.     HPI:  Pt is a 88 y.o. male seen today for medical management of chronic disease.  Pt with hx of dementia with behaviors, anxiety, OAB, BPH, htn.  Pt is doing well. No changes in behavior.  Staff reports he is sleeping well. Pt does not have any complaints and staff has no concern.    Past Medical History:  Diagnosis Date   Atrial fibrillation (HCC)    persistent   on NOAC   Hypertension    Vitamin B 12 deficiency    History reviewed. No pertinent surgical history.  No Known Allergies  Outpatient Encounter Medications as of 11/03/2023  Medication Sig   acetaminophen  (TYLENOL ) 500 MG tablet Take 1,000 mg by mouth every 8 (eight) hours as needed.   apixaban  (ELIQUIS ) 5 MG TABS tablet Take 5 mg by mouth 2 (two) times daily.   ARIPiprazole (ABILIFY) 10 MG tablet Take 10 mg by mouth daily.   busPIRone (BUSPAR) 5 MG tablet Take 5 mg by mouth 3 (three) times daily.   furosemide  (LASIX ) 40 MG tablet Take 40 mg by mouth daily.    ketoconazole (NIZORAL) 2 % shampoo Apply 1 Application topically daily. Wed and Sun   losartan (COZAAR) 25 MG tablet Take 25 mg by mouth daily.   melatonin 3 MG TABS tablet Take 3 mg by mouth at bedtime.   memantine (NAMENDA) 10 MG tablet Take 10 mg by mouth 2 (two) times daily.   Multiple Vitamins-Minerals (MULTIVITAMIN WITH MINERALS) tablet Take 1 tablet by mouth daily.   potassium chloride  (KLOR-CON ) 20 MEQ packet Take 40 mEq by mouth daily.   sertraline  (ZOLOFT ) 100 MG tablet Take 100 mg by mouth daily. (Patient taking differently: Take 150 mg by mouth at bedtime.)   tamsulosin (FLOMAX) 0.4 MG CAPS capsule Take 0.4 mg by mouth.   Vibegron (GEMTESA) 75 MG TABS Take 1 tablet by mouth daily.   Zinc Oxide (TRIPLE PASTE) 12.8 % ointment Apply 1 Application topically daily. Every shift.   tranexamic acid (LYSTEDA) 650 MG TABS tablet Take 650 mg by mouth daily as needed. (Patient not taking: Reported on 11/03/2023)   No facility-administered encounter medications on file as of 11/03/2023.    Review of Systems  Unable to perform ROS: Dementia     Immunization History  Administered Date(s) Administered   Influenza Inj Mdck Quad Pf 01/14/2019   Influenza,inj,Quad PF,6+ Mos 03/21/2020   Influenza-Unspecified 01/18/2016, 01/16/2017, 01/29/2018, 02/18/2023   Moderna Covid-19 Fall Seasonal Vaccine 20yrs & older 08/07/2023  Moderna Sars-Covid-2 Vaccination 12/01/2019, 12/29/2019   Td 10/26/2017   Td (Adult),5 Lf Tetanus Toxid, Preservative Free 10/26/2017   Td (Adult),unspecified 05/01/2008   Tdap 05/01/2008   Zoster, Live 04/28/2009   Pertinent  Health Maintenance Due  Topic Date Due   INFLUENZA VACCINE  11/27/2023      05/03/2022    8:10 AM 05/03/2022    8:00 PM 05/04/2022    8:35 AM 05/04/2022    8:00 PM 05/05/2022   11:00 AM  Fall Risk  (RETIRED) Patient Fall Risk Level High fall risk  High fall risk  High fall risk  High fall risk  High fall risk      Data saved with a previous flowsheet  row definition   Functional Status Survey:    Vitals:   11/03/23 1349 11/03/23 1357  BP: (!) 160/65 121/75  Pulse: (!) 54   Resp: 20   Temp: (!) 97.2 F (36.2 C)   SpO2: 96%   Weight: 214 lb 12.8 oz (97.4 kg)   Height: 6' (1.829 m)    Body mass index is 29.13 kg/m. Physical Exam Constitutional:      General: He is not in acute distress.    Appearance: He is well-developed. He is not diaphoretic.  HENT:     Head: Normocephalic and atraumatic.     Right Ear: External ear normal.     Left Ear: External ear normal.     Mouth/Throat:     Pharynx: No oropharyngeal exudate.  Eyes:     Conjunctiva/sclera: Conjunctivae normal.     Pupils: Pupils are equal, round, and reactive to light.  Cardiovascular:     Rate and Rhythm: Normal rate and regular rhythm.     Heart sounds: Normal heart sounds.  Pulmonary:     Effort: Pulmonary effort is normal.     Breath sounds: Normal breath sounds.  Abdominal:     General: Bowel sounds are normal.     Palpations: Abdomen is soft.  Musculoskeletal:        General: No tenderness.     Cervical back: Normal range of motion and neck supple.     Right lower leg: No edema.     Left lower leg: No edema.  Skin:    General: Skin is warm and dry.  Neurological:     Mental Status: He is alert. He is disoriented.  Psychiatric:        Mood and Affect: Mood normal.     Labs reviewed: Recent Labs    03/30/23 0000 05/21/23 0000 07/16/23 0000  NA 138 137 138  K 3.7 3.4* 3.7  CL 97* 97* 101  CO2 32* 33* 29*  BUN 18 20 18   CREATININE 1.5* 1.6* 1.4*  CALCIUM 9.1 9.2 8.8   Recent Labs    02/16/23 0000  AST 18  ALT 13  ALKPHOS 100  ALBUMIN 3.9   Recent Labs    02/16/23 0000 08/06/23 0000  WBC 6.4 6.8  NEUTROABS  --  4,202.00  HGB 12.9* 10.5*  HCT 42 33*  PLT 233 209   Lab Results  Component Value Date   TSH 1.511 08/04/2022   No results found for: HGBA1C No results found for: CHOL, HDL, LDLCALC, LDLDIRECT,  TRIG, CHOLHDL  Significant Diagnostic Results in last 30 days:  No results found.  Assessment/Plan Atrial fibrillation, chronic (HCC) Rate controlled, continues on eliquis    Benign prostatic hyperplasia with urinary frequency Stable on flomax, no increase in urinary symptoms.  Congestive heart failure, unspecified HF chronicity, unspecified heart failure type (HCC) Euvolemic, continues on lasix   Dementia with behavioral disturbance (HCC) Stable, no acute changes in cognitive or functional status, continue supportive care. Behaviors are stable on current regimen, continue a this time.   Depression Stable on zoloft  and buspar. Will continue.   Essential hypertension Bp stable at this time.  Will continue current regimen Monitor bmp  Overactive bladder Has improved on gemtesa. Will continue current regimen.   Stage 3a chronic kidney disease (HCC) Chronic and stable Encourage proper hydration Follow metabolic panel Avoid nephrotoxic meds (NSAIDS)     Seon Gaertner K. Caro BODILY [facility] [phone]

## 2023-11-03 NOTE — Assessment & Plan Note (Signed)
?   Rate controlled, continues on eliquis

## 2023-11-03 NOTE — Assessment & Plan Note (Signed)
 Chronic and stable Encourage proper hydration Follow metabolic panel Avoid nephrotoxic meds (NSAIDS)

## 2023-11-03 NOTE — Assessment & Plan Note (Addendum)
 Bp stable at this time.  Will continue current regimen Monitor bmp

## 2023-11-03 NOTE — Assessment & Plan Note (Signed)
 Has improved on gemtesa. Will continue current regimen.

## 2023-11-03 NOTE — Assessment & Plan Note (Signed)
 Stable on zoloft  and buspar. Will continue.

## 2023-11-03 NOTE — Assessment & Plan Note (Signed)
 Euvolemic, continues on lasix

## 2023-11-03 NOTE — Assessment & Plan Note (Signed)
 Stable, no acute changes in cognitive or functional status, continue supportive care. Behaviors are stable on current regimen, continue a this time.

## 2023-11-03 NOTE — Assessment & Plan Note (Signed)
 Stable on flomax, no increase in urinary symptoms.

## 2023-11-05 DIAGNOSIS — Z9181 History of falling: Secondary | ICD-10-CM | POA: Diagnosis not present

## 2023-11-05 DIAGNOSIS — M6281 Muscle weakness (generalized): Secondary | ICD-10-CM | POA: Diagnosis not present

## 2023-11-05 DIAGNOSIS — I160 Hypertensive urgency: Secondary | ICD-10-CM | POA: Diagnosis not present

## 2023-11-17 DIAGNOSIS — M6281 Muscle weakness (generalized): Secondary | ICD-10-CM | POA: Diagnosis not present

## 2023-11-17 DIAGNOSIS — I160 Hypertensive urgency: Secondary | ICD-10-CM | POA: Diagnosis not present

## 2023-11-17 DIAGNOSIS — Z9181 History of falling: Secondary | ICD-10-CM | POA: Diagnosis not present

## 2023-11-19 DIAGNOSIS — Z9181 History of falling: Secondary | ICD-10-CM | POA: Diagnosis not present

## 2023-11-19 DIAGNOSIS — M6281 Muscle weakness (generalized): Secondary | ICD-10-CM | POA: Diagnosis not present

## 2023-11-19 DIAGNOSIS — I160 Hypertensive urgency: Secondary | ICD-10-CM | POA: Diagnosis not present

## 2023-11-21 DIAGNOSIS — Z9181 History of falling: Secondary | ICD-10-CM | POA: Diagnosis not present

## 2023-11-21 DIAGNOSIS — M6281 Muscle weakness (generalized): Secondary | ICD-10-CM | POA: Diagnosis not present

## 2023-11-21 DIAGNOSIS — I160 Hypertensive urgency: Secondary | ICD-10-CM | POA: Diagnosis not present

## 2023-11-25 DIAGNOSIS — M6281 Muscle weakness (generalized): Secondary | ICD-10-CM | POA: Diagnosis not present

## 2023-11-25 DIAGNOSIS — Z9181 History of falling: Secondary | ICD-10-CM | POA: Diagnosis not present

## 2023-11-25 DIAGNOSIS — I160 Hypertensive urgency: Secondary | ICD-10-CM | POA: Diagnosis not present

## 2023-11-28 DIAGNOSIS — Z9181 History of falling: Secondary | ICD-10-CM | POA: Diagnosis not present

## 2023-11-28 DIAGNOSIS — I160 Hypertensive urgency: Secondary | ICD-10-CM | POA: Diagnosis not present

## 2023-11-28 DIAGNOSIS — M6281 Muscle weakness (generalized): Secondary | ICD-10-CM | POA: Diagnosis not present

## 2023-12-01 DIAGNOSIS — I160 Hypertensive urgency: Secondary | ICD-10-CM | POA: Diagnosis not present

## 2023-12-01 DIAGNOSIS — Z9181 History of falling: Secondary | ICD-10-CM | POA: Diagnosis not present

## 2023-12-01 DIAGNOSIS — M6281 Muscle weakness (generalized): Secondary | ICD-10-CM | POA: Diagnosis not present

## 2023-12-04 DIAGNOSIS — Z9181 History of falling: Secondary | ICD-10-CM | POA: Diagnosis not present

## 2023-12-04 DIAGNOSIS — M6281 Muscle weakness (generalized): Secondary | ICD-10-CM | POA: Diagnosis not present

## 2023-12-05 DIAGNOSIS — Z9181 History of falling: Secondary | ICD-10-CM | POA: Diagnosis not present

## 2023-12-05 DIAGNOSIS — M6281 Muscle weakness (generalized): Secondary | ICD-10-CM | POA: Diagnosis not present

## 2023-12-10 DIAGNOSIS — M6281 Muscle weakness (generalized): Secondary | ICD-10-CM | POA: Diagnosis not present

## 2023-12-10 DIAGNOSIS — Z9181 History of falling: Secondary | ICD-10-CM | POA: Diagnosis not present

## 2023-12-10 DIAGNOSIS — I160 Hypertensive urgency: Secondary | ICD-10-CM | POA: Diagnosis not present

## 2023-12-15 DIAGNOSIS — I160 Hypertensive urgency: Secondary | ICD-10-CM | POA: Diagnosis not present

## 2023-12-15 DIAGNOSIS — Z9181 History of falling: Secondary | ICD-10-CM | POA: Diagnosis not present

## 2023-12-15 DIAGNOSIS — M6281 Muscle weakness (generalized): Secondary | ICD-10-CM | POA: Diagnosis not present

## 2023-12-18 DIAGNOSIS — B351 Tinea unguium: Secondary | ICD-10-CM | POA: Diagnosis not present

## 2023-12-18 DIAGNOSIS — I7091 Generalized atherosclerosis: Secondary | ICD-10-CM | POA: Diagnosis not present

## 2023-12-23 DIAGNOSIS — M6281 Muscle weakness (generalized): Secondary | ICD-10-CM | POA: Diagnosis not present

## 2023-12-23 DIAGNOSIS — Z9181 History of falling: Secondary | ICD-10-CM | POA: Diagnosis not present

## 2023-12-24 DIAGNOSIS — Z9181 History of falling: Secondary | ICD-10-CM | POA: Diagnosis not present

## 2023-12-24 DIAGNOSIS — M6281 Muscle weakness (generalized): Secondary | ICD-10-CM | POA: Diagnosis not present

## 2024-01-11 ENCOUNTER — Non-Acute Institutional Stay (SKILLED_NURSING_FACILITY): Payer: Self-pay | Admitting: Adult Health

## 2024-01-11 ENCOUNTER — Encounter: Payer: Self-pay | Admitting: Adult Health

## 2024-01-11 DIAGNOSIS — F03918 Unspecified dementia, unspecified severity, with other behavioral disturbance: Secondary | ICD-10-CM

## 2024-01-11 DIAGNOSIS — I10 Essential (primary) hypertension: Secondary | ICD-10-CM | POA: Diagnosis not present

## 2024-01-11 DIAGNOSIS — G4710 Hypersomnia, unspecified: Secondary | ICD-10-CM | POA: Diagnosis not present

## 2024-01-11 NOTE — Progress Notes (Signed)
 Location:  Other [facility] Nursing Home Room Number: [place] [address]-A Place of Service:  SNF ([phone]) Provider:  Medina-Vargas, Benedetta Sundstrom, DNP, FNP-BC  Patient Care Team: Abdul Fine, MD as PCP - General (Family Medicine)  Extended Emergency Contact Information Primary Emergency Contact: [name] Address: [address]          [place], [place] [address] Home Phone: [phone] Mobile Phone: [phone] Relation: Spouse Secondary Emergency Contact: Bradshaw,[name] Mobile Phone: [phone] Relation: Son  Code Status:  DNR  Goals of care: Advanced Directive information    01/11/2024   11:21 AM  Advanced Directives  Does Patient Have a Medical Advance Directive? No  Would patient like information on creating a medical advance directive? No - Patient declined     Chief Complaint  Patient presents with   Acute Visit    Medication Review    HPI:  Pt is a 88 y.o. male seen today for an acute visit for medication review. He is a resident of [facility]. He was reported to sleep constantly. He sleeps 30 minutes after eating dinner. He takes Buspar 5 mg TID, Sertraline  150 mg daily and Abilify 7.5 mg daily for Anxiety with depression.   He has dementia and takes Namenda 10 mg BID. He has a BIMS score o/15, ranging as severe cognitive impairment.   He takes Melatonin 3 mg at bedtime for insomnia.  BP 132/67, takes Losartan 25 mg daily for hypertension.  He was seen in the lounge room sitting the the geri-chair sleeping. He opens his eyes to verbal greetings.  Past Medical History:  Diagnosis Date   Atrial fibrillation (HCC)    persistent   on NOAC   Hypertension    Vitamin B 12 deficiency    History reviewed. No pertinent surgical history.  No Known Allergies  Outpatient Encounter Medications as of 01/11/2024  Medication Sig   acetaminophen  (TYLENOL ) 500 MG tablet Take 1,000 mg by mouth every 8 (eight) hours as needed.   apixaban  (ELIQUIS ) 5 MG  TABS tablet Take 5 mg by mouth 2 (two) times daily.   ARIPiprazole (ABILIFY) 10 MG tablet Take 10 mg by mouth daily.   busPIRone (BUSPAR) 5 MG tablet Take 5 mg by mouth 3 (three) times daily.   furosemide  (LASIX ) 40 MG tablet Take 40 mg by mouth daily.   ketoconazole (NIZORAL) 2 % shampoo Apply 1 Application topically daily. Wed and Sun   losartan (COZAAR) 25 MG tablet Take 25 mg by mouth daily.   melatonin 3 MG TABS tablet Take 3 mg by mouth at bedtime.   memantine (NAMENDA) 10 MG tablet Take 10 mg by mouth 2 (two) times daily.   Multiple Vitamins-Minerals (MULTIVITAMIN WITH MINERALS) tablet Take 1 tablet by mouth daily.   potassium chloride  (KLOR-CON ) 20 MEQ packet Take 40 mEq by mouth daily.   sertraline  (ZOLOFT ) 100 MG tablet Take 100 mg by mouth daily. (Patient taking differently: Take 150 mg by mouth at bedtime.)   tamsulosin (FLOMAX) 0.4 MG CAPS capsule Take 0.4 mg by mouth.   Vibegron (GEMTESA) 75 MG TABS Take 1 tablet by mouth daily.   Zinc Oxide (TRIPLE PASTE) 12.8 % ointment Apply 1 Application topically daily. Every shift.   tranexamic acid (LYSTEDA) 650 MG TABS tablet Take 650 mg by mouth daily as needed. (Patient not taking: Reported on 01/11/2024)   No facility-administered encounter medications on file as of 01/11/2024.    Review of Systems  Unable to obtain due to  dementia.    Immunization History  Administered Date(s) Administered   Influenza Inj Mdck Quad Pf 01/14/2019   Influenza,inj,Quad PF,6+ Mos 03/21/2020   Influenza-Unspecified 01/18/2016, 01/16/2017, 01/29/2018, 02/18/2023   Moderna Covid-19 Fall Seasonal Vaccine 43yrs & older 08/07/2023   Moderna Sars-Covid-2 Vaccination 12/01/2019, 12/29/2019   Td 10/26/2017   Td (Adult),5 Lf Tetanus Toxid, Preservative Free 10/26/2017   Td (Adult),unspecified 05/01/2008   Tdap 05/01/2008   Zoster, Live 04/28/2009   Pertinent  Health Maintenance Due  Topic Date Due   Influenza Vaccine  11/27/2023      05/03/2022     8:10 AM 05/03/2022    8:00 PM 05/04/2022    8:35 AM 05/04/2022    8:00 PM 05/05/2022   11:00 AM  Fall Risk  (RETIRED) Patient Fall Risk Level High fall risk  High fall risk  High fall risk  High fall risk  High fall risk      Data saved with a previous flowsheet row definition     Vitals:   01/11/24 1126  BP: 132/87  Pulse: 61  Resp: 17  Temp: 98.1 F (36.7 C)  SpO2: 97%  Weight: 213 lb 12.8 oz (97 kg)  Height: 6' (1.829 m)   Body mass index is 29 kg/m.  Physical Exam Constitutional:      General: He is not in acute distress. HENT:     Head: Normocephalic and atraumatic.     Mouth/Throat:     Mouth: Mucous membranes are moist.  Eyes:     Conjunctiva/sclera: Conjunctivae normal.  Cardiovascular:     Rate and Rhythm: Normal rate and regular rhythm.     Pulses: Normal pulses.     Heart sounds: Normal heart sounds.  Pulmonary:     Effort: Pulmonary effort is normal.     Breath sounds: Normal breath sounds.  Abdominal:     General: Bowel sounds are normal.     Palpations: Abdomen is soft.  Musculoskeletal:        General: No swelling.     Cervical back: Normal range of motion.  Skin:    General: Skin is warm and dry.  Psychiatric:        Mood and Affect: Mood normal.        Behavior: Behavior normal.      Labs reviewed: Recent Labs    03/30/23 0000 05/21/23 0000 07/16/23 0000  NA 138 137 138  K 3.7 3.4* 3.7  CL 97* 97* 101  CO2 32* 33* 29*  BUN 18 20 18   CREATININE 1.5* 1.6* 1.4*  CALCIUM 9.1 9.2 8.8   Recent Labs    02/16/23 0000  AST 18  ALT 13  ALKPHOS 100  ALBUMIN 3.9   Recent Labs    02/16/23 0000 08/06/23 0000  WBC 6.4 6.8  NEUTROABS  --  4,202.00  HGB 12.9* 10.5*  HCT 42 33*  PLT 233 209   Lab Results  Component Value Date   TSH 1.511 08/04/2022   No results found for: HGBA1C No results found for: CHOL, HDL, LDLCALC, LDLDIRECT, TRIG, CHOLHDL  Significant Diagnostic Results in last 30 days:  No results  found.  Assessment/Plan  1. Hypersomnia (Primary) -  probably due to psychotropic medications -  will decrease Abilify to 5 mg daily -  discontinue Melatonin  2. Dementia with behavioral disturbance (HCC) -  BIMS score 0/15, ranging as severe cognitive impairment -  continue Namenda 10 mg BID -  continue supportive care  3. Essential  hypertension -  BP stable -  continue Losartan 25 mg daily    Family/ staff Communication: Discussed plan of care with charge nurse.  Labs/tests ordered: None    Claribel Sachs Medina-Vargas, DNP, MSN, FNP-BC [facility] [phone] (Monday-Friday 8:00 a.m. - 5:00 p.m.) [phone] (after hours)

## 2024-01-14 DIAGNOSIS — M6281 Muscle weakness (generalized): Secondary | ICD-10-CM | POA: Diagnosis not present

## 2024-01-14 DIAGNOSIS — N1831 Chronic kidney disease, stage 3a: Secondary | ICD-10-CM | POA: Diagnosis not present

## 2024-01-14 DIAGNOSIS — I509 Heart failure, unspecified: Secondary | ICD-10-CM | POA: Diagnosis not present

## 2024-01-14 LAB — COMPREHENSIVE METABOLIC PANEL WITH GFR
Calcium: 8.6 — AB (ref 8.7–10.7)
eGFR: 40

## 2024-01-14 LAB — CBC AND DIFFERENTIAL
HCT: 37 — AB (ref 41–53)
Hemoglobin: 12.2 — AB (ref 13.5–17.5)
Platelets: 189 K/uL (ref 150–400)
WBC: 7.8

## 2024-01-14 LAB — BASIC METABOLIC PANEL WITH GFR
BUN: 23 — AB (ref 4–21)
CO2: 29 — AB (ref 13–22)
Chloride: 103 (ref 99–108)
Creatinine: 1.7 — AB (ref 0.6–1.3)
Glucose: 92
Potassium: 3.7 meq/L (ref 3.5–5.1)
Sodium: 137 (ref 137–147)

## 2024-01-14 LAB — CBC: RBC: 4.55 (ref 3.87–5.11)

## 2024-01-19 ENCOUNTER — Encounter: Payer: Self-pay | Admitting: Nurse Practitioner

## 2024-01-19 ENCOUNTER — Non-Acute Institutional Stay (SKILLED_NURSING_FACILITY): Admitting: Nurse Practitioner

## 2024-01-19 DIAGNOSIS — N1831 Chronic kidney disease, stage 3a: Secondary | ICD-10-CM | POA: Diagnosis not present

## 2024-01-19 DIAGNOSIS — R350 Frequency of micturition: Secondary | ICD-10-CM | POA: Diagnosis not present

## 2024-01-19 DIAGNOSIS — K089 Disorder of teeth and supporting structures, unspecified: Secondary | ICD-10-CM | POA: Insufficient documentation

## 2024-01-19 DIAGNOSIS — I10 Essential (primary) hypertension: Secondary | ICD-10-CM

## 2024-01-19 DIAGNOSIS — F03918 Unspecified dementia, unspecified severity, with other behavioral disturbance: Secondary | ICD-10-CM

## 2024-01-19 DIAGNOSIS — I482 Chronic atrial fibrillation, unspecified: Secondary | ICD-10-CM

## 2024-01-19 DIAGNOSIS — I509 Heart failure, unspecified: Secondary | ICD-10-CM

## 2024-01-19 DIAGNOSIS — N401 Enlarged prostate with lower urinary tract symptoms: Secondary | ICD-10-CM

## 2024-01-19 DIAGNOSIS — N3281 Overactive bladder: Secondary | ICD-10-CM

## 2024-01-19 DIAGNOSIS — F3341 Major depressive disorder, recurrent, in partial remission: Secondary | ICD-10-CM

## 2024-01-19 NOTE — Assessment & Plan Note (Signed)
 Controlled on gemtesa. Will continue current regimen.

## 2024-01-19 NOTE — Assessment & Plan Note (Signed)
 Bp stable at this time.  Will continue current regimen Monitor bmp

## 2024-01-19 NOTE — Assessment & Plan Note (Signed)
 Euvolemic, continues on lasix

## 2024-01-19 NOTE — Assessment & Plan Note (Addendum)
 Rate controlled, continues on eliquis for anticoagulation

## 2024-01-19 NOTE — Assessment & Plan Note (Signed)
 Chronic and stable Encourage proper hydration Follow metabolic panel Avoid nephrotoxic meds (NSAIDS)

## 2024-01-19 NOTE — Assessment & Plan Note (Signed)
 Stable, no acute changes in cognitive or functional status, continue supportive care. Behaviors are stable on current regimen, somnolence improved with dose reduction of Abilify. Continue current regimen.

## 2024-01-19 NOTE — Progress Notes (Signed)
 Location:  Other [facility].  Nursing Home Room Number: Learta Beagle [id number] Place of Service:  SNF [id number]) Harlene An, NP  PCP: Abdul Fine, MD  Patient Care Team: Abdul Fine, MD as PCP - General [facility] Medicine)  Extended Emergency Contact Information Primary Emergency Contact: [name] Address: [address]          [place], [place] [address] Home Phone: [phone] Mobile Phone: [phone] Relation: Spouse Secondary Emergency Contact: Swier,[name] Mobile Phone: [phone] Relation: Son  Goals of care: Advanced Directive information    01/11/2024   11:21 AM  Advanced Directives  Does Patient Have a Medical Advance Directive? No  Would patient like information on creating a medical advance directive? No - Patient declined     Chief Complaint  Patient presents with   Medical Management of Chronic Issues    Medical Management of Chronic Issues.     HPI:  Pt is a 88 y.o. male seen today for medical management of chronic disease.  Pt with dementia with behaviors, bph, htn, chf, a fib.  He was recently seen recently for hypersomnolence and abilify was reduced. He is now more alert and without behaviors at this time.  Anxiety and depression controlled. Nursing noted he spit out some blood this morning at breakfast- has several teeth that are broken. No signs of trama or wound noted.  He denies pain. No active bleeding noted.  Nursing helping with ADLs.   Past Medical History:  Diagnosis Date   Atrial fibrillation (HCC)    persistent   on NOAC   Hypertension    Vitamin B 12 deficiency    History reviewed. No pertinent surgical history.  No Known Allergies  Outpatient Encounter Medications as of 01/19/2024  Medication Sig   acetaminophen  (TYLENOL ) 500 MG tablet Take 1,000 mg by mouth every 8 (eight) hours as needed.   apixaban  (ELIQUIS ) 5 MG TABS tablet Take 5 mg by mouth 2 (two) times daily.   ARIPiprazole (ABILIFY) 5 MG tablet Take 5  mg by mouth daily.   busPIRone (BUSPAR) 5 MG tablet Take 5 mg by mouth 3 (three) times daily.   furosemide  (LASIX ) 40 MG tablet Take 40 mg by mouth daily.   ketoconazole (NIZORAL) 2 % shampoo Apply 1 Application topically daily. Wed and Sun   losartan (COZAAR) 25 MG tablet Take 25 mg by mouth daily.   memantine (NAMENDA) 10 MG tablet Take 10 mg by mouth 2 (two) times daily.   Multiple Vitamins-Minerals (MULTIVITAMIN WITH MINERALS) tablet Take 1 tablet by mouth daily.   potassium chloride  (KLOR-CON ) 20 MEQ packet Take 40 mEq by mouth daily.   sertraline  (ZOLOFT ) 100 MG tablet Take 100 mg by mouth daily. (Patient taking differently: Take 150 mg by mouth at bedtime.)   tamsulosin (FLOMAX) 0.4 MG CAPS capsule Take 0.4 mg by mouth.   Vibegron (GEMTESA) 75 MG TABS Take 1 tablet by mouth daily.   Zinc Oxide (TRIPLE PASTE) 12.8 % ointment Apply 1 Application topically daily. Every shift.   ARIPiprazole (ABILIFY) 10 MG tablet Take 10 mg by mouth daily. (Patient not taking: Reported on 01/19/2024)   melatonin 3 MG TABS tablet Take 3 mg by mouth at bedtime. (Patient not taking: Reported on 01/19/2024)   tranexamic acid (LYSTEDA) 650 MG TABS tablet Take 650 mg by mouth daily as needed. (Patient not taking: Reported on 01/19/2024)   No facility-administered encounter medications on file as of 01/19/2024.    Review of Systems  Unable to perform  ROS: Dementia     Immunization History  Administered Date(s) Administered   Influenza Inj Mdck Quad Pf 01/14/2019   Influenza,inj,Quad PF,6+ Mos 03/21/2020   Influenza-Unspecified 01/18/2016, 01/16/2017, 01/29/2018, 02/18/2023   Moderna Covid-19 Fall Seasonal Vaccine 87yrs & older 08/07/2023   Moderna Sars-Covid-2 Vaccination 12/01/2019, 12/29/2019   Td 10/26/2017   Td (Adult),5 Lf Tetanus Toxid, Preservative Free 10/26/2017   Td (Adult),unspecified 05/01/2008   Tdap 05/01/2008   Zoster, Live 04/28/2009   Pertinent  Health Maintenance Due  Topic Date Due    Influenza Vaccine  11/27/2023      05/03/2022    8:10 AM 05/03/2022    8:00 PM 05/04/2022    8:35 AM 05/04/2022    8:00 PM 05/05/2022   11:00 AM  Fall Risk  (RETIRED) Patient Fall Risk Level High fall risk  High fall risk  High fall risk  High fall risk  High fall risk      Data saved with a previous flowsheet row definition   Functional Status Survey:    Vitals:   01/19/24 0919 01/19/24 0949  BP: (!) 164/91 139/71  Pulse: (!) 56   Resp: 17   Temp: 98.5 F (36.9 C)   SpO2: 96%   Weight: 213 lb 6.4 oz (96.8 kg)   Height: 6' (1.829 m)    Body mass index is 28.94 kg/m. Physical Exam Constitutional:      General: He is not in acute distress.    Appearance: He is well-developed. He is not diaphoretic.  HENT:     Head: Normocephalic and atraumatic.     Mouth/Throat:     Pharynx: No oropharyngeal exudate.  Eyes:     Conjunctiva/sclera: Conjunctivae normal.     Pupils: Pupils are equal, round, and reactive to light.  Cardiovascular:     Rate and Rhythm: Normal rate and regular rhythm.     Heart sounds: Normal heart sounds.  Pulmonary:     Effort: Pulmonary effort is normal.     Breath sounds: Normal breath sounds.  Abdominal:     General: Bowel sounds are normal.     Palpations: Abdomen is soft.  Musculoskeletal:        General: No tenderness.     Cervical back: Normal range of motion and neck supple.  Skin:    General: Skin is warm and dry.  Neurological:     Mental Status: He is alert. Mental status is at baseline. He is disoriented.     Motor: Weakness present.     Gait: Gait abnormal.  Psychiatric:        Mood and Affect: Mood normal.    Labs reviewed: Recent Labs    05/21/23 0000 07/16/23 0000 01/14/24 0000  NA 137 138 137  K 3.4* 3.7 3.7  CL 97* 101 103  CO2 33* 29* 29*  BUN 20 18 23*  CREATININE 1.6* 1.4* 1.7*  CALCIUM 9.2 8.8 8.6*   Recent Labs    02/16/23 0000  AST 18  ALT 13  ALKPHOS 100  ALBUMIN 3.9   Recent Labs    02/16/23 0000  08/06/23 0000 01/14/24 0000  WBC 6.4 6.8 7.8  NEUTROABS  --  4,202.00  --   HGB 12.9* 10.5* 12.2*  HCT 42 33* 37*  PLT 233 209 189   Lab Results  Component Value Date   TSH 1.511 08/04/2022   No results found for: HGBA1C No results found for: CHOL, HDL, LDLCALC, LDLDIRECT, TRIG, CHOLHDL  Significant Diagnostic Results  in last 30 days:  No results found.  Assessment/Plan Atrial fibrillation, chronic (HCC) Rate controlled, continues on eliquis  for anticoagulation   Benign prostatic hyperplasia with urinary frequency Stable on flomax, no increase in urinary symptoms. Stable on flomax, no increase in urinary symptoms.   Congestive heart failure, unspecified HF chronicity, unspecified heart failure type (HCC) Euvolemic, continues on lasix .   Depression Stable on zoloft  and buspar. Will continue.   Essential hypertension Bp stable at this time.  Will continue current regimen Monitor bmp  Overactive bladder Controlled on gemtesa. Will continue current regimen.   Poor dentition Cracked teeth and decay noted.  To follow up with dentistry   Stage 3a chronic kidney disease (HCC) Chronic and stable Encourage proper hydration Follow metabolic panel Avoid nephrotoxic meds (NSAIDS)  Dementia with behavioral disturbance (HCC) Stable, no acute changes in cognitive or functional status, continue supportive care. Behaviors are stable on current regimen, somnolence improved with dose reduction of Abilify. Continue current regimen.     Demeshia Sherburne K. Caro BODILY [facility] [phone]

## 2024-01-19 NOTE — Assessment & Plan Note (Signed)
 Stable on flomax, no increase in urinary symptoms. Stable on flomax, no increase in urinary symptoms.

## 2024-01-19 NOTE — Assessment & Plan Note (Signed)
 Cracked teeth and decay noted.  To follow up with dentistry

## 2024-01-19 NOTE — Assessment & Plan Note (Signed)
 Stable on zoloft  and buspar. Will continue.

## 2024-02-08 ENCOUNTER — Emergency Department
Admission: EM | Admit: 2024-02-08 | Discharge: 2024-02-08 | Disposition: A | Discharge status: Skilled Nursing Facility | Attending: Emergency Medicine | Admitting: Emergency Medicine

## 2024-02-08 ENCOUNTER — Emergency Department

## 2024-02-08 ENCOUNTER — Other Ambulatory Visit: Payer: Self-pay

## 2024-02-08 DIAGNOSIS — R8281 Pyuria: Secondary | ICD-10-CM | POA: Insufficient documentation

## 2024-02-08 DIAGNOSIS — W19XXXA Unspecified fall, initial encounter: Secondary | ICD-10-CM | POA: Diagnosis not present

## 2024-02-08 DIAGNOSIS — I509 Heart failure, unspecified: Secondary | ICD-10-CM | POA: Insufficient documentation

## 2024-02-08 DIAGNOSIS — S199XXA Unspecified injury of neck, initial encounter: Secondary | ICD-10-CM | POA: Diagnosis not present

## 2024-02-08 DIAGNOSIS — Z7901 Long term (current) use of anticoagulants: Secondary | ICD-10-CM | POA: Insufficient documentation

## 2024-02-08 DIAGNOSIS — S0011XA Contusion of right eyelid and periocular area, initial encounter: Secondary | ICD-10-CM | POA: Insufficient documentation

## 2024-02-08 DIAGNOSIS — Y92129 Unspecified place in nursing home as the place of occurrence of the external cause: Secondary | ICD-10-CM | POA: Insufficient documentation

## 2024-02-08 DIAGNOSIS — I130 Hypertensive heart and chronic kidney disease with heart failure and stage 1 through stage 4 chronic kidney disease, or unspecified chronic kidney disease: Secondary | ICD-10-CM | POA: Insufficient documentation

## 2024-02-08 DIAGNOSIS — S0990XA Unspecified injury of head, initial encounter: Secondary | ICD-10-CM | POA: Diagnosis not present

## 2024-02-08 DIAGNOSIS — F0390 Unspecified dementia without behavioral disturbance: Secondary | ICD-10-CM | POA: Diagnosis not present

## 2024-02-08 DIAGNOSIS — N189 Chronic kidney disease, unspecified: Secondary | ICD-10-CM | POA: Insufficient documentation

## 2024-02-08 DIAGNOSIS — I4891 Unspecified atrial fibrillation: Secondary | ICD-10-CM | POA: Diagnosis not present

## 2024-02-08 DIAGNOSIS — Z79899 Other long term (current) drug therapy: Secondary | ICD-10-CM | POA: Diagnosis not present

## 2024-02-08 DIAGNOSIS — R220 Localized swelling, mass and lump, head: Secondary | ICD-10-CM | POA: Diagnosis not present

## 2024-02-08 DIAGNOSIS — Z043 Encounter for examination and observation following other accident: Secondary | ICD-10-CM | POA: Diagnosis not present

## 2024-02-08 DIAGNOSIS — R296 Repeated falls: Secondary | ICD-10-CM

## 2024-02-08 DIAGNOSIS — S0083XA Contusion of other part of head, initial encounter: Secondary | ICD-10-CM | POA: Diagnosis not present

## 2024-02-08 DIAGNOSIS — Z743 Need for continuous supervision: Secondary | ICD-10-CM | POA: Diagnosis not present

## 2024-02-08 DIAGNOSIS — S0591XA Unspecified injury of right eye and orbit, initial encounter: Secondary | ICD-10-CM | POA: Diagnosis present

## 2024-02-08 LAB — RESP PANEL BY RT-PCR (RSV, FLU A&B, COVID)  RVPGX2
Influenza A by PCR: NEGATIVE
Influenza B by PCR: NEGATIVE
Resp Syncytial Virus by PCR: NEGATIVE
SARS Coronavirus 2 by RT PCR: NEGATIVE

## 2024-02-08 LAB — COMPREHENSIVE METABOLIC PANEL WITH GFR
ALT: 13 U/L (ref 0–44)
AST: 21 U/L (ref 15–41)
Albumin: 4.1 g/dL (ref 3.5–5.0)
Alkaline Phosphatase: 120 U/L (ref 38–126)
Anion gap: 10 (ref 5–15)
BUN: 18 mg/dL (ref 8–23)
CO2: 28 mmol/L (ref 22–32)
Calcium: 9.2 mg/dL (ref 8.9–10.3)
Chloride: 101 mmol/L (ref 98–111)
Creatinine, Ser: 1.31 mg/dL — ABNORMAL HIGH (ref 0.61–1.24)
GFR, Estimated: 53 mL/min — ABNORMAL LOW (ref >60)
Glucose, Bld: 88 mg/dL (ref 70–99)
Potassium: 3.8 mmol/L (ref 3.5–5.1)
Sodium: 139 mmol/L (ref 135–145)
Total Bilirubin: 0.8 mg/dL (ref 0.0–1.2)
Total Protein: 7.3 g/dL (ref 6.5–8.1)

## 2024-02-08 LAB — URINALYSIS, W/ REFLEX TO CULTURE (INFECTION SUSPECTED)
Bacteria, UA: NONE SEEN
Bilirubin Urine: NEGATIVE
Glucose, UA: NEGATIVE mg/dL
Hgb urine dipstick: NEGATIVE
Ketones, ur: NEGATIVE mg/dL
Nitrite: NEGATIVE
Protein, ur: NEGATIVE mg/dL
Specific Gravity, Urine: 1.009 (ref 1.005–1.030)
Squamous Epithelial / HPF: 0 /HPF (ref 0–5)
pH: 6 (ref 5.0–8.0)

## 2024-02-08 LAB — CBC WITH DIFFERENTIAL/PLATELET
Abs Immature Granulocytes: 0.02 K/uL (ref 0.00–0.07)
Basophils Absolute: 0 K/uL (ref 0.0–0.1)
Basophils Relative: 1 %
Eosinophils Absolute: 0.2 K/uL (ref 0.0–0.5)
Eosinophils Relative: 3 %
HCT: 40.6 % (ref 39.0–52.0)
Hemoglobin: 13.2 g/dL (ref 13.0–17.0)
Immature Granulocytes: 0 %
Lymphocytes Relative: 26 %
Lymphs Abs: 1.8 K/uL (ref 0.7–4.0)
MCH: 26.7 pg (ref 26.0–34.0)
MCHC: 32.5 g/dL (ref 30.0–36.0)
MCV: 82.2 fL (ref 80.0–100.0)
Monocytes Absolute: 0.5 K/uL (ref 0.1–1.0)
Monocytes Relative: 8 %
Neutro Abs: 4.3 K/uL (ref 1.7–7.7)
Neutrophils Relative %: 62 %
Platelets: 199 K/uL (ref 150–400)
RBC: 4.94 MIL/uL (ref 4.22–5.81)
RDW: 15.6 % — ABNORMAL HIGH (ref 11.5–15.5)
WBC: 6.9 K/uL (ref 4.0–10.5)
nRBC: 0 % (ref 0.0–0.2)

## 2024-02-08 LAB — TROPONIN I (HIGH SENSITIVITY): Troponin I (High Sensitivity): 12 ng/L (ref <18)

## 2024-02-08 NOTE — ED Triage Notes (Signed)
 Patient arrived via EMS from [facility] with complaint of unwittnessed fall around 1900  Patient A & O x 2 at baseline  PMHX dementia  190/81 HR 61 96% RA  Patient presents with facial swelling and bruising.  Patient currently taking Eliquis  5MG  BID

## 2024-02-08 NOTE — ED Provider Notes (Signed)
 [facility] Provider Note    Event Date/Time   First MD Initiated Contact with Patient 02/08/24 0107     (approximate)   History   Fall and Head Injury   HPI  Jim Blake is a 88 y.o. male with history of dementia, hypertension, atrial fibrillation on Eliquis , CHF, chronic kidney disease who presents to the emergency department from his skilled nursing facility after he had an unwitnessed fall.  EMS reports staff told them that he fell around 7 PM.  Patient denies any complaints.  Has obvious bruising, swelling around the right eye.   History provided by EMS, level 5 caveat secondary to dementia.    Past Medical History:  Diagnosis Date   Atrial fibrillation (HCC)    persistent   on NOAC   Hypertension    Vitamin B 12 deficiency     History reviewed. No pertinent surgical history.  MEDICATIONS:  Prior to Admission medications   Medication Sig Start Date End Date Taking? Authorizing Provider  acetaminophen  (TYLENOL ) 500 MG tablet Take 1,000 mg by mouth every 8 (eight) hours as needed.    [provider]  apixaban  (ELIQUIS ) 5 MG TABS tablet Take 5 mg by mouth 2 (two) times daily.    [provider]  ARIPiprazole (ABILIFY) 5 MG tablet Take 5 mg by mouth daily.    [provider]  busPIRone (BUSPAR) 5 MG tablet Take 5 mg by mouth 3 (three) times daily.    [provider]  furosemide  (LASIX ) 40 MG tablet Take 40 mg by mouth daily.    [provider]  ketoconazole (NIZORAL) 2 % shampoo Apply 1 Application topically daily. Wed and Sun 04/27/23   [provider]  losartan (COZAAR) 25 MG tablet Take 25 mg by mouth daily.    [provider]  memantine (NAMENDA) 10 MG tablet Take 10 mg by mouth 2 (two) times daily.    [provider]  Multiple Vitamins-Minerals (MULTIVITAMIN WITH MINERALS) tablet Take 1 tablet by mouth daily.    [provider]  potassium chloride  (KLOR-CON ) 20  MEQ packet Take 40 mEq by mouth daily.    [provider]  tamsulosin (FLOMAX) 0.4 MG CAPS capsule Take 0.4 mg by mouth.    [provider]  Vibegron (GEMTESA) 75 MG TABS Take 1 tablet by mouth daily.    [provider]  Zinc Oxide (TRIPLE PASTE) 12.8 % ointment Apply 1 Application topically daily. Every shift.    [provider]    Physical Exam   Triage Vital Signs: ED Triage Vitals  Encounter Vitals Group     BP 02/08/24 0121 (!) 179/102     Girls Systolic BP Percentile --      Girls Diastolic BP Percentile --      Boys Systolic BP Percentile --      Boys Diastolic BP Percentile --      Pulse Rate 02/08/24 0121 61     Resp 02/08/24 0121 18     Temp 02/08/24 0121 98.1 F (36.7 C)     Temp Source 02/08/24 0121 Axillary     SpO2 02/08/24 0115 97 %     Weight --      Height --      Head Circumference --      Peak Flow --      Pain Score --      Pain Loc --      Pain Education --  Exclude from Growth Chart --     Most recent vital signs: Vitals:   02/08/24 0230 02/08/24 0330  BP: (!) 158/91 (!) 149/91  Pulse: 68 87  Resp: 19 15  Temp:    SpO2: 96% 100%     CONSTITUTIONAL: Alert, oriented to person only, elderly, no distress HEAD: Normocephalic; right periorbital ecchymosis and swelling EYES: Conjunctivae clear, PERRL, EOMI, no chemosis, no hyphema, no subconjunctival hemorrhage, no proptosis ENT: normal nose; no rhinorrhea; moist mucous membranes; pharynx without lesions noted; no dental injury; no septal hematoma, no epistaxis; no facial deformity or bony tenderness NECK: Supple, no midline spinal tenderness, step-off or deformity; trachea midline CARD: Irregular and rate controlled; S1 and S2 appreciated; no murmurs, no clicks, no rubs, no gallops RESP: Normal chest excursion without splinting or tachypnea; breath sounds clear and equal bilaterally; no wheezes, no rhonchi, no rales; no hypoxia or respiratory distress CHEST:   chest wall stable, no crepitus or ecchymosis or deformity, nontender to palpation; no flail chest ABD/GI: Non-distended; soft, non-tender, no rebound, no guarding; no ecchymosis or other lesions noted PELVIS:  stable, nontender to palpation BACK:  The back appears normal; no midline spinal tenderness, step-off or deformity EXT: Normal ROM in all joints; no edema; normal capillary refill; no cyanosis, no bony tenderness or bony deformity of patient's extremities, no joint effusions, compartments are soft, extremities are warm and well-perfused, no ecchymosis SKIN: Normal color for age and race; warm NEURO: No facial asymmetry, normal speech, moving all extremities equally  ED Results / Procedures / Treatments   LABS: (all labs ordered are listed, but only abnormal results are displayed) Labs Reviewed  CBC WITH DIFFERENTIAL/PLATELET - Abnormal; Notable for the following components:      Result Value   RDW 15.6 (*)    All other components within normal limits  COMPREHENSIVE METABOLIC PANEL WITH GFR - Abnormal; Notable for the following components:   Creatinine, Ser 1.31 (*)    GFR, Estimated 53 (*)    All other components within normal limits  URINALYSIS, W/ REFLEX TO CULTURE (INFECTION SUSPECTED) - Abnormal; Notable for the following components:   Color, Urine YELLOW (*)    APPearance HAZY (*)    Leukocytes,Ua MODERATE (*)    All other components within normal limits  RESP PANEL BY RT-PCR (RSV, FLU A&B, COVID)  [id number]  URINE CULTURE  TROPONIN I (HIGH SENSITIVITY)     EKG:  EKG Interpretation Date/Time:  Monday February 08 2024 01:34:14 EDT Ventricular Rate:  45 PR Interval:    QRS Duration:  121 QT Interval:  489 QTC Calculation: 423 R Axis:   -38  Text Interpretation: Atrial fibrillation Nonspecific IVCD with LAD Nonspecific T abnormalities, lateral leads Confirmed by Neomi Neptune [id number]) on 02/08/2024 1:49:09 AM          RADIOLOGY: My personal review and  interpretation of imaging: CT scan show no traumatic injury other than soft tissue contusion.  I have personally reviewed all radiology reports. CT HEAD WO CONTRAST ( ) Result Date: 02/08/2024 EXAM: CT HEAD, FACIAL BONES AND CERVICAL SPINE WITHOUT CONTRAST 02/08/2024 02:10:33 AM TECHNIQUE: CT of the head, facial bones and cervical spine was performed without the administration of intravenous contrast. Multiplanar reformatted images are provided for review. Automated exposure control, iterative reconstruction, and/or weight based adjustment of the mA/kV was utilized to reduce the radiation dose to as low as reasonably achievable. COMPARISON: CT head and c spine October 10, 2020 CLINICAL HISTORY: Facial trauma, blunt. FINDINGS: CT HEAD BRAIN  AND VENTRICLES: No acute intracranial hemorrhage. No mass effect or midline shift. No extra-axial fluid collection. No evidence of acute infarct. No hydrocephalus. Cerebral atrophy. SKULL AND SCALP: No acute skull fracture. No scalp hematoma. CT FACIAL BONES FACIAL BONES: No acute facial fracture. No mandibular dislocation. No suspicious bone lesion. ORBITS: No acute traumatic injury. SINUSES AND MASTOIDS: No acute abnormality. SOFT TISSUES: Right cheek and periorbital contusion. CT CERVICAL SPINE BONES AND ALIGNMENT: No acute fracture or traumatic malalignment. DEGENERATIVE CHANGES: Moderate to severe multilevel degenerative change including degenerative disc disease, endplate spurring, facet and uncovertebral hypertrophy with varying degrees of neural foraminal stenosis. SOFT TISSUES: No prevertebral soft tissue swelling. IMPRESSION: 1. No acute intracranial abnormality. 2. No acute fracture or traumatic malalignment of the cervical spine. 3. No acute fracture of the facial bones. Right face contusion. Electronically signed by: Gilmore Molt MD 02/08/2024 02:39 AM EDT RP Workstation: [id number]   CT Cervical Spine Wo Contrast Result Date: 02/08/2024 EXAM: CT HEAD,  FACIAL BONES AND CERVICAL SPINE WITHOUT CONTRAST 02/08/2024 02:10:33 AM TECHNIQUE: CT of the head, facial bones and cervical spine was performed without the administration of intravenous contrast. Multiplanar reformatted images are provided for review. Automated exposure control, iterative reconstruction, and/or weight based adjustment of the mA/kV was utilized to reduce the radiation dose to as low as reasonably achievable. COMPARISON: CT head and c spine October 10, 2020 CLINICAL HISTORY: Facial trauma, blunt. FINDINGS: CT HEAD BRAIN AND VENTRICLES: No acute intracranial hemorrhage. No mass effect or midline shift. No extra-axial fluid collection. No evidence of acute infarct. No hydrocephalus. Cerebral atrophy. SKULL AND SCALP: No acute skull fracture. No scalp hematoma. CT FACIAL BONES FACIAL BONES: No acute facial fracture. No mandibular dislocation. No suspicious bone lesion. ORBITS: No acute traumatic injury. SINUSES AND MASTOIDS: No acute abnormality. SOFT TISSUES: Right cheek and periorbital contusion. CT CERVICAL SPINE BONES AND ALIGNMENT: No acute fracture or traumatic malalignment. DEGENERATIVE CHANGES: Moderate to severe multilevel degenerative change including degenerative disc disease, endplate spurring, facet and uncovertebral hypertrophy with varying degrees of neural foraminal stenosis. SOFT TISSUES: No prevertebral soft tissue swelling. IMPRESSION: 1. No acute intracranial abnormality. 2. No acute fracture or traumatic malalignment of the cervical spine. 3. No acute fracture of the facial bones. Right face contusion. Electronically signed by: Gilmore Molt MD 02/08/2024 02:39 AM EDT RP Workstation: [id number]   CT Maxillofacial Wo Contrast Result Date: 02/08/2024 EXAM: CT HEAD, FACIAL BONES AND CERVICAL SPINE WITHOUT CONTRAST 02/08/2024 02:10:33 AM TECHNIQUE: CT of the head, facial bones and cervical spine was performed without the administration of intravenous contrast. Multiplanar reformatted  images are provided for review. Automated exposure control, iterative reconstruction, and/or weight based adjustment of the mA/kV was utilized to reduce the radiation dose to as low as reasonably achievable. COMPARISON: CT head and c spine October 10, 2020 CLINICAL HISTORY: Facial trauma, blunt. FINDINGS: CT HEAD BRAIN AND VENTRICLES: No acute intracranial hemorrhage. No mass effect or midline shift. No extra-axial fluid collection. No evidence of acute infarct. No hydrocephalus. Cerebral atrophy. SKULL AND SCALP: No acute skull fracture. No scalp hematoma. CT FACIAL BONES FACIAL BONES: No acute facial fracture. No mandibular dislocation. No suspicious bone lesion. ORBITS: No acute traumatic injury. SINUSES AND MASTOIDS: No acute abnormality. SOFT TISSUES: Right cheek and periorbital contusion. CT CERVICAL SPINE BONES AND ALIGNMENT: No acute fracture or traumatic malalignment. DEGENERATIVE CHANGES: Moderate to severe multilevel degenerative change including degenerative disc disease, endplate spurring, facet and uncovertebral hypertrophy with varying degrees of neural foraminal stenosis. SOFT TISSUES: No  prevertebral soft tissue swelling. IMPRESSION: 1. No acute intracranial abnormality. 2. No acute fracture or traumatic malalignment of the cervical spine. 3. No acute fracture of the facial bones. Right face contusion. Electronically signed by: Gilmore Molt MD 02/08/2024 02:39 AM EDT RP Workstation: [id number]   DG Chest Portable 1 View Result Date: 02/08/2024 CLINICAL DATA:  Fall, possible fever EXAM: PORTABLE CHEST 1 VIEW COMPARISON:  08/03/2022 FINDINGS: Heart and mediastinal contours are within normal limits. No focal opacities or effusions. No acute bony abnormality. IMPRESSION: No active disease. Electronically Signed   By: Franky Crease M.D.   On: 02/08/2024 01:49     PROCEDURES:  Critical Care performed: No      .1-3 Lead EKG Interpretation  Performed by: Sumaiya Arruda, Josette SAILOR, DO Authorized by:  Taino Maertens, Josette SAILOR, DO     Interpretation: abnormal     ECG rate:  68   ECG rate assessment: normal     Rhythm: atrial fibrillation     Ectopy: none     Conduction: normal       IMPRESSION / MDM / ASSESSMENT AND PLAN / ED COURSE  I reviewed the triage vital signs and the nursing notes.  Patient here after unwitnessed fall with obvious signs of head injury.  He is on Eliquis .  The patient is on the cardiac monitor to evaluate for evidence of arrhythmia and/or significant heart rate changes.   DIFFERENTIAL DIAGNOSIS (includes but not limited to):   Unwitnessed fall, head injury, facial fracture, facial contusion, cervical spine fracture, anemia, electrolyte derangement, UTI, dehydration, dementia  Patient's presentation is most consistent with acute presentation with potential threat to life or bodily function.  PLAN: Will obtain labs, urine, CT of the head, face, neck.  No other sign of traumatic injury on exam.  No open wounds.    MEDICATIONS GIVEN IN ED: Medications - No data to display   ED COURSE: CTs reviewed and interpreted by myself and the radiologist and shows soft tissue contusion but no fractures, intracranial hemorrhage.  Labs show normal hemoglobin.  Stable chronic kidney disease.  Normal electrolytes.  Troponin negative.  COVID, flu and RSV negative.  Chest x-ray reviewed and interpreted by myself and the radiologist and shows no acute abnormality.   Urine shows pyuria but no bacteria or other sign of infection.  Will send for culture but hold on treatment at this time given the pyuria is mild and he appears to be at his baseline mental status, afebrile and hemodynamically stable.  Abdominal exam benign.  Will discharge back to his nursing facility.   At this time, I do not feel there is any life-threatening condition present. I reviewed all nursing notes, vitals, pertinent previous records.  All lab and urine results, EKGs, imaging ordered have been independently  reviewed and interpreted by myself.  I reviewed all available radiology reports from any imaging ordered this visit.  Based on my assessment, I feel the patient is safe to be discharged home without further emergent workup and can continue workup as an outpatient as needed. Discussed all findings, treatment plan as well as usual and customary return precautions.  They verbalize understanding and are comfortable with this plan.  Outpatient follow-up has been provided as needed.  All questions have been answered.   CONSULTS:  none   OUTSIDE RECORDS REVIEWED: Reviewed last internal medicine note on 01/19/2024       FINAL CLINICAL IMPRESSION(S) / ED DIAGNOSES   Final diagnoses:  Unwitnessed fall  Injury of head,  initial encounter  Pyuria     Rx / DC Orders   ED Discharge Orders     None        Note:  This document was prepared using Dragon voice recognition software and may include unintentional dictation errors.   Neriyah Cercone, Josette SAILOR, DO 02/08/24 [phone]

## 2024-02-08 NOTE — Discharge Instructions (Signed)
 CT of the head, neck, face showed no acute abnormality other than a soft tissue contusion.  You may apply ice to this area several times a day as needed to help with pain and swelling.  It is safe to take Tylenol  as needed for pain control.  You may take 1000 mg every 6 hours as needed for pain.  Lab work showed no acute abnormality today.  Chest x-ray was clear.  COVID, flu and RSV were negative.  Urine did show small amount of white blood cells but no bacteria or other sign of infection.  A culture is pending.  You will be contacted if this is abnormal and antibiotics need to be started.

## 2024-02-08 NOTE — ED Notes (Signed)
 Patient transported to CT

## 2024-02-09 ENCOUNTER — Encounter: Payer: Self-pay | Admitting: Orthopedic Surgery

## 2024-02-09 ENCOUNTER — Non-Acute Institutional Stay (SKILLED_NURSING_FACILITY): Admitting: Orthopedic Surgery

## 2024-02-09 DIAGNOSIS — S0083XD Contusion of other part of head, subsequent encounter: Secondary | ICD-10-CM

## 2024-02-09 DIAGNOSIS — F03918 Unspecified dementia, unspecified severity, with other behavioral disturbance: Secondary | ICD-10-CM | POA: Diagnosis not present

## 2024-02-09 DIAGNOSIS — I10 Essential (primary) hypertension: Secondary | ICD-10-CM | POA: Diagnosis not present

## 2024-02-09 DIAGNOSIS — W19XXXA Unspecified fall, initial encounter: Secondary | ICD-10-CM | POA: Diagnosis not present

## 2024-02-09 LAB — URINE CULTURE: Culture: NO GROWTH

## 2024-02-09 NOTE — Progress Notes (Unsigned)
 Location:  Other [facility]) Nursing Home Room Number: 414 A Place of Service:  SNF (31) Provider:  Greig CHARLENA Cluster, NP   Patient Care Team: Abdul Fine, MD as PCP - General (Family Medicine)  Extended Emergency Contact Information Primary Emergency Contact: [name] Address: [address]          [place], [place] [address] Home Phone: [phone] Mobile Phone: [phone] Relation: Spouse Secondary Emergency Contact: Porco,[name] Mobile Phone: [phone] Relation: Son  Code Status:  DNR Goals of care: Advanced Directive information    01/11/2024   11:21 AM  Advanced Directives  Does Patient Have a Medical Advance Directive? No  Would patient like information on creating a medical advance directive? No - Patient declined     Chief Complaint  Patient presents with  . Follow-up    ED follow-up post fall     HPI:  Pt is a 88 y.o. male seen today for f/u s/p ED visit due to fall.   He currently resides on the skilled nursing unit at [facility]. PMH: dementia, HTN, PAF, CHF, CKD, BPH, OAB, depression and anxiety.   10/13 he had unwitnessed fall per nursing. He had bruising to right eye and face. Takes Eliquis  for PAF. Poor historian due to dementia. He was sent to ED for evaluation. CT head, cervical spine and maxillofacial without acute intracranial abnormality or fracture. He was noted to have right face contusion. CXR unremarkable. EKG indicated atrial fibrillation, HR 68. UA showed pyuria, culture sent> no growth. He returned back to baseline mentation. He was discharged back to TL.   Today, he is stimulated to voice. Appears sleepy. Nursing reports he ate breakfast. Right eye is swollen and bruised. He is unable to confirm any changes in vision.   Past Medical History:  Diagnosis Date  . Atrial fibrillation (HCC)    persistent   on NOAC  . Hypertension   . Vitamin B 12 deficiency    History reviewed. No pertinent surgical history.  Allergies   Allergen Reactions  . Donepezil  Hcl Other (See Comments)    Reaction not specified.    Outpatient Encounter Medications as of 02/09/2024  Medication Sig  . acetaminophen  (TYLENOL ) 500 MG tablet Take 1,000 mg by mouth every 8 (eight) hours as needed.  . apixaban  (ELIQUIS ) 5 MG TABS tablet Take 5 mg by mouth 2 (two) times daily.  . ARIPiprazole (ABILIFY) 5 MG tablet Take 5 mg by mouth daily.  . busPIRone (BUSPAR) 5 MG tablet Take 5 mg by mouth 3 (three) times daily.  . furosemide  (LASIX ) 40 MG tablet Take 40 mg by mouth daily.  SABRA ketoconazole (NIZORAL) 2 % shampoo Apply 1 Application topically daily. Wed and Sun  . losartan (COZAAR) 25 MG tablet Take 25 mg by mouth daily.  . memantine (NAMENDA) 10 MG tablet Take 10 mg by mouth 2 (two) times daily.  . Multiple Vitamins-Minerals (MULTIVITAMIN WITH MINERALS) tablet Take 1 tablet by mouth daily.  . potassium chloride  (KLOR-CON ) 20 MEQ packet Take 40 mEq by mouth daily.  . sertraline  (ZOLOFT ) 100 MG tablet Take 100 mg by mouth daily.  . tamsulosin (FLOMAX) 0.4 MG CAPS capsule Take 0.4 mg by mouth at bedtime.  . Vibegron (GEMTESA) 75 MG TABS Take 1 tablet by mouth daily.  . Zinc Oxide (TRIPLE PASTE) 12.8 % ointment Apply 1 Application topically daily. Every shift.   No facility-administered encounter medications on file as of 02/09/2024.    Review of Systems  Immunization  History  Administered Date(s) Administered  . Influenza Inj Mdck Quad Pf 01/14/2019  . Influenza,inj,Quad PF,6+ Mos 03/21/2020  . Influenza-Unspecified 01/18/2016, 01/16/2017, 01/29/2018, 02/18/2023  . Moderna Covid-19 Fall Seasonal Vaccine 25yrs & older 08/07/2023  . Moderna Sars-Covid-2 Vaccination 12/01/2019, 12/29/2019  . Td 10/26/2017  . Td (Adult),5 Lf Tetanus Toxid, Preservative Free 10/26/2017  . Td (Adult),unspecified 05/01/2008  . Tdap 05/01/2008  . Zoster, Live 04/28/2009   Pertinent  Health Maintenance Due  Topic Date Due  . Influenza Vaccine   11/27/2023      05/03/2022    8:10 AM 05/03/2022    8:00 PM 05/04/2022    8:35 AM 05/04/2022    8:00 PM 05/05/2022   11:00 AM  Fall Risk  (RETIRED) Patient Fall Risk Level High fall risk  High fall risk  High fall risk  High fall risk  High fall risk      Data saved with a previous flowsheet row definition   Functional Status Survey:    Vitals:   02/09/24 1219  BP: (S) (!) 145/88  Pulse: 82  Resp: 18  Temp: 99.2 F (37.3 C)  SpO2: 92%  Weight: 216 lb 9.6 oz (98.2 kg)  Height: 6' (1.829 m)   Body mass index is 29.38 kg/m. Physical Exam  Labs reviewed: Recent Labs    07/16/23 0000 01/14/24 0000 02/08/24 0133  NA 138 137 139  K 3.7 3.7 3.8  CL 101 103 101  CO2 29* 29* 28  GLUCOSE  --   --  88  BUN 18 23* 18  CREATININE 1.4* 1.7* 1.31*  CALCIUM 8.8 8.6* 9.2   Recent Labs    02/16/23 0000 02/08/24 0133  AST 18 21  ALT 13 13  ALKPHOS 100 120  BILITOT  --  0.8  PROT  --  7.3  ALBUMIN 3.9 4.1   Recent Labs    08/06/23 0000 01/14/24 0000 02/08/24 0133  WBC 6.8 7.8 6.9  NEUTROABS 4,202.00  --  4.3  HGB 10.5* 12.2* 13.2  HCT 33* 37* 40.6  MCV  --   --  82.2  PLT 209 189 199   Lab Results  Component Value Date   TSH 1.511 08/04/2022   No results found for: HGBA1C No results found for: CHOL, HDL, LDLCALC, LDLDIRECT, TRIG, CHOLHDL  Significant Diagnostic Results in last 30 days:  CT HEAD WO CONTRAST ( ) Result Date: 02/08/2024 EXAM: CT HEAD, FACIAL BONES AND CERVICAL SPINE WITHOUT CONTRAST 02/08/2024 02:10:33 AM TECHNIQUE: CT of the head, facial bones and cervical spine was performed without the administration of intravenous contrast. Multiplanar reformatted images are provided for review. Automated exposure control, iterative reconstruction, and/or weight based adjustment of the mA/kV was utilized to reduce the radiation dose to as low as reasonably achievable. COMPARISON: CT head and c spine October 10, 2020 CLINICAL HISTORY: Facial trauma,  blunt. FINDINGS: CT HEAD BRAIN AND VENTRICLES: No acute intracranial hemorrhage. No mass effect or midline shift. No extra-axial fluid collection. No evidence of acute infarct. No hydrocephalus. Cerebral atrophy. SKULL AND SCALP: No acute skull fracture. No scalp hematoma. CT FACIAL BONES FACIAL BONES: No acute facial fracture. No mandibular dislocation. No suspicious bone lesion. ORBITS: No acute traumatic injury. SINUSES AND MASTOIDS: No acute abnormality. SOFT TISSUES: Right cheek and periorbital contusion. CT CERVICAL SPINE BONES AND ALIGNMENT: No acute fracture or traumatic malalignment. DEGENERATIVE CHANGES: Moderate to severe multilevel degenerative change including degenerative disc disease, endplate spurring, facet and uncovertebral hypertrophy with varying degrees of neural  foraminal stenosis. SOFT TISSUES: No prevertebral soft tissue swelling. IMPRESSION: 1. No acute intracranial abnormality. 2. No acute fracture or traumatic malalignment of the cervical spine. 3. No acute fracture of the facial bones. Right face contusion. Electronically signed by: Gilmore Molt MD 02/08/2024 02:39 AM EDT RP Workstation: [id number]   CT Cervical Spine Wo Contrast Result Date: 02/08/2024 EXAM: CT HEAD, FACIAL BONES AND CERVICAL SPINE WITHOUT CONTRAST 02/08/2024 02:10:33 AM TECHNIQUE: CT of the head, facial bones and cervical spine was performed without the administration of intravenous contrast. Multiplanar reformatted images are provided for review. Automated exposure control, iterative reconstruction, and/or weight based adjustment of the mA/kV was utilized to reduce the radiation dose to as low as reasonably achievable. COMPARISON: CT head and c spine October 10, 2020 CLINICAL HISTORY: Facial trauma, blunt. FINDINGS: CT HEAD BRAIN AND VENTRICLES: No acute intracranial hemorrhage. No mass effect or midline shift. No extra-axial fluid collection. No evidence of acute infarct. No hydrocephalus. Cerebral atrophy. SKULL  AND SCALP: No acute skull fracture. No scalp hematoma. CT FACIAL BONES FACIAL BONES: No acute facial fracture. No mandibular dislocation. No suspicious bone lesion. ORBITS: No acute traumatic injury. SINUSES AND MASTOIDS: No acute abnormality. SOFT TISSUES: Right cheek and periorbital contusion. CT CERVICAL SPINE BONES AND ALIGNMENT: No acute fracture or traumatic malalignment. DEGENERATIVE CHANGES: Moderate to severe multilevel degenerative change including degenerative disc disease, endplate spurring, facet and uncovertebral hypertrophy with varying degrees of neural foraminal stenosis. SOFT TISSUES: No prevertebral soft tissue swelling. IMPRESSION: 1. No acute intracranial abnormality. 2. No acute fracture or traumatic malalignment of the cervical spine. 3. No acute fracture of the facial bones. Right face contusion. Electronically signed by: Gilmore Molt MD 02/08/2024 02:39 AM EDT RP Workstation: [id number]   CT Maxillofacial Wo Contrast Result Date: 02/08/2024 EXAM: CT HEAD, FACIAL BONES AND CERVICAL SPINE WITHOUT CONTRAST 02/08/2024 02:10:33 AM TECHNIQUE: CT of the head, facial bones and cervical spine was performed without the administration of intravenous contrast. Multiplanar reformatted images are provided for review. Automated exposure control, iterative reconstruction, and/or weight based adjustment of the mA/kV was utilized to reduce the radiation dose to as low as reasonably achievable. COMPARISON: CT head and c spine October 10, 2020 CLINICAL HISTORY: Facial trauma, blunt. FINDINGS: CT HEAD BRAIN AND VENTRICLES: No acute intracranial hemorrhage. No mass effect or midline shift. No extra-axial fluid collection. No evidence of acute infarct. No hydrocephalus. Cerebral atrophy. SKULL AND SCALP: No acute skull fracture. No scalp hematoma. CT FACIAL BONES FACIAL BONES: No acute facial fracture. No mandibular dislocation. No suspicious bone lesion. ORBITS: No acute traumatic injury. SINUSES AND  MASTOIDS: No acute abnormality. SOFT TISSUES: Right cheek and periorbital contusion. CT CERVICAL SPINE BONES AND ALIGNMENT: No acute fracture or traumatic malalignment. DEGENERATIVE CHANGES: Moderate to severe multilevel degenerative change including degenerative disc disease, endplate spurring, facet and uncovertebral hypertrophy with varying degrees of neural foraminal stenosis. SOFT TISSUES: No prevertebral soft tissue swelling. IMPRESSION: 1. No acute intracranial abnormality. 2. No acute fracture or traumatic malalignment of the cervical spine. 3. No acute fracture of the facial bones. Right face contusion. Electronically signed by: Gilmore Molt MD 02/08/2024 02:39 AM EDT RP Workstation: [id number]   DG Chest Portable 1 View Result Date: 02/08/2024 CLINICAL DATA:  Fall, possible fever EXAM: PORTABLE CHEST 1 VIEW COMPARISON:  08/03/2022 FINDINGS: Heart and mediastinal contours are within normal limits. No focal opacities or effusions. No acute bony abnormality. IMPRESSION: No active disease. Electronically Signed   By: Franky Crease M.D.   On: 02/08/2024  01:49    Assessment/Plan There are no diagnoses linked to this encounter.   Family/ staff Communication: ***  Labs/tests ordered:  ***

## 2024-02-29 LAB — BASIC METABOLIC PANEL WITH GFR
BUN: 14 (ref 4–21)
CO2: 31 — AB (ref 13–22)
Chloride: 100 (ref 99–108)
Creatinine: 1.3 (ref 0.6–1.3)
Glucose: 67
Potassium: 4.2 meq/L (ref 3.5–5.1)
Sodium: 138 (ref 137–147)

## 2024-02-29 LAB — COMPREHENSIVE METABOLIC PANEL WITH GFR
Calcium: 9 (ref 8.7–10.7)
eGFR: 54

## 2024-03-03 ENCOUNTER — Encounter: Payer: Self-pay | Admitting: Nurse Practitioner

## 2024-03-03 ENCOUNTER — Non-Acute Institutional Stay (SKILLED_NURSING_FACILITY): Payer: Self-pay | Admitting: Nurse Practitioner

## 2024-03-03 DIAGNOSIS — I10 Essential (primary) hypertension: Secondary | ICD-10-CM

## 2024-03-03 DIAGNOSIS — N1831 Chronic kidney disease, stage 3a: Secondary | ICD-10-CM

## 2024-03-03 DIAGNOSIS — I509 Heart failure, unspecified: Secondary | ICD-10-CM | POA: Diagnosis not present

## 2024-03-03 DIAGNOSIS — I482 Chronic atrial fibrillation, unspecified: Secondary | ICD-10-CM

## 2024-03-03 DIAGNOSIS — F03B0 Unspecified dementia, moderate, without behavioral disturbance, psychotic disturbance, mood disturbance, and anxiety: Secondary | ICD-10-CM

## 2024-03-03 DIAGNOSIS — N401 Enlarged prostate with lower urinary tract symptoms: Secondary | ICD-10-CM

## 2024-03-03 DIAGNOSIS — F03918 Unspecified dementia, unspecified severity, with other behavioral disturbance: Secondary | ICD-10-CM

## 2024-03-03 DIAGNOSIS — F3341 Major depressive disorder, recurrent, in partial remission: Secondary | ICD-10-CM

## 2024-03-03 DIAGNOSIS — N3281 Overactive bladder: Secondary | ICD-10-CM

## 2024-03-03 DIAGNOSIS — Z5181 Encounter for therapeutic drug level monitoring: Secondary | ICD-10-CM

## 2024-03-03 DIAGNOSIS — R350 Frequency of micturition: Secondary | ICD-10-CM

## 2024-03-03 NOTE — Assessment & Plan Note (Signed)
 Bp stable at this time.  Will continue current regimen Monitor bmp

## 2024-03-03 NOTE — Assessment & Plan Note (Signed)
 Stable, no acute changes in cognitive or functional status, continue supportive care. Behaviors are stable on current regimen. Continues on Abilify.

## 2024-03-03 NOTE — Assessment & Plan Note (Signed)
 Chronic and stable Encourage proper hydration Follow metabolic panel Avoid nephrotoxic meds (NSAIDS)

## 2024-03-03 NOTE — Assessment & Plan Note (Signed)
 Controlled on gemtesa. Will continue current regimen.

## 2024-03-03 NOTE — Assessment & Plan Note (Signed)
 03/03/2024- decrease zoloft  from 125 mg daily to 100 mg daily

## 2024-03-03 NOTE — Assessment & Plan Note (Signed)
 Euvolemic, continues on lasix

## 2024-03-03 NOTE — Assessment & Plan Note (Signed)
 Rate controlled, continues on eliquis for anticoagulation

## 2024-03-03 NOTE — Assessment & Plan Note (Signed)
 Stable on zoloft  and buspar.  Will attempt GDR of zoloft  and decrease to 100 mg daily

## 2024-03-03 NOTE — Assessment & Plan Note (Signed)
 Stable on flomax, no increase in urinary symptoms.

## 2024-03-03 NOTE — Progress Notes (Signed)
 Location:  [facility].  Nursing Home Room Number: [facility] [id number] Place of Service:  SNF [phone]) Harlene An, NP  PCP: Laurence Locus, DO  Patient Care Team: Laurence Locus, DO as PCP - General (Internal Medicine)  Extended Emergency Contact Information Primary Emergency Contact: [name] Address: [address]          [place], [place] [address] Home Phone: [phone] Mobile Phone: [phone] Relation: Spouse Secondary Emergency Contact: Micucci,[name] Mobile Phone: [phone] Relation: Son  Goals of care: Advanced Directive information    01/11/2024   11:21 AM  Advanced Directives  Does Patient Have a Medical Advance Directive? No  Would patient like information on creating a medical advance directive? No - Patient declined     Chief Complaint  Patient presents with   Medical Management of Chronic Issues    Medical Management of Chronic Issues.     HPI:  Pt is a 88 y.o. male seen today for medical management of chronic disease. Pt with hx of dementia, overactive bladder, BPH, CHF.  He has been doing well. Nursing without concerns.  He had a fall a few weeks ago and has faded bruise on left check. Denies any pain.  He eats well No LE edema or shortness of breath reported.  Continues on eliqus due to a fib- no unusual bleeding or bruising.  Mood has been doing well. Denies anxiety or depression No behaviors noted.    Past Medical History:  Diagnosis Date   Atrial fibrillation (HCC)    persistent   on NOAC   Hypertension    Vitamin B 12 deficiency    History reviewed. No pertinent surgical history.  Allergies  Allergen Reactions   Donepezil  Hcl Other (See Comments)    Reaction not specified.    Outpatient Encounter Medications as of 03/03/2024  Medication Sig   acetaminophen  (TYLENOL ) 500 MG tablet Take 1,000 mg by mouth every 8 (eight) hours as needed.   apixaban  (ELIQUIS ) 5 MG TABS tablet Take 5 mg by mouth 2 (two) times daily.    ARIPiprazole (ABILIFY) 5 MG tablet Take 5 mg by mouth daily.   busPIRone (BUSPAR) 5 MG tablet Take 5 mg by mouth 3 (three) times daily.   furosemide  (LASIX ) 40 MG tablet Take 40 mg by mouth daily.   ketoconazole (NIZORAL) 2 % shampoo Apply 1 Application topically daily. Wed and Sun   losartan (COZAAR) 25 MG tablet Take 25 mg by mouth daily.   memantine (NAMENDA) 10 MG tablet Take 10 mg by mouth 2 (two) times daily.   Multiple Vitamins-Minerals (MULTIVITAMIN WITH MINERALS) tablet Take 1 tablet by mouth daily.   nystatin (MYCOSTATIN/NYSTOP) powder Apply 1 Application topically 4 (four) times daily.   potassium chloride  (KLOR-CON ) 20 MEQ packet Take 40 mEq by mouth daily.   sertraline  (ZOLOFT ) 100 MG tablet Take 150 mg by mouth at bedtime.   tamsulosin (FLOMAX) 0.4 MG CAPS capsule Take 0.4 mg by mouth at bedtime.   Vibegron (GEMTESA) 75 MG TABS Take 1 tablet by mouth daily.   Zinc Oxide (TRIPLE PASTE) 12.8 % ointment Apply 1 Application topically daily. Every shift.   No facility-administered encounter medications on file as of 03/03/2024.    Review of Systems  Unable to perform ROS: Dementia     Immunization History  Administered Date(s) Administered   Influenza Inj Mdck Quad Pf 01/14/2019   Influenza,inj,Quad PF,6+ Mos 03/21/2020   Influenza-Unspecified 01/18/2016, 01/16/2017, 01/29/2018, 02/18/2023, 02/09/2024   Moderna Covid-19 Fall Seasonal  Vaccine 5yrs & older 08/07/2023   Moderna Sars-Covid-2 Vaccination 12/01/2019, 12/29/2019   Td 10/26/2017   Td (Adult),5 Lf Tetanus Toxid, Preservative Free 10/26/2017   Td (Adult),unspecified 05/01/2008   Tdap 05/01/2008   Unspecified SARS-COV-2 Vaccination 04/28/2023, 08/07/2023   Zoster, Live 04/28/2009   Pertinent  Health Maintenance Due  Topic Date Due   Influenza Vaccine  Completed      05/03/2022    8:00 PM 05/04/2022    8:35 AM 05/04/2022    8:00 PM 05/05/2022   11:00 AM 02/10/2024    4:37 PM  Fall Risk  Falls in the past year?      1  Was there an injury with Fall?     1  Fall Risk Category Calculator     3  (RETIRED) Patient Fall Risk Level High fall risk  High fall risk  High fall risk  High fall risk    Patient at Risk for Falls Due to     History of fall(s);Impaired balance/gait  Fall risk Follow up     Falls evaluation completed;Education provided     Data saved with a previous flowsheet row definition   Functional Status Survey:    Vitals:   03/03/24 1042  BP: 129/77  Pulse: 65  Resp: 18  Temp: 97.6 F (36.4 C)  SpO2: 97%  Weight: 209 lb 12.8 oz (95.2 kg)  Height: 6' (1.829 m)   Body mass index is 28.45 kg/m. Physical Exam Constitutional:      General: He is not in acute distress.    Appearance: He is well-developed. He is not diaphoretic.  HENT:     Head: Normocephalic and atraumatic.     Right Ear: External ear normal.     Left Ear: External ear normal.     Mouth/Throat:     Pharynx: No oropharyngeal exudate.  Eyes:     Conjunctiva/sclera: Conjunctivae normal.     Pupils: Pupils are equal, round, and reactive to light.  Cardiovascular:     Rate and Rhythm: Normal rate and regular rhythm.     Heart sounds: Normal heart sounds.  Pulmonary:     Effort: Pulmonary effort is normal.     Breath sounds: Normal breath sounds.  Abdominal:     General: Bowel sounds are normal.     Palpations: Abdomen is soft.  Musculoskeletal:        General: No tenderness.     Cervical back: Normal range of motion and neck supple.     Right lower leg: No edema.     Left lower leg: No edema.  Skin:    General: Skin is warm and dry.  Neurological:     Mental Status: He is alert.     Motor: Weakness present.     Gait: Gait abnormal.     Labs reviewed: Recent Labs    01/14/24 0000 02/08/24 0133 02/29/24 0000  NA 137 139 138  K 3.7 3.8 4.2  CL 103 101 100  CO2 29* 28 31*  GLUCOSE  --  88  --   BUN 23* 18 14  CREATININE 1.7* 1.31* 1.3  CALCIUM 8.6* 9.2 9.0   Recent Labs    02/08/24 0133   AST 21  ALT 13  ALKPHOS 120  BILITOT 0.8  PROT 7.3  ALBUMIN 4.1   Recent Labs    08/06/23 0000 01/14/24 0000 02/08/24 0133  WBC 6.8 7.8 6.9  NEUTROABS 4,202.00  --  4.3  HGB 10.5* 12.2* 13.2  HCT 33* 37* 40.6  MCV  --   --  82.2  PLT 209 189 199   Lab Results  Component Value Date   TSH 1.511 08/04/2022   No results found for: HGBA1C No results found for: CHOL, HDL, LDLCALC, LDLDIRECT, TRIG, CHOLHDL  Significant Diagnostic Results in last 30 days:  CT HEAD WO CONTRAST ( ) Result Date: 02/08/2024 EXAM: CT HEAD, FACIAL BONES AND CERVICAL SPINE WITHOUT CONTRAST 02/08/2024 02:10:33 AM TECHNIQUE: CT of the head, facial bones and cervical spine was performed without the administration of intravenous contrast. Multiplanar reformatted images are provided for review. Automated exposure control, iterative reconstruction, and/or weight based adjustment of the mA/kV was utilized to reduce the radiation dose to as low as reasonably achievable. COMPARISON: CT head and c spine October 10, 2020 CLINICAL HISTORY: Facial trauma, blunt. FINDINGS: CT HEAD BRAIN AND VENTRICLES: No acute intracranial hemorrhage. No mass effect or midline shift. No extra-axial fluid collection. No evidence of acute infarct. No hydrocephalus. Cerebral atrophy. SKULL AND SCALP: No acute skull fracture. No scalp hematoma. CT FACIAL BONES FACIAL BONES: No acute facial fracture. No mandibular dislocation. No suspicious bone lesion. ORBITS: No acute traumatic injury. SINUSES AND MASTOIDS: No acute abnormality. SOFT TISSUES: Right cheek and periorbital contusion. CT CERVICAL SPINE BONES AND ALIGNMENT: No acute fracture or traumatic malalignment. DEGENERATIVE CHANGES: Moderate to severe multilevel degenerative change including degenerative disc disease, endplate spurring, facet and uncovertebral hypertrophy with varying degrees of neural foraminal stenosis. SOFT TISSUES: No prevertebral soft tissue swelling. IMPRESSION:  1. No acute intracranial abnormality. 2. No acute fracture or traumatic malalignment of the cervical spine. 3. No acute fracture of the facial bones. Right face contusion. Electronically signed by: Gilmore Molt MD 02/08/2024 02:39 AM EDT RP Workstation: [id number]   CT Cervical Spine Wo Contrast Result Date: 02/08/2024 EXAM: CT HEAD, FACIAL BONES AND CERVICAL SPINE WITHOUT CONTRAST 02/08/2024 02:10:33 AM TECHNIQUE: CT of the head, facial bones and cervical spine was performed without the administration of intravenous contrast. Multiplanar reformatted images are provided for review. Automated exposure control, iterative reconstruction, and/or weight based adjustment of the mA/kV was utilized to reduce the radiation dose to as low as reasonably achievable. COMPARISON: CT head and c spine October 10, 2020 CLINICAL HISTORY: Facial trauma, blunt. FINDINGS: CT HEAD BRAIN AND VENTRICLES: No acute intracranial hemorrhage. No mass effect or midline shift. No extra-axial fluid collection. No evidence of acute infarct. No hydrocephalus. Cerebral atrophy. SKULL AND SCALP: No acute skull fracture. No scalp hematoma. CT FACIAL BONES FACIAL BONES: No acute facial fracture. No mandibular dislocation. No suspicious bone lesion. ORBITS: No acute traumatic injury. SINUSES AND MASTOIDS: No acute abnormality. SOFT TISSUES: Right cheek and periorbital contusion. CT CERVICAL SPINE BONES AND ALIGNMENT: No acute fracture or traumatic malalignment. DEGENERATIVE CHANGES: Moderate to severe multilevel degenerative change including degenerative disc disease, endplate spurring, facet and uncovertebral hypertrophy with varying degrees of neural foraminal stenosis. SOFT TISSUES: No prevertebral soft tissue swelling. IMPRESSION: 1. No acute intracranial abnormality. 2. No acute fracture or traumatic malalignment of the cervical spine. 3. No acute fracture of the facial bones. Right face contusion. Electronically signed by: Gilmore Molt MD  02/08/2024 02:39 AM EDT RP Workstation: [id number]   CT Maxillofacial Wo Contrast Result Date: 02/08/2024 EXAM: CT HEAD, FACIAL BONES AND CERVICAL SPINE WITHOUT CONTRAST 02/08/2024 02:10:33 AM TECHNIQUE: CT of the head, facial bones and cervical spine was performed without the administration of intravenous contrast. Multiplanar reformatted images are provided for review. Automated exposure control, iterative reconstruction, and/or  weight based adjustment of the mA/kV was utilized to reduce the radiation dose to as low as reasonably achievable. COMPARISON: CT head and c spine October 10, 2020 CLINICAL HISTORY: Facial trauma, blunt. FINDINGS: CT HEAD BRAIN AND VENTRICLES: No acute intracranial hemorrhage. No mass effect or midline shift. No extra-axial fluid collection. No evidence of acute infarct. No hydrocephalus. Cerebral atrophy. SKULL AND SCALP: No acute skull fracture. No scalp hematoma. CT FACIAL BONES FACIAL BONES: No acute facial fracture. No mandibular dislocation. No suspicious bone lesion. ORBITS: No acute traumatic injury. SINUSES AND MASTOIDS: No acute abnormality. SOFT TISSUES: Right cheek and periorbital contusion. CT CERVICAL SPINE BONES AND ALIGNMENT: No acute fracture or traumatic malalignment. DEGENERATIVE CHANGES: Moderate to severe multilevel degenerative change including degenerative disc disease, endplate spurring, facet and uncovertebral hypertrophy with varying degrees of neural foraminal stenosis. SOFT TISSUES: No prevertebral soft tissue swelling. IMPRESSION: 1. No acute intracranial abnormality. 2. No acute fracture or traumatic malalignment of the cervical spine. 3. No acute fracture of the facial bones. Right face contusion. Electronically signed by: Gilmore Molt MD 02/08/2024 02:39 AM EDT RP Workstation: [id number]   DG Chest Portable 1 View Result Date: 02/08/2024 CLINICAL DATA:  Fall, possible fever EXAM: PORTABLE CHEST 1 VIEW COMPARISON:  08/03/2022 FINDINGS: Heart and  mediastinal contours are within normal limits. No focal opacities or effusions. No acute bony abnormality. IMPRESSION: No active disease. Electronically Signed   By: Franky Crease M.D.   On: 02/08/2024 01:49    Assessment/Plan Encounter for medication titration 03/03/2024- decrease zoloft  from 125 mg daily to 100 mg daily   Depression Stable on zoloft  and buspar.  Will attempt GDR of zoloft  and decrease to 100 mg daily   Stage 3a chronic kidney disease (HCC) Chronic and stable Encourage proper hydration Follow metabolic panel Avoid nephrotoxic meds (NSAIDS)  Overactive bladder Controlled on gemtesa. Will continue current regimen.   Essential hypertension Bp stable at this time.  Will continue current regimen Monitor bmp  Moderate dementia without behavioral disturbance, psychotic disturbance, mood disturbance, or anxiety, unspecified dementia type (HCC) Stable, no acute changes in cognitive or functional status, continue supportive care. Behaviors are stable on current regimen. Continues on Abilify.    Congestive heart failure, unspecified HF chronicity, unspecified heart failure type (HCC) Euvolemic, continues on lasix .   Benign prostatic hyperplasia with urinary frequency Stable on flomax, no increase in urinary symptoms.   Atrial fibrillation, chronic (HCC) Rate controlled, continues on eliquis  for anticoagulation      Schneur Crowson K. Caro BODILY [facility] [phone]

## 2024-03-15 NOTE — Progress Notes (Signed)
 Pharmacy Quality Measure Review  This patient is appearing on a report for being at risk of failing the adherence measure for hypertension (ACEi/ARB) medications this calendar year.   Medication: losartan 25 mg Last fill date: 01/20/24 for 30 day supply  The patient receives medications from [facility], which dispenses in 7-day cycle packs and then monthly composite billing. The patient has been having their losartan filled monthly.  Woodie Jock, PharmD PGY1 Pharmacy Resident  03/15/2024

## 2024-03-31 ENCOUNTER — Encounter: Payer: Self-pay | Admitting: Nurse Practitioner

## 2024-03-31 ENCOUNTER — Non-Acute Institutional Stay (SKILLED_NURSING_FACILITY): Payer: Self-pay | Admitting: Nurse Practitioner

## 2024-03-31 DIAGNOSIS — Z5181 Encounter for therapeutic drug level monitoring: Secondary | ICD-10-CM

## 2024-03-31 DIAGNOSIS — N1831 Chronic kidney disease, stage 3a: Secondary | ICD-10-CM

## 2024-03-31 DIAGNOSIS — F3341 Major depressive disorder, recurrent, in partial remission: Secondary | ICD-10-CM

## 2024-03-31 DIAGNOSIS — F03B0 Unspecified dementia, moderate, without behavioral disturbance, psychotic disturbance, mood disturbance, and anxiety: Secondary | ICD-10-CM

## 2024-03-31 DIAGNOSIS — I10 Essential (primary) hypertension: Secondary | ICD-10-CM

## 2024-03-31 DIAGNOSIS — N3281 Overactive bladder: Secondary | ICD-10-CM

## 2024-03-31 DIAGNOSIS — N401 Enlarged prostate with lower urinary tract symptoms: Secondary | ICD-10-CM

## 2024-03-31 DIAGNOSIS — F03918 Unspecified dementia, unspecified severity, with other behavioral disturbance: Secondary | ICD-10-CM

## 2024-03-31 DIAGNOSIS — R350 Frequency of micturition: Secondary | ICD-10-CM

## 2024-03-31 DIAGNOSIS — I509 Heart failure, unspecified: Secondary | ICD-10-CM

## 2024-03-31 DIAGNOSIS — I482 Chronic atrial fibrillation, unspecified: Secondary | ICD-10-CM

## 2024-03-31 NOTE — Assessment & Plan Note (Signed)
 Stable on zoloft  and buspar.  Will attempt GDR of zoloft  and decrease to 75 mg daily

## 2024-03-31 NOTE — Assessment & Plan Note (Signed)
 Euvolemic, continues on lasix

## 2024-03-31 NOTE — Assessment & Plan Note (Signed)
 Chronic and stable Encourage proper hydration Follow metabolic panel Avoid nephrotoxic meds (NSAIDS)

## 2024-03-31 NOTE — Assessment & Plan Note (Addendum)
 03/31/2024- tolerated dose reduction- will decrease zoloft  to 75 mg daily

## 2024-03-31 NOTE — Assessment & Plan Note (Signed)
 Controlled on gemtesa. Will continue current regimen.

## 2024-03-31 NOTE — Assessment & Plan Note (Signed)
 Bp stable at this time.  Will continue current regimen Monitor bmp

## 2024-03-31 NOTE — Assessment & Plan Note (Signed)
 Stable on flomax, no increase in urinary symptoms.

## 2024-03-31 NOTE — Assessment & Plan Note (Signed)
 Rate controlled, continues on eliquis for anticoagulation

## 2024-03-31 NOTE — Assessment & Plan Note (Signed)
 Stable, no acute changes in cognitive or functional status, continue supportive care. Behaviors are stable on current regimen. Continues on Abilify.

## 2024-03-31 NOTE — Progress Notes (Signed)
 Location:  [facility].  Nursing Home Room Number: Le[facility] [id number] Place of Service:  SNF [phone]) Harlene An, NP  PCP: Laurence Locus, DO  Patient Care Team: Laurence Locus, DO as PCP - General (Internal Medicine)  Extended Emergency Contact Information Primary Emergency Contact: [name] Address: [address]          [place], [place] [address] Home Phone: [phone] Mobile Phone: [phone] Relation: Spouse Secondary Emergency Contact: Simkins,[name] Mobile Phone: [phone] Relation: Son  Goals of care: Advanced Directive information    03/31/2024    1:07 PM  Advanced Directives  Does Patient Have a Medical Advance Directive? Yes  Type of Advance Directive Out of facility DNR (pink MOST or yellow form)  Does patient want to make changes to medical advance directive? No - Patient declined     Chief Complaint  Patient presents with   Medical Management of Chronic Issues    Medical Management of Chronic Issues.     HPI:  Pt is a 88 y.o. male seen today for medical management of chronic disease. Pt with dementia, OAB, depression, CKD, BPH, CHF , a fib He has been doing well over the last month.  No acute issues per nursing.  Has done well with GDR of zoloft - no mood changes, anxiety or depression.  He eats well- on puree and eating double portions.  Weight stable in the last month    Past Medical History:  Diagnosis Date   Atrial fibrillation (HCC)    persistent   on NOAC   Hypertension    Vitamin B 12 deficiency    History reviewed. No pertinent surgical history.  Allergies  Allergen Reactions   Donepezil  Hcl Other (See Comments)    Reaction not specified.    Outpatient Encounter Medications as of 03/31/2024  Medication Sig   acetaminophen  (TYLENOL ) 500 MG tablet Take 1,000 mg by mouth every 8 (eight) hours as needed.   apixaban  (ELIQUIS ) 5 MG TABS tablet Take 5 mg by mouth 2 (two) times daily.   ARIPiprazole (ABILIFY) 5 MG tablet Take 5  mg by mouth daily.   busPIRone (BUSPAR) 5 MG tablet Take 5 mg by mouth 3 (three) times daily.   furosemide  (LASIX ) 40 MG tablet Take 40 mg by mouth daily.   ketoconazole (NIZORAL) 2 % shampoo Apply 1 Application topically daily. Wed and Sun   losartan (COZAAR) 25 MG tablet Take 25 mg by mouth daily.   memantine (NAMENDA) 10 MG tablet Take 10 mg by mouth 2 (two) times daily.   Multiple Vitamins-Minerals (MULTIVITAMIN WITH MINERALS) tablet Take 1 tablet by mouth daily.   potassium chloride  (KLOR-CON ) 20 MEQ packet Take 40 mEq by mouth daily.   sertraline  (ZOLOFT ) 100 MG tablet Take 100 mg by mouth at bedtime.   tamsulosin (FLOMAX) 0.4 MG CAPS capsule Take 0.4 mg by mouth at bedtime.   Vibegron (GEMTESA) 75 MG TABS Take 1 tablet by mouth daily.   Zinc Oxide (TRIPLE PASTE) 12.8 % ointment Apply 1 Application topically daily. Every shift.   nystatin (MYCOSTATIN/NYSTOP) powder Apply 1 Application topically 4 (four) times daily. (Patient not taking: Reported on 03/31/2024)   No facility-administered encounter medications on file as of 03/31/2024.    Review of Systems  Unable to perform ROS: Dementia     Immunization History  Administered Date(s) Administered   Influenza Inj Mdck Quad Pf 01/14/2019   Influenza,inj,Quad PF,6+ Mos 03/21/2020   Influenza-Unspecified 01/18/2016, 01/16/2017, 01/29/2018, 02/18/2023, 02/09/2024   Moderna  Covid-19 Fall Seasonal Vaccine 66yrs & older 08/07/2023   Moderna Sars-Covid-2 Vaccination 12/01/2019, 12/29/2019   Td 10/26/2017   Td (Adult),5 Lf Tetanus Toxid, Preservative Free 10/26/2017   Td (Adult),unspecified 05/01/2008   Tdap 05/01/2008   Unspecified SARS-COV-2 Vaccination 04/28/2023, 08/07/2023   Zoster, Live 04/28/2009   Pertinent  Health Maintenance Due  Topic Date Due   Influenza Vaccine  Completed      05/03/2022    8:00 PM 05/04/2022    8:35 AM 05/04/2022    8:00 PM 05/05/2022   11:00 AM 02/10/2024    4:37 PM  Fall Risk  Falls in the past year?      1  Was there an injury with Fall?     1   Fall Risk Category Calculator     3  (RETIRED) Patient Fall Risk Level High fall risk  High fall risk  High fall risk  High fall risk    Patient at Risk for Falls Due to     History of fall(s);Impaired balance/gait  Fall risk Follow up     Falls evaluation completed;Education provided     Data saved with a previous flowsheet row definition   Functional Status Survey:    Vitals:   03/31/24 1248  BP: (!) 146/79  Pulse: (!) 57  Resp: 16  Temp: 97.9 F (36.6 C)  SpO2: 94%  Weight: 207 lb 3.2 oz (94 kg)  Height: 6' (1.829 m)   Body mass index is 28.1 kg/m. Physical Exam Constitutional:      General: He is not in acute distress.    Appearance: He is well-developed. He is not diaphoretic.  HENT:     Head: Normocephalic and atraumatic.     Right Ear: External ear normal.     Left Ear: External ear normal.     Mouth/Throat:     Pharynx: No oropharyngeal exudate.  Eyes:     Conjunctiva/sclera: Conjunctivae normal.     Pupils: Pupils are equal, round, and reactive to light.  Cardiovascular:     Rate and Rhythm: Normal rate and regular rhythm.     Heart sounds: Normal heart sounds.  Pulmonary:     Effort: Pulmonary effort is normal.     Breath sounds: Normal breath sounds.  Abdominal:     General: Bowel sounds are normal.     Palpations: Abdomen is soft.  Musculoskeletal:        General: No tenderness.     Cervical back: Normal range of motion and neck supple.     Right lower leg: No edema.     Left lower leg: No edema.  Skin:    General: Skin is warm and dry.  Neurological:     Mental Status: He is alert. Mental status is at baseline.     Motor: Weakness present.     Gait: Gait abnormal.     Labs reviewed: Recent Labs    01/14/24 0000 02/08/24 0133 02/29/24 0000  NA 137 139 138  K 3.7 3.8 4.2  CL 103 101 100  CO2 29* 28 31*  GLUCOSE  --  88  --   BUN 23* 18 14  CREATININE 1.7* 1.31* 1.3  CALCIUM 8.6* 9.2 9.0    Recent Labs    02/08/24 0133  AST 21  ALT 13  ALKPHOS 120  BILITOT 0.8  PROT 7.3  ALBUMIN 4.1   Recent Labs    08/06/23 0000 01/14/24 0000 02/08/24 0133  WBC 6.8 7.8 6.9  NEUTROABS 4,202.00  --  4.3  HGB 10.5* 12.2* 13.2  HCT 33* 37* 40.6  MCV  --   --  82.2  PLT 209 189 199   Lab Results  Component Value Date   TSH 1.511 08/04/2022   No results found for: HGBA1C No results found for: CHOL, HDL, LDLCALC, LDLDIRECT, TRIG, CHOLHDL  Significant Diagnostic Results in last 30 days:  No results found.  Assessment/Plan Stage 3a chronic kidney disease (HCC) Chronic and stable Encourage proper hydration Follow metabolic panel Avoid nephrotoxic meds (NSAIDS)  Overactive bladder Controlled on gemtesa. Will continue current regimen.   Essential hypertension Bp stable at this time.  Will continue current regimen Monitor bmp  Encounter for medication titration 03/31/2024- tolerated dose reduction- will decrease zoloft  to 75 mg daily   Depression Stable on zoloft  and buspar.  Will attempt GDR of zoloft  and decrease to 75 mg daily   Dementia with behavioral disturbance (HCC) Stable, no acute changes in cognitive or functional status, continue supportive care. Behaviors are stable on current regimen. Continues on Abilify.    Congestive heart failure, unspecified HF chronicity, unspecified heart failure type (HCC) Euvolemic, continues on lasix .   Benign prostatic hyperplasia with urinary frequency Stable on flomax, no increase in urinary symptoms.   Atrial fibrillation, chronic (HCC) Rate controlled, continues on eliquis  for anticoagulation      Maryama Kuriakose K. Caro BODILY [facility] [phone]

## 2024-04-15 ENCOUNTER — Encounter: Payer: Self-pay | Admitting: Internal Medicine

## 2024-04-15 ENCOUNTER — Non-Acute Institutional Stay (SKILLED_NURSING_FACILITY): Payer: Self-pay | Admitting: Internal Medicine

## 2024-04-15 DIAGNOSIS — R112 Nausea with vomiting, unspecified: Secondary | ICD-10-CM | POA: Diagnosis not present

## 2024-04-15 NOTE — Assessment & Plan Note (Signed)
 Presumed viral illness.  There is an outbreak of influenza A in the facility but the patient is without any respiratory symptoms.  Nurses do not document any diarrhea.  Zofran  4 mg every 8 hours as needed has already been ordered.  Continue to monitor patient for symptomatic support.

## 2024-04-15 NOTE — Progress Notes (Signed)
 [facility] Acute Care Progress Note    Location:  [facility].  Nursing Home Room Number: Learta Beagle [id number] Place of Service:  SNF (31)   PCP: Laurence Locus, DO   Patient Care Team: Laurence Locus, DO as PCP - General (Internal Medicine)   Extended Emergency Contact Information Primary Emergency Contact: [name] Address: [address]          [place], [place] [address] Home Phone: [phone] Mobile Phone: [phone] Relation: Spouse Secondary Emergency Contact: Springborn,[name] Mobile Phone: [phone] Relation: Son   Goals of care: Advanced Directive information    03/31/2024    1:07 PM  Advanced Directives  Does Patient Have a Medical Advance Directive? Yes  Type of Advance Directive Out of facility DNR (pink MOST or yellow form)  Does patient want to make changes to medical advance directive? No - Patient declined     CODE STATUS: Do Not Resuscitate (DNR)   Chief Complaint  Patient presents with   Nausea and Vomiting     Nausea and Vomiting.    Medical Management of Chronic Issues     HPI: Pt is a 88 y.o. Blake seen today for an acute visit for Nausea and Vomiting.    Nursing reports the patient has had vomiting intermittently for the last 3 days.  No diarrhea.  Was treated with nausea.  Vomiting is stopped to date thus far.  He has eaten a regular breakfast and lunch today.  Logon is a 88 year old Blake with a history of primary hypertension, chronic A-fib, Alzheimer's type dementia with behavioral disturbance, CKD stage IIIa, BPH, chronic diastolic heart failure who has been a resident of [facility] since April 2024.  He is a long-term care resident now.  Past Medical History:  Diagnosis Date   Atrial fibrillation (HCC)    persistent   on NOAC   Hypertension    Vitamin B 12 deficiency    History reviewed. No pertinent surgical history.   Allergies[1]   Outpatient Encounter Medications as of 04/15/2024  Medication Sig    acetaminophen  (TYLENOL ) 500 MG tablet Take 1,000 mg by mouth every 8 (eight) hours as needed.   apixaban  (ELIQUIS ) 5 MG TABS tablet Take 5 mg by mouth 2 (two) times daily.   ARIPiprazole (ABILIFY) 5 MG tablet Take 5 mg by mouth daily.   busPIRone (BUSPAR) 5 MG tablet Take 5 mg by mouth 3 (three) times daily.   furosemide  (LASIX ) 40 MG tablet Take 40 mg by mouth daily.   ketoconazole (NIZORAL) 2 % shampoo Apply 1 Application topically daily. Wed and Sun   losartan (COZAAR) 25 MG tablet Take 25 mg by mouth daily.   memantine (NAMENDA) 10 MG tablet Take 10 mg by mouth 2 (two) times daily.   ondansetron  (ZOFRAN ) 4 MG tablet Take 4 mg by mouth every 8 (eight) hours as needed for nausea or vomiting.   polyethylene glycol (MIRALAX / GLYCOLAX) 17 g packet Take 17 g by mouth daily.   potassium chloride  (KLOR-CON ) 20 MEQ packet Take 40 mEq by mouth daily.   sertraline  (ZOLOFT ) 25 MG tablet Take 75 mg by mouth at bedtime.   tamsulosin (FLOMAX) 0.4 MG CAPS capsule Take 0.4 mg by mouth at bedtime.   Vibegron (GEMTESA) 75 MG TABS Take 1 tablet by mouth daily.   Zinc Oxide (TRIPLE PASTE) 12.8 % ointment Apply 1 Application topically daily. Every shift.   Multiple Vitamins-Minerals (MULTIVITAMIN WITH MINERALS) tablet Take 1 tablet by  mouth daily. (Patient not taking: Reported on 04/15/2024)   nystatin (MYCOSTATIN/NYSTOP) powder Apply 1 Application topically 4 (four) times daily. (Patient not taking: Reported on 04/15/2024)   No facility-administered encounter medications on file as of 04/15/2024.     Review of Systems  Unable to perform ROS: Dementia     Immunization History  Administered Date(s) Administered   Influenza Inj Mdck Quad Pf 01/14/2019   Influenza,inj,Quad PF,6+ Mos 03/21/2020   Influenza-Unspecified 01/18/2016, 01/16/2017, 01/29/2018, 02/18/2023, 02/09/2024   Moderna Covid-19 Fall Seasonal Vaccine 70yrs & older 08/07/2023   Moderna Sars-Covid-2 Vaccination 12/01/2019, 12/29/2019   Td  10/26/2017   Td (Adult),5 Lf Tetanus Toxid, Preservative Free 10/26/2017   Td (Adult),unspecified 05/01/2008   Tdap 05/01/2008   Unspecified SARS-COV-2 Vaccination 04/28/2023, 08/07/2023   Zoster, Live 04/28/2009   Pertinent  Health Maintenance Due  Topic Date Due   Influenza Vaccine  Completed      05/03/2022    8:00 PM 05/04/2022    8:35 AM 05/04/2022    8:00 PM 05/05/2022   11:00 AM 02/10/2024    4:37 PM  Fall Risk  Falls in the past year?     1  Was there an injury with Fall?     1   Fall Risk Category Calculator     3  (RETIRED) Patient Fall Risk Level High fall risk  High fall risk  High fall risk  High fall risk    Patient at Risk for Falls Due to     History of fall(s);Impaired balance/gait  Fall risk Follow up     Falls evaluation completed;Education provided     Data saved with a previous flowsheet row definition   Functional Status Survey:     Vitals:   04/15/24 1107  BP: (!) 183/82  Pulse: 66  Resp: 18  Temp: (!) 97.1 F (36.2 C)  SpO2: 96%  Weight: 212 lb 3.2 oz (96.3 kg)  Height: 6' (1.829 m)   Body mass index is 28.78 kg/m. Physical Exam Vitals and nursing note reviewed.  Constitutional:      Comments: Elderly white Blake.  Sitting in a wheelchair.  He was sitting in front of the TV but blankly staring at the screen.  HENT:     Head: Normocephalic and atraumatic.  Cardiovascular:     Rate and Rhythm: Normal rate. Rhythm irregular.  Pulmonary:     Effort: Pulmonary effort is normal.     Breath sounds: Normal breath sounds.  Abdominal:     General: Bowel sounds are normal. There is no distension.     Palpations: Abdomen is soft.  Musculoskeletal:     Right lower leg: No edema.     Left lower leg: No edema.  Skin:    Capillary Refill: Capillary refill takes less than 2 seconds.  Neurological:     Comments: Demented.  No distress.      Labs reviewed: Recent Labs    01/14/24 0000 02/08/24 0133 02/29/24 0000  NA 137 139 138  K 3.7 3.8 4.2   CL 103 101 100  CO2 29* 28 31*  GLUCOSE  --  Jim  --   BUN 23* 18 14  CREATININE 1.7* 1.31* 1.3  CALCIUM 8.6* 9.2 9.0   Recent Labs    02/08/24 0133  AST 21  ALT 13  ALKPHOS 120  BILITOT 0.8  PROT 7.3  ALBUMIN 4.1   Recent Labs    08/06/23 0000 01/14/24 0000 02/08/24 0133  WBC 6.8 7.8  6.9  NEUTROABS 4,202.00  --  4.3  HGB 10.5* 12.2* 13.2  HCT 33* 37* 40.6  MCV  --   --  82.2  PLT 209 189 199   Lab Results  Component Value Date   TSH 1.511 08/04/2022     Assessment and Plan: N&V (nausea and vomiting) Presumed viral illness.  There is an outbreak of influenza A in the facility but the patient is without any respiratory symptoms.  Nurses do not document any diarrhea.  Zofran  4 mg every 8 hours as needed has already been ordered.  Continue to monitor patient for symptomatic support.  Camellia Door, DO  [facility] [phone]      [1]  Allergies Allergen Reactions   Donepezil  Hcl Other (See Comments)    Reaction not specified.

## 2024-04-22 ENCOUNTER — Encounter: Payer: Self-pay | Admitting: Orthopedic Surgery

## 2024-04-22 ENCOUNTER — Non-Acute Institutional Stay (SKILLED_NURSING_FACILITY): Payer: Self-pay | Admitting: Orthopedic Surgery

## 2024-04-22 DIAGNOSIS — R001 Bradycardia, unspecified: Secondary | ICD-10-CM | POA: Diagnosis not present

## 2024-04-22 DIAGNOSIS — I509 Heart failure, unspecified: Secondary | ICD-10-CM | POA: Diagnosis not present

## 2024-04-22 DIAGNOSIS — I482 Chronic atrial fibrillation, unspecified: Secondary | ICD-10-CM | POA: Diagnosis not present

## 2024-04-22 DIAGNOSIS — F03918 Unspecified dementia, unspecified severity, with other behavioral disturbance: Secondary | ICD-10-CM

## 2024-04-22 NOTE — Progress Notes (Signed)
 " Location:  Other [facility].  Nursing Home Room Number: Learta Beagle [id number] Place of Service:  SNF [phone]) Provider:  Greig Cluster, NP  PCP: Laurence Locus, DO  Patient Care Team: Laurence Locus, DO as PCP - General (Internal Medicine)  Extended Emergency Contact Information Primary Emergency Contact: [name] Address: [address]          [place], [place] [address] Home Phone: [phone] Mobile Phone: [phone] Relation: Spouse Secondary Emergency Contact: Laforest,[name] Mobile Phone: [phone] Relation: Son  Code Status:  DNR Goals of care: Advanced Directive information    03/31/2024    1:07 PM  Advanced Directives  Does Patient Have a Medical Advance Directive? Yes  Type of Advance Directive Out of facility DNR (pink MOST or yellow form)  Does patient want to make changes to medical advance directive? No - Patient declined     Chief Complaint  Patient presents with   Low Heart Rate    Low Heart Rate.     HPI:  Pt is a 88 y.o. male seen today for acute visit due to bradycardia.   He currently resides on the skilled nursing unit at [facility]. PMH: dementia, HTN, PAF, CHF, CKD, BPH, OAB, depression and anxiety.    HR reported as 40 12/24 per nursing. Poor historian due to dementia. Apical pulse on exam 62, confirmed with pulse ox. Appears average heart rate 50-60's. He is not on a beta blocker or antiarrhythmic. Afebrile. Vitals stable.   12/19 noted to have nausea and vomiting. Resolved with zofran . No further episodes reported. He is eating > 50% of most meals.   No recent agitation. Remains on Abilify for dementia.   Recent GDR zoloft  and done well. Also on buspar.     Past Medical History:  Diagnosis Date   Atrial fibrillation (HCC)    persistent   on NOAC   Hypertension    Vitamin B 12 deficiency    History reviewed. No pertinent surgical history.  Allergies[1]  Outpatient Encounter Medications as of 04/22/2024  Medication Sig   acetaminophen   (TYLENOL ) 500 MG tablet Take 1,000 mg by mouth every 8 (eight) hours as needed.   apixaban  (ELIQUIS ) 5 MG TABS tablet Take 5 mg by mouth 2 (two) times daily.   ARIPiprazole (ABILIFY) 5 MG tablet Take 5 mg by mouth daily.   busPIRone (BUSPAR) 5 MG tablet Take 5 mg by mouth 3 (three) times daily.   furosemide  (LASIX ) 40 MG tablet Take 40 mg by mouth daily.   ketoconazole (NIZORAL) 2 % shampoo Apply 1 Application topically daily. Wed and Sun   losartan (COZAAR) 25 MG tablet Take 25 mg by mouth daily.   memantine (NAMENDA) 10 MG tablet Take 10 mg by mouth 2 (two) times daily.   ondansetron  (ZOFRAN ) 4 MG tablet Take 4 mg by mouth every 8 (eight) hours as needed for nausea or vomiting.   polyethylene glycol (MIRALAX / GLYCOLAX) 17 g packet Take 17 g by mouth daily.   potassium chloride  (KLOR-CON ) 20 MEQ packet Take 40 mEq by mouth daily.   sertraline  (ZOLOFT ) 25 MG tablet Take 75 mg by mouth at bedtime.   tamsulosin (FLOMAX) 0.4 MG CAPS capsule Take 0.4 mg by mouth at bedtime.   Vibegron (GEMTESA) 75 MG TABS Take 1 tablet by mouth daily.   Zinc Oxide (TRIPLE PASTE) 12.8 % ointment Apply 1 Application topically daily. Every shift.   Multiple Vitamins-Minerals (MULTIVITAMIN WITH MINERALS) tablet Take 1 tablet by mouth daily. (  Patient not taking: Reported on 04/22/2024)   nystatin (MYCOSTATIN/NYSTOP) powder Apply 1 Application topically 4 (four) times daily. (Patient not taking: Reported on 04/22/2024)   No facility-administered encounter medications on file as of 04/22/2024.    Review of Systems  Unable to perform ROS: Dementia    Immunization History  Administered Date(s) Administered   Influenza Inj Mdck Quad Pf 01/14/2019   Influenza,inj,Quad PF,6+ Mos 03/21/2020   Influenza-Unspecified 01/18/2016, 01/16/2017, 01/29/2018, 02/18/2023, 02/09/2024   Moderna Covid-19 Fall Seasonal Vaccine 46yrs & older 08/07/2023   Moderna Sars-Covid-2 Vaccination 12/01/2019, 12/29/2019   Td 10/26/2017   Td  (Adult),5 Lf Tetanus Toxid, Preservative Free 10/26/2017   Td (Adult),unspecified 05/01/2008   Tdap 05/01/2008   Unspecified SARS-COV-2 Vaccination 04/28/2023, 08/07/2023   Zoster, Live 04/28/2009   Pertinent  Health Maintenance Due  Topic Date Due   Influenza Vaccine  Completed      05/03/2022    8:00 PM 05/04/2022    8:35 AM 05/04/2022    8:00 PM 05/05/2022   11:00 AM 02/10/2024    4:37 PM  Fall Risk  Falls in the past year?     1  Was there an injury with Fall?     1   Fall Risk Category Calculator     3  (RETIRED) Patient Fall Risk Level High fall risk  High fall risk  High fall risk  High fall risk    Patient at Risk for Falls Due to     History of fall(s);Impaired balance/gait  Fall risk Follow up     Falls evaluation completed;Education provided     Data saved with a previous flowsheet row definition   Functional Status Survey:    Vitals:   04/22/24 0940  BP: 128/76  Pulse: (!) 52  Resp: (!) 22  Temp: (!) 97.5 F (36.4 C)  SpO2: 94%  Weight: 212 lb 3.2 oz (96.3 kg)  Height: 6' (1.829 m)   Body mass index is 28.78 kg/m. Physical Exam Vitals reviewed.  Constitutional:      General: He is not in acute distress. HENT:     Head: Normocephalic.  Eyes:     General:        Right eye: No discharge.        Left eye: No discharge.  Cardiovascular:     Rate and Rhythm: Normal rate. Rhythm irregular.     Pulses: Normal pulses.     Heart sounds: Normal heart sounds.     Comments: Apical pulse 61 Pulmonary:     Effort: Pulmonary effort is normal.     Breath sounds: Normal breath sounds.  Abdominal:     General: Bowel sounds are normal. There is no distension.     Palpations: Abdomen is soft.     Tenderness: There is no abdominal tenderness.  Musculoskeletal:     Cervical back: Neck supple.     Right lower leg: No edema.     Left lower leg: No edema.  Skin:    General: Skin is warm.     Capillary Refill: Capillary refill takes less than 2 seconds.  Neurological:      General: No focal deficit present.     Mental Status: He is alert. Mental status is at baseline.     Gait: Gait abnormal.  Psychiatric:        Mood and Affect: Mood normal.     Labs reviewed: Recent Labs    01/14/24 0000 02/08/24 0133 02/29/24 0000  NA 137  139 138  K 3.7 3.8 4.2  CL 103 101 100  CO2 29* 28 31*  GLUCOSE  --  88  --   BUN 23* 18 14  CREATININE 1.7* 1.31* 1.3  CALCIUM 8.6* 9.2 9.0   Recent Labs    02/08/24 0133  AST 21  ALT 13  ALKPHOS 120  BILITOT 0.8  PROT 7.3  ALBUMIN 4.1   Recent Labs    08/06/23 0000 01/14/24 0000 02/08/24 0133  WBC 6.8 7.8 6.9  NEUTROABS 4,202.00  --  4.3  HGB 10.5* 12.2* 13.2  HCT 33* 37* 40.6  MCV  --   --  82.2  PLT 209 189 199   Lab Results  Component Value Date   TSH 1.511 08/04/2022   No results found for: HGBA1C No results found for: CHOL, HDL, LDLCALC, LDLDIRECT, TRIG, CHOLHDL  Significant Diagnostic Results in last 30 days:  No results found.  Assessment/Plan 1. Bradycardia (Primary) - HR 40 reported 12/24 - apical pulse 61 today - average HR 50-60's - not on beta blocker or antiarrhythmic - HR BID x 5 days> put reading in provider notebook - if HR < 50, recommend EKG   2. Dementia with behavioral disturbance (HCC) - no agitation - recent BIMS score 0/15 (12/17) - dependent with ADLs except feeding - weight stable - cont Abilify - cont skilled nursing  3. Congestive heart failure, unspecified HF chronicity, unspecified heart failure type (HCC) - compensated - cont furosemide   4. Atrial fibrillation, chronic (HCC) - HR< 100 without medication - cont Eliquis  for clot prevention     Family/ staff Communication: plan discussed with patient and nurse  Labs/tests ordered:  none        [1]  Allergies Allergen Reactions   Donepezil  Hcl Other (See Comments)    Reaction not specified.   "

## 2024-04-29 ENCOUNTER — Encounter: Payer: Self-pay | Admitting: Orthopedic Surgery

## 2024-04-29 ENCOUNTER — Non-Acute Institutional Stay: Payer: Self-pay | Admitting: Orthopedic Surgery

## 2024-04-29 DIAGNOSIS — Z0000 Encounter for general adult medical examination without abnormal findings: Secondary | ICD-10-CM

## 2024-04-29 NOTE — Progress Notes (Signed)
 "  Chief Complaint  Patient presents with   Medicare Wellness    AWV     Subjective:   Jim Blake is a 89 y.o. male who presents for a Medicare Annual Wellness Visit.  Visit info / Clinical Intake: Medicare Wellness Visit Type:: Subsequent Annual Wellness Visit Persons participating in visit and providing information:: patient & caregiver Medicare Wellness Visit Mode:: In-person (required for WTM) Interpreter Needed?: No Pre-visit prep was completed: no AWV questionnaire completed by patient prior to visit?: no Living arrangements:: in nursing facility; in retirement community Patient's Overall Health Status Rating: good Typical amount of pain: some Does pain affect daily life?: no Are you currently prescribed opioids?: no  Dietary Habits and Nutritional Risks How many meals a day?: 3 Eats fruit and vegetables daily?: yes Most meals are obtained by: having others provide food In the last 2 weeks, have you had any of the following?: none Diabetic:: no  Functional Status Activities of Daily Living (to include ambulation/medication): (!) Dependent Feeding: Independent Dressing/Grooming: Dependent Bathing: Dependent Toileting: Dependent Transfer: Dependent Ambulation: Dependent Medication Administration: Dependent Is this a change from baseline?: Pre-admission baseline Home Management (perform basic housework or laundry): Dependent Manage your own finances?: (!) no Primary transportation is: facility / other Concerns about vision?: no *vision screening is required for WTM* Concerns about hearing?: no  Fall Screening Falls in the past year?: Exclusion - non ambulatory Number of falls in past year: 1 Was there an injury with Fall?: 1 Fall Risk Category Calculator: 3 Patient Fall Risk Level: High Fall Risk  Fall Risk Patient at Risk for Falls Due to: History of fall(s); Impaired balance/gait Fall risk Follow up: Falls evaluation completed  Home and Transportation  Safety: All rugs have non-skid backing?: N/A, no rugs All stairs or steps have railings?: N/A, no stairs Grab bars in the bathtub or shower?: yes Have non-skid surface in bathtub or shower?: yes Good home lighting?: yes Regular seat belt use?: yes Hospital stays in the last year:: (!) yes How many hospital stays:: 2 Reason: altered mental status  Cognitive Assessment Difficulty concentrating, remembering, or making decisions? : yes Will 6CIT or Mini Cog be Completed: no 6CIT or Mini Cog Declined: patient has a diagnosis of dementia or cognitive impairment  Advance Directives (For Healthcare) Does Patient Have a Medical Advance Directive?: Yes Does patient want to make changes to medical advance directive?: No - Patient declined Type of Advance Directive: Out of facility DNR (pink MOST or yellow form) Out of facility DNR (pink MOST or yellow form) in Chart? (Ambulatory ONLY): No - copy requested Would patient like information on creating a medical advance directive?: No - Patient declined  Reviewed/Updated  Reviewed/Updated: Reviewed All (Medical, Surgical, Family, Medications, Allergies, Care Teams, Patient Goals)    Allergies (verified) Donepezil  hcl   Current Medications (verified) Outpatient Encounter Medications as of 04/29/2024  Medication Sig   acetaminophen  (TYLENOL ) 500 MG tablet Take 1,000 mg by mouth every 8 (eight) hours as needed.   apixaban  (ELIQUIS ) 5 MG TABS tablet Take 5 mg by mouth 2 (two) times daily.   ARIPiprazole (ABILIFY) 5 MG tablet Take 5 mg by mouth daily.   busPIRone (BUSPAR) 5 MG tablet Take 5 mg by mouth 3 (three) times daily.   furosemide  (LASIX ) 40 MG tablet Take 40 mg by mouth daily.   ketoconazole (NIZORAL) 2 % shampoo Apply 1 Application topically daily. Wed and Sun   losartan (COZAAR) 25 MG tablet Take 25 mg by mouth daily.  memantine (NAMENDA) 10 MG tablet Take 10 mg by mouth 2 (two) times daily.   polyethylene glycol (MIRALAX / GLYCOLAX) 17  g packet Take 17 g by mouth daily.   potassium chloride  (KLOR-CON ) 20 MEQ packet Take 40 mEq by mouth daily.   sertraline  (ZOLOFT ) 25 MG tablet Take 75 mg by mouth at bedtime.   tamsulosin (FLOMAX) 0.4 MG CAPS capsule Take 0.4 mg by mouth at bedtime.   Vibegron (GEMTESA) 75 MG TABS Take 1 tablet by mouth daily.   Zinc Oxide (TRIPLE PASTE) 12.8 % ointment Apply 1 Application topically daily. Every shift.   ondansetron  (ZOFRAN ) 4 MG tablet Take 4 mg by mouth every 8 (eight) hours as needed for nausea or vomiting. (Patient not taking: Reported on 04/29/2024)   No facility-administered encounter medications on file as of 04/29/2024.    History: Past Medical History:  Diagnosis Date   Atrial fibrillation (HCC)    persistent   on NOAC   Hypertension    Vitamin B 12 deficiency    History reviewed. No pertinent surgical history. History reviewed. No pertinent family history. Social History   Occupational History   Not on file  Tobacco Use   Smoking status: Some Days    Types: Cigars   Smokeless tobacco: Never  Vaping Use   Vaping status: Never Used  Substance and Sexual Activity   Alcohol use: Never   Drug use: Never   Sexual activity: Not Currently   Tobacco Counseling Ready to quit: Not Answered Counseling given: Not Answered  SDOH Screenings   Food Insecurity: No Food Insecurity (08/04/2022)  Housing: Low Risk (08/04/2022)  Transportation Needs: No Transportation Needs (08/04/2022)  Utilities: Not At Risk (08/04/2022)  Financial Resource Strain: Patient Declined (04/16/2022)   Received from [facility]  Tobacco Use: High Risk (04/29/2024)   See flowsheets for full screening details  Depression Screen Depression Screening Exception Documentation Depression Screening Exception:: Medical reason (dementia)     Goals Addressed             This Visit's Progress    DIET - INCREASE WATER INTAKE   Not on track            Objective:    Today's Vitals    04/29/24 1220  BP: 133/75  Pulse: (!) 47  Resp: 17  Temp: (!) 97.4 F (36.3 C)  SpO2: 95%  Weight: 209 lb 12.8 oz (95.2 kg)  Height: 6' (1.829 m)   Body mass index is 28.45 kg/m.  Hearing/Vision screen No results found. Immunizations and Health Maintenance Health Maintenance  Topic Date Due   Zoster Vaccines- Shingrix (1 of 2) 06/21/1987   COVID-19 Vaccine (5 - 2025-26 season) 12/28/2023   Pneumococcal Vaccine: 50+ Years (1 of 2 - PCV) 07/08/2024 (Originally 06/20/1954)   Medicare Annual Wellness (AWV)  04/29/2025   DTaP/Tdap/Td (5 - Td or Tdap) 10/27/2027   Influenza Vaccine  Completed   Meningococcal B Vaccine  Aged Out        Assessment/Plan:  This is a routine wellness examination for Jim Blake.  Patient Care Team: Laurence Locus, DO as PCP - General (Internal Medicine)  I have personally reviewed and noted the following in the patients chart:   Medical and social history Use of alcohol, tobacco or illicit drugs  Current medications and supplements including opioid prescriptions. Functional ability and status Nutritional status Physical activity Advanced directives List of other physicians Hospitalizations, surgeries, and ER visits in previous 12 months Vitals Screenings to  include cognitive, depression, and falls Referrals and appointments  No orders of the defined types were placed in this encounter.  In addition, I have reviewed and discussed with patient certain preventive protocols, quality metrics, and best practice recommendations. A written personalized care plan for preventive services as well as general preventive health recommendations were provided to patient.   Greig FORBES Cluster, NP   04/29/2024   Return in 1 year (on 04/29/2025).  After Visit Summary: (In Person-Declined) Patient declined AVS at this time.  Nurse Notes: Patient lives in skilled nursing unit at [facility]. Questions answered with assistance of nurse. No recommendations at this time.  "

## 2024-05-11 ENCOUNTER — Non-Acute Institutional Stay (SKILLED_NURSING_FACILITY): Payer: Self-pay | Admitting: Orthopedic Surgery

## 2024-05-11 ENCOUNTER — Encounter: Payer: Self-pay | Admitting: Orthopedic Surgery

## 2024-05-11 DIAGNOSIS — I509 Heart failure, unspecified: Secondary | ICD-10-CM | POA: Diagnosis not present

## 2024-05-11 DIAGNOSIS — F3341 Major depressive disorder, recurrent, in partial remission: Secondary | ICD-10-CM

## 2024-05-11 DIAGNOSIS — F03918 Unspecified dementia, unspecified severity, with other behavioral disturbance: Secondary | ICD-10-CM | POA: Diagnosis not present

## 2024-05-11 DIAGNOSIS — I10 Essential (primary) hypertension: Secondary | ICD-10-CM

## 2024-05-11 DIAGNOSIS — N1831 Chronic kidney disease, stage 3a: Secondary | ICD-10-CM

## 2024-05-11 DIAGNOSIS — I482 Chronic atrial fibrillation, unspecified: Secondary | ICD-10-CM | POA: Diagnosis not present

## 2024-05-11 DIAGNOSIS — G4710 Hypersomnia, unspecified: Secondary | ICD-10-CM | POA: Diagnosis not present

## 2024-05-11 DIAGNOSIS — R350 Frequency of micturition: Secondary | ICD-10-CM | POA: Diagnosis not present

## 2024-05-11 DIAGNOSIS — N401 Enlarged prostate with lower urinary tract symptoms: Secondary | ICD-10-CM

## 2024-05-11 DIAGNOSIS — N3281 Overactive bladder: Secondary | ICD-10-CM

## 2024-05-11 DIAGNOSIS — H6123 Impacted cerumen, bilateral: Secondary | ICD-10-CM | POA: Diagnosis not present

## 2024-05-11 MED ORDER — DEBROX 6.5 % OT SOLN: 5.0000 [drp] | Freq: Two times a day (BID) | OTIC | Status: AC

## 2024-05-11 MED ORDER — ARIPIPRAZOLE 2 MG PO TABS: 2.0000 mg | ORAL_TABLET | Freq: Every day | ORAL | Status: AC

## 2024-05-11 NOTE — Progress Notes (Signed)
 " Location:  Other [facility]) Nursing Home Room Number: [id number] Place of Service:  SNF [phone]) Provider:  Gil Greig FORBES CARROLYN Laurence Camellia, DO  Patient Care Team: Laurence Camellia, DO as PCP - General (Internal Medicine)  Extended Emergency Contact Information Primary Emergency Contact: [name] Address: [address]          [place], [place] [address] Home Phone: [phone] Mobile Phone: [phone] Relation: Spouse Secondary Emergency Contact: Mazzoni,[name] Mobile Phone: [phone] Relation: Son  Code Status:  DNR Goals of care: Advanced Directive information    05/11/2024    9:25 AM  Advanced Directives  Does Patient Have a Medical Advance Directive? Yes  Type of Advance Directive Out of facility DNR (pink MOST or yellow form)  Does patient want to make changes to medical advance directive? No - Patient declined     Chief Complaint  Patient presents with   Medical Management of Chronic Issues    Routine Visit.    HPI:  Pt is a 89 y.o. male seen today for medical management of chronic diseases.    He currently resides on the skilled nursing unit at [facility]. PMH: dementia, HTN, PAF, CHF, CKD, BPH, OAB, depression and anxiety.   Hypersomnolence- increased sleeping x 2 weeks, observed him sleeping at breakfast table, easily aroused, he was then transferred from wheelchair to recliner with 2 aides Dementia- Recent BIMS score 0/15, no agitation, dependent with ADLs except feeding, 2+ transfer, remains on Abilify  and Namenda HTN- BUN/creat 14/1.3 02/29/2024, remains on losartan CHF- EF 55% 02/27/2023, see wrights below, remains on furosemide /KCL Atrial fib- TSH 1.511 08/04/2022, rate controlled without medication, remains on Eliquis  for clot prevention  CKD- GFR 54 (11/03)> was 40 (09/15) OAB- remains on Gemtesa Depression/anxiety- Zoloft  reduced 02/2024, stable mood with reduced dose, remains on buspar  BPH- remains on tamsulosin   Recent  weights:  01/07- 210.4 lbs  12/03- 207 lbs  11/05- 209.8 lbs  04/2023- 220.2 lbs  Recent blood pressures:  01/13- 132/80  01/12- 103/63  01/11- 137/74        Past Medical History:  Diagnosis Date   Atrial fibrillation (HCC)    persistent   on NOAC   Hypertension    Vitamin B 12 deficiency    History reviewed. No pertinent surgical history.  Allergies[1]  Outpatient Encounter Medications as of 05/11/2024  Medication Sig   acetaminophen  (TYLENOL ) 500 MG tablet Take 1,000 mg by mouth every 8 (eight) hours as needed.   apixaban  (ELIQUIS ) 5 MG TABS tablet Take 5 mg by mouth 2 (two) times daily.   ARIPiprazole  (ABILIFY ) 5 MG tablet Take 5 mg by mouth daily.   busPIRone (BUSPAR) 5 MG tablet Take 5 mg by mouth 3 (three) times daily.   furosemide  (LASIX ) 40 MG tablet Take 40 mg by mouth daily.   ketoconazole (NIZORAL) 2 % shampoo Apply 1 Application topically daily. Wed and Sun   losartan (COZAAR) 25 MG tablet Take 25 mg by mouth daily.   memantine (NAMENDA) 10 MG tablet Take 10 mg by mouth 2 (two) times daily.   polyethylene glycol (MIRALAX / GLYCOLAX) 17 g packet Take 17 g by mouth daily.   potassium chloride  (KLOR-CON ) 20 MEQ packet Take 40 mEq by mouth daily.   sertraline  (ZOLOFT ) 50 MG tablet Take 50 mg by mouth daily.   tamsulosin (FLOMAX) 0.4 MG CAPS capsule Take 0.4 mg by mouth at bedtime.   Vibegron (GEMTESA) 75 MG TABS Take 1  tablet by mouth daily.   Zinc Oxide (TRIPLE PASTE) 12.8 % ointment Apply 1 Application topically daily. Every shift.   ondansetron  (ZOFRAN ) 4 MG tablet Take 4 mg by mouth every 8 (eight) hours as needed for nausea or vomiting. (Patient not taking: Reported on 04/29/2024)   sertraline  (ZOLOFT ) 25 MG tablet Take 75 mg by mouth at bedtime. (Patient not taking: Reported on 05/11/2024)   No facility-administered encounter medications on file as of 05/11/2024.    Review of Systems  Unable to perform ROS: Dementia    Immunization History  Administered  Date(s) Administered   Influenza Inj Mdck Quad Pf 01/14/2019   Influenza,inj,Quad PF,6+ Mos 03/21/2020   Influenza-Unspecified 01/18/2016, 01/16/2017, 01/29/2018, 02/18/2023, 02/09/2024   Moderna Covid-19 Fall Seasonal Vaccine 109yrs & older 08/07/2023   Moderna Sars-Covid-2 Vaccination 12/01/2019, 12/29/2019   Td 10/26/2017   Td (Adult),5 Lf Tetanus Toxid, Preservative Free 10/26/2017   Td (Adult),unspecified 05/01/2008   Tdap 05/01/2008   Unspecified SARS-COV-2 Vaccination 04/28/2023, 08/07/2023   Zoster, Live 04/28/2009   Pertinent  Health Maintenance Due  Topic Date Due   Influenza Vaccine  Completed      05/04/2022    8:00 PM 05/05/2022   11:00 AM 02/10/2024    4:37 PM 04/22/2024   12:46 PM 04/29/2024    1:44 PM  Fall Risk  Falls in the past year?   1 Exclusion - non ambulatory Exclusion - non ambulatory  Was there an injury with Fall?   1     Fall Risk Category Calculator   3    (RETIRED) Patient Fall Risk Level High fall risk  High fall risk      Patient at Risk for Falls Due to   History of fall(s);Impaired balance/gait History of fall(s) History of fall(s);Impaired balance/gait  Fall risk Follow up   Falls evaluation completed;Education provided Falls evaluation completed Falls evaluation completed     Data saved with a previous flowsheet row definition   Functional Status Survey:    Vitals:   05/11/24 0924  BP: 124/82  Pulse: 63  Resp: 16  Temp: 98.5 F (36.9 C)  SpO2: 96%  Weight: 210 lb 6.4 oz (95.4 kg)  Height: 6' (1.829 m)   Body mass index is 28.54 kg/m. Physical Exam Vitals reviewed.  Constitutional:      General: He is not in acute distress. HENT:     Head: Normocephalic.     Right Ear: There is impacted cerumen.     Left Ear: There is impacted cerumen.     Nose: Nose normal.     Mouth/Throat:     Mouth: Mucous membranes are moist.  Eyes:     General:        Right eye: No discharge.        Left eye: No discharge.  Cardiovascular:     Rate  and Rhythm: Normal rate. Rhythm irregular.     Pulses: Normal pulses.     Heart sounds: Normal heart sounds.  Pulmonary:     Effort: Pulmonary effort is normal. No respiratory distress.     Breath sounds: Normal breath sounds. No wheezing or rales.  Abdominal:     General: Bowel sounds are normal. There is no distension.     Palpations: Abdomen is soft.     Tenderness: There is no abdominal tenderness.  Musculoskeletal:     Cervical back: Neck supple.     Right lower leg: No edema.     Left lower leg: No  edema.  Skin:    General: Skin is warm.     Capillary Refill: Capillary refill takes less than 2 seconds.  Neurological:     General: No focal deficit present.     Mental Status: He is alert. Mental status is at baseline.     Gait: Gait abnormal.  Psychiatric:        Mood and Affect: Mood normal.     Comments: Nonverbal, alert to self, does not follow commands     Labs reviewed: Recent Labs    01/14/24 0000 02/08/24 0133 02/29/24 0000  NA 137 139 138  K 3.7 3.8 4.2  CL 103 101 100  CO2 29* 28 31*  GLUCOSE  --  88  --   BUN 23* 18 14  CREATININE 1.7* 1.31* 1.3  CALCIUM 8.6* 9.2 9.0   Recent Labs    02/08/24 0133  AST 21  ALT 13  ALKPHOS 120  BILITOT 0.8  PROT 7.3  ALBUMIN 4.1   Recent Labs    08/06/23 0000 01/14/24 0000 02/08/24 0133  WBC 6.8 7.8 6.9  NEUTROABS 4,202.00  --  4.3  HGB 10.5* 12.2* 13.2  HCT 33* 37* 40.6  MCV  --   --  82.2  PLT 209 189 199   Lab Results  Component Value Date   TSH 1.511 08/04/2022   No results found for: HGBA1C No results found for: CHOL, HDL, LDLCALC, LDLDIRECT, TRIG, CHOLHDL  Significant Diagnostic Results in last 30 days:  No results found.  Assessment/Plan 1. Bilateral impacted cerumen (Primary) - carbamide peroxide (DEBROX) 6.5 % OTIC solution; Place 5 drops into both ears 2 (two) times daily for 5 days. - flush ears when drops complete  2. Hypersomnolence - per nursing sleeping more  during day - observed sleeping through breakfast - exam unremarkable - no weight loss - will reduce Abilify  to 2 mg daily   3. Dementia with behavioral disturbance (HCC) - see above - dependent with ADLs - weight stable> decreased 10 lbs within past year - 2+ assist transfer - cont Namenda - ARIPiprazole  (ABILIFY ) 2 MG tablet; Take 1 tablet (2 mg total) by mouth daily.  4. Essential hypertension - controlled with losartan  5. Congestive heart failure, unspecified HF chronicity, unspecified heart failure type (HCC) - EF 55% 02/2023 - compensated  - cont furosemide /KCL  6. Atrial fibrillation, chronic (HCC) - HR< 100 without medication  - cont Eliquis  for clot prevention   7. Stage 3a chronic kidney disease (HCC) - encourage hydration with water - avoid NSAIDS  8. Overactive bladder - cont Gemtesa  9. Recurrent major depressive disorder, in partial remission - stable mood with reduced Zoloft   - cont Buspar  10. Benign prostatic hyperplasia with urinary frequency - cont tamsulosin     Family/ staff Communication: plan discussed with nurse  Labs/tests ordered:  none         [1]  Allergies Allergen Reactions   Donepezil  Hcl Other (See Comments)    Reaction not specified.   "

## 2024-05-20 ENCOUNTER — Non-Acute Institutional Stay (SKILLED_NURSING_FACILITY): Admitting: Orthopedic Surgery

## 2024-05-20 ENCOUNTER — Encounter: Payer: Self-pay | Admitting: Orthopedic Surgery

## 2024-05-20 DIAGNOSIS — R509 Fever, unspecified: Secondary | ICD-10-CM | POA: Diagnosis not present

## 2024-05-20 DIAGNOSIS — F03918 Unspecified dementia, unspecified severity, with other behavioral disturbance: Secondary | ICD-10-CM | POA: Diagnosis not present

## 2024-05-20 LAB — BASIC METABOLIC PANEL WITH GFR
BUN: 24 — AB (ref 4–21)
CO2: 28 — AB (ref 13–22)
Chloride: 107 (ref 99–108)
Creatinine: 1.3 (ref 0.6–1.3)
Glucose: 99
Potassium: 3.9 meq/L (ref 3.5–5.1)
Sodium: 140 (ref 137–147)

## 2024-05-20 LAB — CBC AND DIFFERENTIAL
HCT: 38 — AB (ref 41–53)
Hemoglobin: 12.3 — AB (ref 13.5–17.5)
Neutrophils Absolute: 2408
Platelets: 174 10*3/uL (ref 150–400)
WBC: 4.3

## 2024-05-20 LAB — CBC: RBC: 4.46 (ref 3.87–5.11)

## 2024-05-20 LAB — COMPREHENSIVE METABOLIC PANEL WITH GFR
Calcium: 8.7 (ref 8.7–10.7)
eGFR: 54

## 2024-05-20 NOTE — Progress Notes (Signed)
 " Location:  Other [facility].  Nursing Home Room Number: [facility] [id number] Place of Service:  SNF [phone]) Provider:  Greig Cluster, NP  PCP: Laurence Locus, DO  Patient Care Team: Laurence Locus, DO as PCP - General (Internal Medicine)  Extended Emergency Contact Information Primary Emergency Contact: [name] Address: [address]          [place], [place] [address] Home Phone: [phone] Mobile Phone: [phone] Relation: Spouse Secondary Emergency Contact: Cheatwood,[name] Mobile Phone: [phone] Relation: Son  Code Status:  DNR Goals of care: Advanced Directive information    05/11/2024    9:25 AM  Advanced Directives  Does Patient Have a Medical Advance Directive? Yes  Type of Advance Directive Out of facility DNR (pink MOST or yellow form)  Does patient want to make changes to medical advance directive? No - Patient declined     Chief Complaint  Patient presents with   Fever    Fever    HPI:  Pt is a 89 y.o. male seen today for acute visit due to fever.   He currently resides on the skilled nursing unit at [facility]. PMH: dementia, HTN, PAF, CHF, CKD, BPH, OAB, depression and anxiety.   Poor historian due to dementia. 01/23 temp 99.1. He is lethargic and only opens eyes to chest rub. He has not taken po medication this morning or eaten breakfast. Rapid covid/flu test negative.      Past Medical History:  Diagnosis Date   Atrial fibrillation (HCC)    persistent   on NOAC   Hypertension    Vitamin B 12 deficiency    History reviewed. No pertinent surgical history.  Allergies[1]  Outpatient Encounter Medications as of 05/20/2024  Medication Sig   acetaminophen  (TYLENOL ) 500 MG tablet Take 1,000 mg by mouth every 8 (eight) hours as needed.   apixaban  (ELIQUIS ) 5 MG TABS tablet Take 5 mg by mouth 2 (two) times daily.   ARIPiprazole  (ABILIFY ) 2 MG tablet Take 1 tablet (2 mg total) by mouth daily.   busPIRone  (BUSPAR ) 5 MG tablet Take 5 mg by mouth 3  (three) times daily.   furosemide  (LASIX ) 40 MG tablet Take 40 mg by mouth daily.   ketoconazole (NIZORAL) 2 % shampoo Apply 1 Application topically daily. Wed and Sun   losartan  (COZAAR ) 25 MG tablet Take 25 mg by mouth daily.   memantine  (NAMENDA ) 10 MG tablet Take 10 mg by mouth 2 (two) times daily.   polyethylene glycol (MIRALAX  / GLYCOLAX ) 17 g packet Take 17 g by mouth daily.   potassium chloride  (KLOR-CON ) 20 MEQ packet Take 40 mEq by mouth daily.   sertraline  (ZOLOFT ) 50 MG tablet Take 75 mg by mouth daily.   tamsulosin  (FLOMAX ) 0.4 MG CAPS capsule Take 0.4 mg by mouth at bedtime.   Vibegron (GEMTESA) 75 MG TABS Take 1 tablet by mouth daily.   Zinc Oxide (TRIPLE PASTE) 12.8 % ointment Apply 1 Application topically daily. Every shift.   No facility-administered encounter medications on file as of 05/20/2024.    Review of Systems  Immunization History  Administered Date(s) Administered   Influenza Inj Mdck Quad Pf 01/14/2019   Influenza,inj,Quad PF,6+ Mos 03/21/2020   Influenza-Unspecified 01/18/2016, 01/16/2017, 01/29/2018, 02/18/2023, 02/09/2024   Moderna Covid-19 Fall Seasonal Vaccine 49yrs & older 08/07/2023   Moderna Sars-Covid-2 Vaccination 12/01/2019, 12/29/2019   Td 10/26/2017   Td (Adult),5 Lf Tetanus Toxid, Preservative Free 10/26/2017   Td (Adult),unspecified 05/01/2008   Tdap 05/01/2008  Unspecified SARS-COV-2 Vaccination 04/28/2023, 08/07/2023   Zoster, Live 04/28/2009   Pertinent  Health Maintenance Due  Topic Date Due   Influenza Vaccine  Completed      05/05/2022   11:00 AM 02/10/2024    4:37 PM 04/22/2024   12:46 PM 04/29/2024    1:44 PM 05/11/2024   12:37 PM  Fall Risk  Falls in the past year?  1 Exclusion - non ambulatory Exclusion - non ambulatory Exclusion - non ambulatory  Was there an injury with Fall?  1      Fall Risk Category Calculator  3     (RETIRED) Patient Fall Risk Level High fall risk       Patient at Risk for Falls Due to  History of  fall(s);Impaired balance/gait History of fall(s) History of fall(s);Impaired balance/gait   Fall risk Follow up  Falls evaluation completed;Education provided Falls evaluation completed Falls evaluation completed      Data saved with a previous flowsheet row definition   Functional Status Survey:    Vitals:   05/20/24 1145  BP: (!) 154/73  Pulse: (!) 55  Resp: 16  Temp: 97.7 F (36.5 C)  SpO2: 97%  Weight: 210 lb 9.6 oz (95.5 kg)  Height: 6' (1.829 m)   Body mass index is 28.56 kg/m. Physical Exam Constitutional:      Appearance: He is normal weight.  HENT:     Right Ear: Tympanic membrane normal.     Left Ear: Tympanic membrane normal.     Nose: Nose normal. No rhinorrhea.     Mouth/Throat:     Mouth: Mucous membranes are moist.  Eyes:     General:        Right eye: No discharge.        Left eye: No discharge.  Cardiovascular:     Rate and Rhythm: Normal rate and regular rhythm.     Pulses: Normal pulses.     Heart sounds: Normal heart sounds.  Pulmonary:     Effort: Pulmonary effort is normal. No respiratory distress.     Breath sounds: Examination of the right-middle field reveals decreased breath sounds. Examination of the left-middle field reveals decreased breath sounds. Examination of the right-lower field reveals decreased breath sounds. Examination of the left-lower field reveals decreased breath sounds. Decreased breath sounds present. No wheezing, rhonchi or rales.  Abdominal:     General: Bowel sounds are normal. There is no distension.     Palpations: Abdomen is soft.     Tenderness: There is no abdominal tenderness.  Musculoskeletal:     Cervical back: Neck supple.     Right lower leg: No edema.     Left lower leg: No edema.  Lymphadenopathy:     Cervical: No cervical adenopathy.  Skin:    General: Skin is warm.     Capillary Refill: Capillary refill takes less than 2 seconds.  Neurological:     Mental Status: He is lethargic.     Motor: Weakness  present.     Gait: Gait abnormal.  Psychiatric:     Comments: Nonverbal, does not follow commands     Labs reviewed: Recent Labs    01/14/24 0000 02/08/24 0133 02/29/24 0000  NA 137 139 138  K 3.7 3.8 4.2  CL 103 101 100  CO2 29* 28 31*  GLUCOSE  --  88  --   BUN 23* 18 14  CREATININE 1.7* 1.31* 1.3  CALCIUM 8.6* 9.2 9.0   Recent Labs  02/08/24 0133  AST 21  ALT 13  ALKPHOS 120  BILITOT 0.8  PROT 7.3  ALBUMIN 4.1   Recent Labs    08/06/23 0000 01/14/24 0000 02/08/24 0133  WBC 6.8 7.8 6.9  NEUTROABS 4,202.00  --  4.3  HGB 10.5* 12.2* 13.2  HCT 33* 37* 40.6  MCV  --   --  82.2  PLT 209 189 199   Lab Results  Component Value Date   TSH 1.511 08/04/2022   No results found for: HGBA1C No results found for: CHOL, HDL, LDLCALC, LDLDIRECT, TRIG, CHOLHDL  Significant Diagnostic Results in last 30 days:  No results found.  Assessment/Plan 1. Fever, unspecified fever cause (Primary) - temp 99.1, lethargic - covid/flu negative - lung sounds diminished  - concerns for PNA> Rocephin  1 g IM once - CXR r/o PNA> negative for infiltrates - cbc/diff, bmp> WBC 4.3, BUN/creat 24/1.29, Na+ 140> cancel UA/culture   2. Dementia with behavioral disturbance (HCC) -  dependent with ADLs - Abilify  reduced to 2 mg 05/11/2024> no behaviors - weight stable> decreased 10 lbs within past year - 2+ assist transfer - cont Namenda     Family/ staff Communication: plan discussed with nurse  Labs/tests ordered:  CXR, cbc/diff, bmp, UA/culture         [1]  Allergies Allergen Reactions   Donepezil  Hcl Other (See Comments)    Reaction not specified.   "

## 2024-05-21 ENCOUNTER — Emergency Department
Admission: EM | Admit: 2024-05-21 | Discharge: 2024-05-23 | Disposition: A | Discharge status: Skilled Nursing Facility | Attending: Emergency Medicine | Admitting: Emergency Medicine

## 2024-05-21 DIAGNOSIS — R339 Retention of urine, unspecified: Secondary | ICD-10-CM | POA: Diagnosis not present

## 2024-05-21 DIAGNOSIS — Z7901 Long term (current) use of anticoagulants: Secondary | ICD-10-CM | POA: Insufficient documentation

## 2024-05-21 DIAGNOSIS — F0390 Unspecified dementia without behavioral disturbance: Secondary | ICD-10-CM | POA: Insufficient documentation

## 2024-05-21 DIAGNOSIS — I10 Essential (primary) hypertension: Secondary | ICD-10-CM | POA: Diagnosis not present

## 2024-05-21 DIAGNOSIS — N481 Balanitis: Secondary | ICD-10-CM | POA: Insufficient documentation

## 2024-05-21 DIAGNOSIS — I4891 Unspecified atrial fibrillation: Secondary | ICD-10-CM | POA: Diagnosis not present

## 2024-05-21 DIAGNOSIS — Z79899 Other long term (current) drug therapy: Secondary | ICD-10-CM | POA: Diagnosis not present

## 2024-05-21 DIAGNOSIS — N4889 Other specified disorders of penis: Secondary | ICD-10-CM | POA: Diagnosis present

## 2024-05-21 NOTE — ED Triage Notes (Addendum)
 Patient presents to the ED via EMS from [facility] for complaints of penile pain and swelling after the facility placed a urethral catheter on 05/20/24. Redness and swelling noted without discharge. Catheter no longer in place on arrival to ED. Patient pleasantly confused on arrival with history of Dementia.

## 2024-05-21 NOTE — ED Notes (Signed)
Fall bundle in place. Bed alarm activated.

## 2024-05-22 LAB — URINALYSIS, W/ REFLEX TO CULTURE (INFECTION SUSPECTED)
Bilirubin Urine: NEGATIVE
Glucose, UA: NEGATIVE mg/dL
Hgb urine dipstick: NEGATIVE
Ketones, ur: NEGATIVE mg/dL
Leukocytes,Ua: NEGATIVE
Nitrite: NEGATIVE
Protein, ur: NEGATIVE mg/dL
Specific Gravity, Urine: 1.021 (ref 1.005–1.030)
Squamous Epithelial / HPF: 0 /HPF (ref 0–5)
pH: 5 (ref 5.0–8.0)

## 2024-05-22 LAB — COMPREHENSIVE METABOLIC PANEL WITH GFR
ALT: 14 U/L (ref 0–44)
AST: 21 U/L (ref 15–41)
Albumin: 3.8 g/dL (ref 3.5–5.0)
Alkaline Phosphatase: 122 U/L (ref 38–126)
Anion gap: 10 (ref 5–15)
BUN: 20 mg/dL (ref 8–23)
CO2: 24 mmol/L (ref 22–32)
Calcium: 8.9 mg/dL (ref 8.9–10.3)
Chloride: 107 mmol/L (ref 98–111)
Creatinine, Ser: 1.13 mg/dL (ref 0.61–1.24)
GFR, Estimated: >60 mL/min
Glucose, Bld: 100 mg/dL — ABNORMAL HIGH (ref 70–99)
Potassium: 3.9 mmol/L (ref 3.5–5.1)
Sodium: 141 mmol/L (ref 135–145)
Total Bilirubin: 0.6 mg/dL (ref 0.0–1.2)
Total Protein: 6.7 g/dL (ref 6.5–8.1)

## 2024-05-22 LAB — CBC WITH DIFFERENTIAL/PLATELET
Abs Immature Granulocytes: 0.02 10*3/uL (ref 0.00–0.07)
Basophils Absolute: 0 10*3/uL (ref 0.0–0.1)
Basophils Relative: 0 %
Eosinophils Absolute: 0.2 10*3/uL (ref 0.0–0.5)
Eosinophils Relative: 3 %
HCT: 39.2 % (ref 39.0–52.0)
Hemoglobin: 12.6 g/dL — ABNORMAL LOW (ref 13.0–17.0)
Immature Granulocytes: 0 %
Lymphocytes Relative: 33 %
Lymphs Abs: 1.9 10*3/uL (ref 0.7–4.0)
MCH: 27.2 pg (ref 26.0–34.0)
MCHC: 32.1 g/dL (ref 30.0–36.0)
MCV: 84.5 fL (ref 80.0–100.0)
Monocytes Absolute: 0.5 10*3/uL (ref 0.1–1.0)
Monocytes Relative: 8 %
Neutro Abs: 3.2 10*3/uL (ref 1.7–7.7)
Neutrophils Relative %: 56 %
Platelets: 180 10*3/uL (ref 150–400)
RBC: 4.64 MIL/uL (ref 4.22–5.81)
RDW: 14.6 % (ref 11.5–15.5)
WBC: 5.8 10*3/uL (ref 4.0–10.5)
nRBC: 0 % (ref 0.0–0.2)

## 2024-05-22 MED ORDER — ONDANSETRON HCL 4 MG PO TABS: 4.0000 mg | ORAL_TABLET | Freq: Three times a day (TID) | ORAL | Status: DC | PRN

## 2024-05-22 MED ORDER — LORAZEPAM 2 MG/ML IJ SOLN
1.0000 mg | Freq: Once | INTRAMUSCULAR | Status: AC
  Administered 2024-05-22: 1 mg via INTRAMUSCULAR
  Filled 2024-05-22: qty 1

## 2024-05-22 MED ORDER — MEMANTINE HCL 5 MG PO TABS
10.0000 mg | ORAL_TABLET | Freq: Two times a day (BID) | ORAL | Status: DC
  Administered 2024-05-22 – 2024-05-23 (×4): 10 mg via ORAL
  Filled 2024-05-22 (×4): qty 2

## 2024-05-22 MED ORDER — SERTRALINE HCL 50 MG PO TABS
50.0000 mg | ORAL_TABLET | Freq: Every day | ORAL | Status: DC
  Administered 2024-05-22: 50 mg via ORAL
  Filled 2024-05-22: qty 1

## 2024-05-22 MED ORDER — BUSPIRONE HCL 5 MG PO TABS
5.0000 mg | ORAL_TABLET | Freq: Three times a day (TID) | ORAL | Status: DC
  Administered 2024-05-22 – 2024-05-23 (×4): 5 mg via ORAL
  Filled 2024-05-22 (×4): qty 1

## 2024-05-22 MED ORDER — APIXABAN 5 MG PO TABS
5.0000 mg | ORAL_TABLET | Freq: Two times a day (BID) | ORAL | Status: DC
  Administered 2024-05-22 – 2024-05-23 (×3): 5 mg via ORAL
  Filled 2024-05-22 (×3): qty 1

## 2024-05-22 MED ORDER — CLOTRIMAZOLE 1 % EX CREA
1.0000 | TOPICAL_CREAM | Freq: Two times a day (BID) | CUTANEOUS | Status: DC
  Administered 2024-05-22 – 2024-05-23 (×3): 1 via TOPICAL
  Filled 2024-05-22: qty 15

## 2024-05-22 MED ORDER — MIRABEGRON ER 25 MG PO TB24
25.0000 mg | ORAL_TABLET | Freq: Every day | ORAL | Status: DC
  Administered 2024-05-22 – 2024-05-23 (×2): 25 mg via ORAL
  Filled 2024-05-22 (×2): qty 1

## 2024-05-22 MED ORDER — POLYETHYLENE GLYCOL 3350 17 G PO PACK
17.0000 g | PACK | Freq: Every day | ORAL | Status: DC
  Administered 2024-05-23: 17 g via ORAL
  Filled 2024-05-22 (×2): qty 1

## 2024-05-22 MED ORDER — LOSARTAN POTASSIUM 50 MG PO TABS
25.0000 mg | ORAL_TABLET | Freq: Every day | ORAL | Status: DC
  Administered 2024-05-22 – 2024-05-23 (×2): 25 mg via ORAL
  Filled 2024-05-22 (×2): qty 1

## 2024-05-22 MED ORDER — FUROSEMIDE 40 MG PO TABS
40.0000 mg | ORAL_TABLET | Freq: Every day | ORAL | Status: DC
  Administered 2024-05-22 – 2024-05-23 (×2): 40 mg via ORAL
  Filled 2024-05-22 (×2): qty 1

## 2024-05-22 MED ORDER — POTASSIUM CHLORIDE 20 MEQ PO PACK
40.0000 meq | PACK | Freq: Every day | ORAL | Status: DC
  Administered 2024-05-22 – 2024-05-23 (×2): 40 meq via ORAL
  Filled 2024-05-22 (×2): qty 2

## 2024-05-22 MED ORDER — ACETAMINOPHEN 500 MG PO TABS: 1000.0000 mg | ORAL_TABLET | Freq: Four times a day (QID) | ORAL | Status: DC | PRN

## 2024-05-22 MED ORDER — ACETAMINOPHEN 500 MG PO TABS
1000.0000 mg | ORAL_TABLET | Freq: Once | ORAL | Status: AC
  Administered 2024-05-22: 1000 mg via ORAL
  Filled 2024-05-22: qty 2

## 2024-05-22 MED ORDER — CLOTRIMAZOLE 1 % EX CREA: 1.0000 | TOPICAL_CREAM | Freq: Two times a day (BID) | CUTANEOUS | 0 refills | Status: AC

## 2024-05-22 MED ORDER — FLUCONAZOLE 50 MG PO TABS
150.0000 mg | ORAL_TABLET | Freq: Once | ORAL | Status: AC
  Administered 2024-05-22: 150 mg via ORAL
  Filled 2024-05-22: qty 1

## 2024-05-22 MED ORDER — TAMSULOSIN HCL 0.4 MG PO CAPS
0.4000 mg | ORAL_CAPSULE | Freq: Every day | ORAL | Status: DC
  Administered 2024-05-22: 0.4 mg via ORAL
  Filled 2024-05-22: qty 1

## 2024-05-22 MED ORDER — CLOTRIMAZOLE 1 % EX CREA
1.0000 | TOPICAL_CREAM | Freq: Once | CUTANEOUS | Status: AC
  Administered 2024-05-22: 1 via TOPICAL
  Filled 2024-05-22: qty 15

## 2024-05-22 MED ORDER — ARIPIPRAZOLE 2 MG PO TABS
2.0000 mg | ORAL_TABLET | Freq: Every day | ORAL | Status: DC
  Administered 2024-05-22 – 2024-05-23 (×2): 2 mg via ORAL
  Filled 2024-05-22 (×2): qty 1

## 2024-05-22 NOTE — ED Notes (Signed)
Pt is sleeping soundly in NAD

## 2024-05-22 NOTE — ED Notes (Signed)
 Pt noted to be pulling on foley catheter tubing upon assessment.  IV catheter in bed and blood on shirt.  Bleeding from IV site controlled, pressure bandage placed. Safety mitts applied at this time.  Pt restless and fidgeting in bed.

## 2024-05-22 NOTE — ED Notes (Signed)
 Used pill crusher to administer Tylenol  with applesauce.

## 2024-05-22 NOTE — TOC Initial Note (Addendum)
 Transition of Care [facility]) - Initial/Assessment Note    Patient Details  Name: Jim Blake MRN: [id number] Date of Birth: 05/25/1935  [facility] of [facility] CM/SW Contact:    Caliph Borowiak L Tekela Garguilo, LCSW Phone Number: 05/22/2024, 10:06 AM  Clinical Narrative:                  [facility] consult for SNF placement. Patient is currently boarding in the ED. Patient is from [facility] and does not need placement. Patient is unable to transport back to facility as [facility] is not transporting patients unless it is an emergency.   FL2 will be completed and provided to [facility] for review.     10:45am: CSW spoke with spouse, Jim Blake. CSW advised of recommendations. Patient came to the ED from [facility] SNF/LTC. Patient is a LTC resident.   Shara will be initiated.     Patient Goals and CMS Choice            Expected Discharge Plan and Services                                              Prior Living Arrangements/Services                       Activities of Daily Living      Permission Sought/Granted                  Emotional Assessment              Admission diagnosis:  Penile Pain Patient Active Problem List   Diagnosis Date Noted   N&V (nausea and vomiting) 04/15/2024   Encounter for medication titration 03/03/2024   Poor dentition 01/19/2024   Overactive bladder 07/09/2023   Congestive heart failure, unspecified HF chronicity, unspecified heart failure type (HCC) 05/21/2023   Malnutrition of moderate degree 08/06/2022   Dementia with behavioral disturbance (HCC) 04/29/2022   Depression 04/29/2022   Stage 3a chronic kidney disease (HCC) 12/05/2020   Essential hypertension 10/10/2020   Anxiety 10/10/2020   B12 deficiency 10/10/2020   Atrial fibrillation, chronic (HCC) 10/10/2020   Multiple falls 10/10/2020   Benign prostatic hyperplasia with urinary frequency 08/12/2019   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   PCP:  Laurence Locus, DO Pharmacy:    [facility] [id number], [place] - [address] [place] [address] Phone: [phone] Fax: [phone]     Social Drivers of Health (SDOH) Social History: SDOH Screenings   Food Insecurity: No Food Insecurity (08/04/2022)  Housing: Low Risk (08/04/2022)  Transportation Needs: No Transportation Needs (08/04/2022)  Utilities: Not At Risk (08/04/2022)  Financial Resource Strain: Patient Declined (04/16/2022)   Received from [facility]  Tobacco Use: High Risk (05/20/2024)   SDOH Interventions:     Readmission Risk Interventions     No data to display

## 2024-05-22 NOTE — [place] FL2 (Signed)
 " [place]  MEDICAID FL2 LEVEL OF CARE FORM     IDENTIFICATION  Patient Name: Jim Blake Birthdate: [birth year] Sex: male Admission Date (Current Location): 05/21/2024  [facility] and [place] Number:  Chiropodist and Address:  [facility], [address], [place], [place] [address]      Provider Number: [id number]  Attending Physician Name and Address:  Nicholaus Rolland BRAVO, MD  Relative Name and Phone Number:  PATRICE, MOATES, Emergency Contact  [phone] (Mobile)    Current Level of Care: Hospital Recommended Level of Care: Skilled Nursing Facility Prior Approval Number:    Date Approved/Denied:   PASRR Number: [id number] ([id number])  Discharge Plan: SNF    Current Diagnoses: Patient Active Problem List   Diagnosis Date Noted   N&V (nausea and vomiting) 04/15/2024   Encounter for medication titration 03/03/2024   Poor dentition 01/19/2024   Overactive bladder 07/09/2023   Congestive heart failure, unspecified HF chronicity, unspecified heart failure type (HCC) 05/21/2023   Malnutrition of moderate degree 08/06/2022   Dementia with behavioral disturbance (HCC) 04/29/2022   Depression 04/29/2022   Stage 3a chronic kidney disease (HCC) 12/05/2020   Essential hypertension 10/10/2020   Anxiety 10/10/2020   B12 deficiency 10/10/2020   Atrial fibrillation, chronic (HCC) 10/10/2020   Multiple falls 10/10/2020   Benign prostatic hyperplasia with urinary frequency 08/12/2019   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    Orientation RESPIRATION BLADDER Height & Weight        Normal Indwelling catheter Weight: 219 lb 5.7 oz (99.5 kg) Height:     BEHAVIORAL SYMPTOMS/MOOD NEUROLOGICAL BOWEL NUTRITION STATUS     (Dementia) Continent Diet  AMBULATORY STATUS COMMUNICATION OF NEEDS Skin   Supervision Verbally Normal                       Personal Care Assistance Level of Assistance  Bathing, Dressing Bathing Assistance: Limited assistance    Dressing Assistance: Limited assistance     Functional Limitations Info             SPECIAL CARE FACTORS FREQUENCY                       Contractures Contractures Info: Not present    Additional Factors Info  Allergies   Allergies Info: Donepezil  HCL           Current Medications (05/22/2024):  This is the current hospital active medication list Current Facility-Administered Medications  Medication Dose Route Frequency Provider Last Rate Last Admin   acetaminophen  (TYLENOL ) tablet 1,000 mg  1,000 mg Oral Q6H PRN Ward, Kristen N, DO       apixaban  (ELIQUIS ) tablet 5 mg  5 mg Oral BID Ward, Kristen N, DO       ARIPiprazole  (ABILIFY ) tablet 2 mg  2 mg Oral Daily Ward, Kristen N, DO       busPIRone  (BUSPAR ) tablet 5 mg  5 mg Oral TID Ward, Kristen N, DO       clotrimazole  (LOTRIMIN ) 1 % cream 1 Application  1 Application Topical BID Ward, Kristen N, DO       furosemide  (LASIX ) tablet 40 mg  40 mg Oral Daily Ward, Kristen N, DO       losartan  (COZAAR ) tablet 25 mg  25 mg Oral Daily Ward, Kristen N, DO       memantine  (NAMENDA ) tablet 10 mg  10 mg Oral BID Ward, Kristen N, DO   10 mg  at 05/22/24 0319   mirabegron  ER (MYRBETRIQ ) tablet 25 mg  25 mg Oral Daily Ward, Kristen N, DO       ondansetron  (ZOFRAN ) tablet 4 mg  4 mg Oral Q8H PRN Ward, Kristen N, DO       polyethylene glycol (MIRALAX  / GLYCOLAX ) packet 17 g  17 g Oral Daily Ward, Kristen N, DO       potassium chloride  (KLOR-CON ) packet 40 mEq  40 mEq Oral Daily Ward, Kristen N, DO       sertraline  (ZOLOFT ) tablet 50 mg  50 mg Oral QHS Ward, Kristen N, DO       tamsulosin  (FLOMAX ) capsule 0.4 mg  0.4 mg Oral QHS Ward, Kristen N, DO       Current Outpatient Medications  Medication Sig Dispense Refill   acetaminophen  (TYLENOL ) 500 MG tablet Take 1,000 mg by mouth every 8 (eight) hours as needed.     apixaban  (ELIQUIS ) 5 MG TABS tablet Take 5 mg by mouth 2 (two) times daily.     ARIPiprazole  (ABILIFY ) 2 MG tablet Take  1 tablet (2 mg total) by mouth daily.     busPIRone  (BUSPAR ) 5 MG tablet Take 5 mg by mouth 3 (three) times daily.     clotrimazole  (LOTRIMIN ) 1 % cream Apply 1 Application topically 2 (two) times daily. Apply to penis twice daily x 1 week 30 g 0   furosemide  (LASIX ) 40 MG tablet Take 40 mg by mouth daily.     losartan  (COZAAR ) 25 MG tablet Take 25 mg by mouth daily.     memantine  (NAMENDA ) 10 MG tablet Take 10 mg by mouth 2 (two) times daily.     polyethylene glycol (MIRALAX  / GLYCOLAX ) 17 g packet Take 17 g by mouth every other day.     potassium chloride  (KLOR-CON ) 20 MEQ packet Take 40 mEq by mouth daily.     sertraline  (ZOLOFT ) 50 MG tablet Take 75 mg by mouth daily.     tamsulosin  (FLOMAX ) 0.4 MG CAPS capsule Take 0.4 mg by mouth at bedtime.     Vibegron (GEMTESA) 75 MG TABS Take 1 tablet by mouth daily.       Discharge Medications: Please see discharge summary for a list of discharge medications.  Relevant Imaging Results:  Relevant Lab Results:   Additional Information [id number]  Oluwaferanmi Wain L Aydan Levitz, LCSW     "

## 2024-05-22 NOTE — ED Notes (Signed)
 [place] to fax [facility]

## 2024-05-22 NOTE — ED Notes (Signed)
Med requested from main pharmacy

## 2024-05-22 NOTE — Evaluation (Signed)
 Physical Therapy Evaluation Patient Details Name: Jim Blake MRN: [id number] DOB: 04/05/1936 Today's Date: 05/22/2024  History of Present Illness  presented to ER from LTC secondary to penile pain and urinary retention after pulling catheter.  Clinical Impression  Patient sleeping on ED stretcher upon arrival to bedspace. Awakens to voice and light touch, requiring consistent stimulation, position change to optimize alertness.  Patient oriented to self only; generally unable to participate with further orientation interview re: location, date and reason for admission.  Limited ability to follow verbal commands, generally requiring hand-over-hand assist to initiate and complete all on-commands activities.  Safety mittens donned, but removed/reapplied as appropriate for participation with session. Bilat UE/LE strength and ROM grossly WFL for basic transfers and mobility (as noted with spontaneous movement); limited ability to participate with formal MMT.  No isolated, focal weakness appreciated. Currently requiring max assist +1-2 for bed mobility; min/mod assist for unsupported sitting balance; max assist +1-2 for sit/stand and standing balance with bilat HHA.  Requires hand-over-hand assist to position LEs, initiate lift off and maintain standing balance; posterior weight shift/trunk lean, requiring constant assist to prevent LOB. Unable to initiate/complete LE stepping despite facilitation from therapist (limited by poor understanding of task, poor standing balance). Appears to be grossly at functional baseline as reported, confirmed with facility staff.  No acute functional change appreciated; no acute PT needs identified.  Will complete initial orders at this time; please reconsult should needs change.  Anticipate patient appropriate for return to LTC with familiar setting, caregiver and routine.      If plan is discharge home, recommend the following: Two people to help with walking and/or  transfers;Two people to help with bathing/dressing/bathroom   Can travel by private vehicle   No    Equipment Recommendations    Recommendations for Other Services       Functional Status Assessment Patient has not had a recent decline in their functional status     Precautions / Restrictions Precautions Precautions: Fall Precaution/Restrictions Comments: bilat safety mittens Restrictions Weight Bearing Restrictions Per Provider Order: No      Mobility  Bed Mobility Overal bed mobility: Needs Assistance Bed Mobility: Supine to Sit, Sit to Supine     Supine to sit: Max assist, +2 for physical assistance Sit to supine: Max assist, +2 for physical assistance   General bed mobility comments: Unable to maintain sitting balance without constant hand-on assist to prevent posterior LOB; minimal/no spontaneous righting reactions despite truncal strength necessary to activate    Transfers Overall transfer level: Needs assistance Equipment used: 2 person hand held assist Transfers: Sit to/from Stand Sit to Stand: Max assist, +2 physical assistance           General transfer comment: hand-over-hand assist to position LEs, initiate lift off and maintain standing balance; posterior weight shift/trunk lean, requiring constant assist to prevent LOB. Unable to initiate/complete LE stepping despite facilitation from therapist.    Ambulation/Gait               General Gait Details: unsafe/unable; non-ambulatory at baseline  Stairs            Wheelchair Mobility     Tilt Bed    Modified Rankin (Stroke Patients Only)       Balance Overall balance assessment: Needs assistance Sitting-balance support: No upper extremity supported, Feet supported Sitting balance-Leahy Scale: Poor     Standing balance support: Bilateral upper extremity supported Standing balance-Leahy Scale: Zero  Pertinent Vitals/Pain Pain  Assessment Pain Assessment: Faces Faces Pain Scale: No hurt    Home Living Family/patient expects to be discharged to::  (LTC at [facility])                        Prior Function Prior Level of Function : Needs assist             Mobility Comments: Patient poor historian; unable to provide. Per staff at facility, patient +1-2 for SPT between seating surfaces; uses manual WC as primary mobility, sometimes pushing himsefl in chair..  Feeds self; generally confused, but pleasant with care.       Extremity/Trunk Assessment   Upper Extremity Assessment Upper Extremity Assessment: Overall WFL for tasks assessed (grossly at least 4/5)    Lower Extremity Assessment Lower Extremity Assessment: Overall WFL for tasks assessed (grossly at least 4/5)       Communication        Cognition Arousal: Alert Behavior During Therapy: Flat affect   PT - Cognitive impairments: No family/caregiver present to determine baseline                       PT - Cognition Comments: Responds to name; otherwise, unable to meaningfully participate with interview.  Pleasant and cooperative, but often requires hand-over-hand assist to initiate and complete all functional activities.  Minimal/no attempts at verbalizations/communication during session. Following commands: Impaired Following commands impaired: Follows one step commands inconsistently, Follows one step commands with increased time     Cueing Cueing Techniques: Verbal cues, Gestural cues, Tactile cues     General Comments      Exercises     Assessment/Plan    PT Assessment Patient does not need any further PT services  PT Problem List         PT Treatment Interventions      PT Goals (Current goals can be found in the Care Plan section)  Acute Rehab PT Goals PT Goal Formulation: All assessment and education complete, DC therapy Time For Goal Achievement: 05/22/24    Frequency       Co-evaluation                AM-PAC PT 6 Clicks Mobility  Outcome Measure Help needed turning from your back to your side while in a flat bed without using bedrails?: A Lot Help needed moving from lying on your back to sitting on the side of a flat bed without using bedrails?: A Lot Help needed moving to and from a bed to a chair (including a wheelchair)?: A Lot Help needed standing up from a chair using your arms (e.g., wheelchair or bedside chair)?: A Lot Help needed to walk in hospital room?: Total Help needed climbing 3-5 steps with a railing? : Total 6 Click Score: 10    End of Session     Patient left: in bed;with call bell/phone within reach;with bed alarm set Nurse Communication: Mobility status PT Visit Diagnosis: Muscle weakness (generalized) (M62.81)    Time: 8753-8695 PT Time Calculation (min) (ACUTE ONLY): 18 min   Charges:   PT Evaluation $PT Eval Moderate Complexity: 1 Mod   PT General Charges $$ ACUTE PT VISIT: 1 Visit        Wandell Scullion H. Delores, PT, DPT, NCS 05/22/24, 2:14 PM [phone]

## 2024-05-22 NOTE — ED Provider Notes (Addendum)
 "  [facility] Provider Note    Event Date/Time   First MD Initiated Contact with Patient 05/21/24 2351     (approximate)   History   Groin Swelling   HPI  Jim Blake is a 89 y.o. male with history of dementia, atrial fibrillation on Eliquis , hypertension who is coming from his nursing facility with complaints of penile pain.  Patient is pleasantly demented here and denies any pain.  He is not able to provide any history.  Recently had a Foley catheter removed per EMS.  Of note patient was recently seen by nursing home staff for lethargy and temperature of 99.1.  COVID, flu and RSV negative.  Chest x-ray showed no pneumonia.  Was given a dose of IM Rocephin .  History provided by EMS, level 5 caveat secondary to dementia.    Past Medical History:  Diagnosis Date   Atrial fibrillation (HCC)    persistent   on NOAC   Hypertension    Vitamin B 12 deficiency     No past surgical history on file.  MEDICATIONS:  Prior to Admission medications  Medication Sig Start Date End Date Taking? Authorizing Provider  acetaminophen  (TYLENOL ) 500 MG tablet Take 1,000 mg by mouth every 8 (eight) hours as needed.    [provider]  apixaban  (ELIQUIS ) 5 MG TABS tablet Take 5 mg by mouth 2 (two) times daily.    [provider]  ARIPiprazole  (ABILIFY ) 2 MG tablet Take 1 tablet (2 mg total) by mouth daily. 05/11/24   Fargo, Amy E, NP  busPIRone  (BUSPAR ) 5 MG tablet Take 5 mg by mouth 3 (three) times daily.    [provider]  furosemide  (LASIX ) 40 MG tablet Take 40 mg by mouth daily.    [provider]  ketoconazole (NIZORAL) 2 % shampoo Apply 1 Application topically daily. Wed and Sun 04/27/23   [provider]  losartan  (COZAAR ) 25 MG tablet Take 25 mg by mouth daily.    [provider]  memantine  (NAMENDA ) 10 MG tablet Take 10 mg by mouth 2 (two) times daily.    [provider]  polyethylene glycol (MIRALAX  /  GLYCOLAX ) 17 g packet Take 17 g by mouth daily.    [provider]  potassium chloride  (KLOR-CON ) 20 MEQ packet Take 40 mEq by mouth daily.    [provider]  sertraline  (ZOLOFT ) 50 MG tablet Take 75 mg by mouth daily.    [provider]  tamsulosin  (FLOMAX ) 0.4 MG CAPS capsule Take 0.4 mg by mouth at bedtime.    [provider]  Vibegron (GEMTESA) 75 MG TABS Take 1 tablet by mouth daily.    [provider]  Zinc Oxide (TRIPLE PASTE) 12.8 % ointment Apply 1 Application topically daily. Every shift.    [provider]    Physical Exam   Triage Vital Signs: ED Triage Vitals [05/21/24 2355]  Encounter Vitals Group     BP (!) 161/97     Girls Systolic BP Percentile      Girls Diastolic BP Percentile      Boys Systolic BP Percentile      Boys Diastolic BP Percentile      Pulse Rate 73     Resp 16     Temp 99.2 F (37.3 C)     Temp Source Oral     SpO2 98 %     Weight 219 lb 5.7 oz (99.5 kg)     Height  Head Circumference      Peak Flow      Pain Score      Pain Loc      Pain Education      Exclude from Growth Chart     Most recent vital signs: Vitals:   05/22/24 0049 05/22/24 0110  BP: (!) 182/83 (!) 156/99  Pulse: 69 63  Resp:    Temp:    SpO2: 96% 98%    CONSTITUTIONAL: Alert, elderly, pleasant, in no distress, confused, nontoxic HEAD: Normocephalic, atraumatic EYES: Conjunctivae clear, pupils appear equal, sclera nonicteric ENT: normal nose; moist mucous membranes NECK: Supple, normal ROM CARD: RRR; S1 and S2 appreciated RESP: Normal chest excursion without splinting or tachypnea; breath sounds clear and equal bilaterally; no wheezes, no rhonchi, no rales, no hypoxia or respiratory distress, speaking full sentences ABD/GI: Distended in the lower abdomen without tenderness, guarding or rebound GU: Patient has mild swelling noted to the glans and foreskin with erythema, scattered maculopapular satellite  lesions.  No phimosis or paraphimosis.  Patient is circumcised.  Minimal irritation at the urethral meatus but no discharge or bleeding.  No testicular pain, swelling, masses.  No scrotal abnormality.  Testicles nontender.  No hernia appreciated.  Chaperone present [name], RN). NEURO: Moves all extremities equally, normal speech PSYCH: The patient's mood and manner are appropriate.   ED Results / Procedures / Treatments   LABS: (all labs ordered are listed, but only abnormal results are displayed) Labs Reviewed  URINALYSIS, W/ REFLEX TO CULTURE (INFECTION SUSPECTED) - Abnormal; Notable for the following components:      Result Value   Color, Urine YELLOW (*)    APPearance CLEAR (*)    Bacteria, UA RARE (*)    All other components within normal limits  CBC WITH DIFFERENTIAL/PLATELET - Abnormal; Notable for the following components:   Hemoglobin 12.6 (*)    All other components within normal limits  COMPREHENSIVE METABOLIC PANEL WITH GFR - Abnormal; Notable for the following components:   Glucose, Bld 100 (*)    All other components within normal limits     EKG:  EKG Interpretation Date/Time:    Ventricular Rate:    PR Interval:    QRS Duration:    QT Interval:    QTC Calculation:   R Axis:      Text Interpretation:           RADIOLOGY: My personal review and interpretation of imaging:    I have personally reviewed all radiology reports.   No results found.   PROCEDURES:  Critical Care performed: No     Procedures    IMPRESSION / MDM / ASSESSMENT AND PLAN / ED COURSE  I reviewed the triage vital signs and the nursing notes.    Patient here with complaints of penile discomfort at his nursing facility.  He denies any pain here and appears quite comfortable.  Has balanitis on exam.     DIFFERENTIAL DIAGNOSIS (includes but not limited to):   Balanitis likely due to Candida, no signs of paraphimosis or phimosis, no signs of Fournier's gangrene or  necrotizing fasciitis, no signs of cellulitis or abscess.  Doubt testicular torsion, epididymitis, orchitis.  Bladder scan does show 328 mL in his bladder.  He is slightly distended here.  History of urinary retention with recent Foley catheter removal.  Likely secondary to BPH.  Differential also includes UTI.   Patient's presentation is most consistent with acute presentation with potential threat to life or bodily  function.   PLAN: Patient here with recurrent urinary retention.  Will place Foley catheter and send urinalysis and culture.  He has balanitis on exam secondary to Candida.  Will start him on Diflucan  and apply clotrimazole .  Otherwise well-appearing, nontoxic, afebrile here.  No testicular tenderness, scrotal masses or other sign of infection.   MEDICATIONS GIVEN IN ED: Medications  acetaminophen  (TYLENOL ) tablet 1,000 mg (1,000 mg Oral Given 05/22/24 0006)  fluconazole  (DIFLUCAN ) tablet 150 mg (150 mg Oral Given 05/22/24 0040)  clotrimazole  (LOTRIMIN ) 1 % cream 1 Application (1 Application Topical Given 05/22/24 0041)     ED COURSE: Urinalysis shows no sign of infection.  Patient has been hemodynamically stable.  No leukocytosis.  Normal hemoglobin.  Normal creatinine and electrolytes.  I feel he is safe to be discharged back to his nursing facility with antifungal cream for his balanitis and with Foley catheter in place with urology follow-up.   1:33 AM  Due to inclement weather, there is no available transport to take patient back to his nursing facility.  He will be kept in the ED in boarder status.  His home medications will be reordered, diet ordered.  Will continue to treat his balanitis here in the ED. Foley catheter will be in place here.  2:14 AM  Pt coming from [facility].  Will reach out to his nursing home to see if they are able to provide transport.  Nursing staff also to reach out to family members to see if they can pick him up from the ED.  3:12 AM  Pt  becoming increasingly agitated per nursing staff and pulled out his peripheral IV.  Will leave IV out and give IM Ativan  for sedation.  Mitts are in place as he is pulling on his Foley catheter.  Nursing staff has reached out to [facility] and they state that he did not have a Foley catheter but they were performing regular in and outs on him.  Will leave Foley catheter for now given urinary retention and he can follow-up with urology as an outpatient for this.  Unfortunately given patient's dementia and that he is bedbound at baseline, security at [facility] and family are not able to transport patient home at this time.  Due to including weather, [facility] is also not available to transport this patient nor is [facility].  Anticipate that he will be discharged back to his nursing facility when road conditions have improved in several days.    CONSULTS:  none   OUTSIDE RECORDS REVIEWED: Reviewed multiple recent nursing home notes.       FINAL CLINICAL IMPRESSION(S) / ED DIAGNOSES   Final diagnoses:  Balanitis  Urinary retention     Rx / DC Orders   ED Discharge Orders     None        Note:  This document was prepared using Dragon voice recognition software and may include unintentional dictation errors.   Kedron Uno, Josette SAILOR, DO 05/22/24 0131    Cheyrl Buley, Josette SAILOR, DO 05/22/24 0134    Gracey Tolle, Josette SAILOR, DO 05/22/24 0214    Leya Paige, Josette SAILOR, DO 05/22/24 9682  "

## 2024-05-22 NOTE — ED Notes (Signed)
 Spoke with [name], RN at [facility]. Per Jim Blake, patient can not be transported back to facility via security guard due to being bedbound at baseline. Patient will be transported via [facility] when weather is permissible and roads are safe.

## 2024-05-22 NOTE — ED Notes (Signed)
 Bed alarm on and grip socks on

## 2024-05-22 NOTE — ED Notes (Signed)
 328 ml shown via bladder scanner.

## 2024-05-23 NOTE — ED Provider Notes (Signed)
----------------------------------------- °  4:08 AM on 05/23/2024 -----------------------------------------   Blood pressure (!) 169/63, pulse 69, temperature 98.7 F (37.1 C), temperature source Oral, resp. rate 19, weight 99.5 kg, SpO2 99%.  The patient is calm and cooperative at this time.  There have been no acute events since the last update.  Awaiting disposition plan from case management/social work.   Patient is awaiting transportation back to [facility].  He has dementia and is bedbound.  Unfortunately due to inclement weather and poor road conditions, [facility] is not currently running.   Arnesha Schiraldi, Josette SAILOR, DO 05/23/24 [phone]

## 2024-05-23 NOTE — ED Notes (Signed)
 Patient was assisted with breakfast this morning. Patient ate a few bites of pancake and about 3/4 of the cream of wheat. Patient drank all the cranberry juice.

## 2024-05-23 NOTE — ED Notes (Signed)
Assisted pt with lunch tray.  

## 2024-05-23 NOTE — Progress Notes (Signed)
 OT Cancellation Note  Patient Details Name: Kayo Zion MRN: [id number] DOB: Sep 02, 1935   Cancelled Treatment:    Reason Eval/Treat Not Completed: OT screened, no needs identified, will sign off. Per PT, pt is likely at his functional baseline and recommend return to LTC facility upon DC. OT to sign off in house and can be consulted again if new needs arise.  Shalena Ezzell E Chrismon 05/23/2024, 8:27 AM

## 2024-05-24 ENCOUNTER — Encounter: Payer: Self-pay | Admitting: Internal Medicine

## 2024-05-24 ENCOUNTER — Non-Acute Institutional Stay (SKILLED_NURSING_FACILITY): Admitting: Internal Medicine

## 2024-05-24 DIAGNOSIS — I482 Chronic atrial fibrillation, unspecified: Secondary | ICD-10-CM | POA: Diagnosis not present

## 2024-05-24 DIAGNOSIS — N401 Enlarged prostate with lower urinary tract symptoms: Secondary | ICD-10-CM

## 2024-05-24 DIAGNOSIS — R338 Other retention of urine: Secondary | ICD-10-CM | POA: Insufficient documentation

## 2024-05-24 DIAGNOSIS — F419 Anxiety disorder, unspecified: Secondary | ICD-10-CM | POA: Diagnosis not present

## 2024-05-24 DIAGNOSIS — Z5181 Encounter for therapeutic drug level monitoring: Secondary | ICD-10-CM | POA: Diagnosis not present

## 2024-05-24 DIAGNOSIS — F339 Major depressive disorder, recurrent, unspecified: Secondary | ICD-10-CM | POA: Diagnosis not present

## 2024-05-24 DIAGNOSIS — I10 Essential (primary) hypertension: Secondary | ICD-10-CM | POA: Diagnosis not present

## 2024-05-24 DIAGNOSIS — I509 Heart failure, unspecified: Secondary | ICD-10-CM | POA: Diagnosis not present

## 2024-05-24 DIAGNOSIS — F03B18 Unspecified dementia, moderate, with other behavioral disturbance: Secondary | ICD-10-CM | POA: Diagnosis not present

## 2024-05-24 DIAGNOSIS — N3281 Overactive bladder: Secondary | ICD-10-CM | POA: Diagnosis not present

## 2024-05-24 DIAGNOSIS — Z66 Do not resuscitate: Secondary | ICD-10-CM | POA: Insufficient documentation

## 2024-05-24 DIAGNOSIS — N1831 Chronic kidney disease, stage 3a: Secondary | ICD-10-CM

## 2024-05-24 DIAGNOSIS — N138 Other obstructive and reflux uropathy: Secondary | ICD-10-CM

## 2024-05-24 NOTE — Assessment & Plan Note (Signed)
Pt is a DNR DNI

## 2024-05-24 NOTE — Assessment & Plan Note (Signed)
 BUN 20 and Scr 1.13 on 05-22-2024.

## 2024-05-24 NOTE — Assessment & Plan Note (Signed)
 Consider reduction of Zoloft  to 50 mg daily in 2nd qtr 2026.  03/03/2024(4th qtr 2025)- decrease zoloft  from 125 mg daily to 100 mg daily  03/31/2024(4th qtr 2025)- tolerated dose reduction- currently on 100 mg daily will decrease zoloft  to 75 mg daily

## 2024-05-24 NOTE — Assessment & Plan Note (Signed)
 No recent outbursts. Remains on Abilify  2 mg daily, zoloft  75 mg daily, buspar  5 mg tid

## 2024-05-24 NOTE — Assessment & Plan Note (Signed)
 Continue flomax  0.4 mg daily. F/u with urology for his urinary retention.

## 2024-05-24 NOTE — Assessment & Plan Note (Signed)
 Now has foley catheter. Will need urology followup. Hold Gemtasa for now. Can restart if he is able to get foley catheter out.
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
## 2024-05-24 NOTE — Assessment & Plan Note (Signed)
 Remains on zoloft  75 mg daily, buspar  5 mg tid.

## 2024-05-24 NOTE — Assessment & Plan Note (Signed)
 On Eliquis  5 mg bid. Not on any rate controlling meds.

## 2024-05-24 NOTE — Assessment & Plan Note (Signed)
 On lasix  40 mg daily, losartan  25 mg daily

## 2024-05-24 NOTE — Assessment & Plan Note (Signed)
 Hold Gemtasa due to acute urinary retention. He will need voiding trial at urology office and foley catheter to be removed before Gemtasa can be restarted.

## 2024-05-24 NOTE — Assessment & Plan Note (Signed)
 Remains on zoloft  75 mg daily, abilify  2 mg daily, buspar  5 mg tid.

## 2024-05-24 NOTE — Assessment & Plan Note (Signed)
 Remains on lasix  40 mg daily.

## 2024-05-26 ENCOUNTER — Non-Acute Institutional Stay (SKILLED_NURSING_FACILITY): Payer: Self-pay | Admitting: Nurse Practitioner

## 2024-05-26 ENCOUNTER — Encounter: Payer: Self-pay | Admitting: Nurse Practitioner

## 2024-05-26 DIAGNOSIS — R310 Gross hematuria: Secondary | ICD-10-CM | POA: Diagnosis not present

## 2024-05-26 NOTE — Progress Notes (Signed)
 " Location:  Other [facility].  Nursing Home Room Number: [facility] [id number] Place of Service:  SNF [phone]) Harlene An, NP  PCP: Laurence Locus, DO  Patient Care Team: Laurence Locus, DO as PCP - General (Internal Medicine)  Extended Emergency Contact Information Primary Emergency Contact: [name] Address: [address]          [place], [place] [address] Home Phone: [phone] Mobile Phone: [phone] Relation: Spouse Secondary Emergency Contact: Moulder,[name] Mobile Phone: [phone] Relation: Son  Goals of care: Advanced Directive information    05/24/2024   10:49 AM  Advanced Directives  Does Patient Have a Medical Advance Directive? No  Does patient want to make changes to medical advance directive? No - Patient declined     Chief Complaint  Patient presents with   Hematuria    Blood In Urine.     HPI:  Pt is a 89 y.o. male seen today for an acute visit for Blood in Urine.  Pt is a long term resident at [facility].  Recently he had norovirus and had perfuse nausea, vomiting and diarrhea.  He was very lethargic after 2 days of illness and question if there was anything else going on with this. He was transported to the ED and diagnosised with balanitis and a catheter was placed and left in- on 05/21/24 He has not had a hx of BPH or urinary retention in the past- his gemtesa was stopped.  Staff notes today he has blood in his tubing today He is eating and drinking at baseline.  No fevers or chills.    Past Medical History:  Diagnosis Date   Atrial fibrillation (HCC)    persistent   on NOAC   Hypertension    Vitamin B 12 deficiency    History reviewed. No pertinent surgical history.  Allergies[1]  Outpatient Encounter Medications as of 05/26/2024  Medication Sig   acetaminophen  (TYLENOL ) 500 MG tablet Take 1,000 mg by mouth every 8 (eight) hours as needed.   apixaban  (ELIQUIS ) 5 MG TABS tablet Take 5 mg by mouth 2 (two) times daily.   ARIPiprazole   (ABILIFY ) 2 MG tablet Take 1 tablet (2 mg total) by mouth daily.   busPIRone  (BUSPAR ) 5 MG tablet Take 5 mg by mouth 3 (three) times daily.   clotrimazole  (LOTRIMIN ) 1 % cream Apply 1 Application topically 2 (two) times daily. Apply to penis twice daily x 1 week   furosemide  (LASIX ) 40 MG tablet Take 40 mg by mouth daily.   KETOCONAZOLE, TOPICAL, 1 % SHAM Apply 1 % topically. Apply to SCALP topically every day shift every Wed, Sun for SKIN IRRITATION LATHER AND RINSE   losartan  (COZAAR ) 25 MG tablet Take 25 mg by mouth daily.   memantine  (NAMENDA ) 10 MG tablet Take 10 mg by mouth 2 (two) times daily.   polyethylene glycol (MIRALAX  / GLYCOLAX ) 17 g packet Take 17 g by mouth every other day.   potassium chloride  (KLOR-CON ) 20 MEQ packet Take 40 mEq by mouth daily.   sertraline  (ZOLOFT ) 50 MG tablet Take 75 mg by mouth daily.   tamsulosin  (FLOMAX ) 0.4 MG CAPS capsule Take 0.4 mg by mouth at bedtime.   ZINC OXIDE, TOPICAL, EX Apply topically daily. Apply to buttocks topically every shift for skin protectant   Vibegron (GEMTESA) 75 MG TABS Take 1 tablet by mouth daily. (Patient not taking: Reported on 05/26/2024)   No facility-administered encounter medications on file as of 05/26/2024.    Review of Systems  Unable to perform ROS: Dementia    Immunization History  Administered Date(s) Administered   Influenza Inj Mdck Quad Pf 01/14/2019   Influenza,inj,Quad PF,6+ Mos 03/21/2020   Influenza-Unspecified 01/18/2016, 01/16/2017, 01/29/2018, 02/18/2023, 02/09/2024   Moderna Covid-19 Fall Seasonal Vaccine 34yrs & older 08/07/2023   Moderna Sars-Covid-2 Vaccination 12/01/2019, 12/29/2019   Td 10/26/2017   Td (Adult),5 Lf Tetanus Toxid, Preservative Free 10/26/2017   Td (Adult),unspecified 05/01/2008   Tdap 05/01/2008   Unspecified SARS-COV-2 Vaccination 04/28/2023, 08/07/2023   Zoster, Live 04/28/2009   Pertinent  Health Maintenance Due  Topic Date Due   Influenza Vaccine  Completed       05/05/2022   11:00 AM 02/10/2024    4:37 PM 04/22/2024   12:46 PM 04/29/2024    1:44 PM 05/11/2024   12:37 PM  Fall Risk  Falls in the past year?  1 Exclusion - non ambulatory Exclusion - non ambulatory Exclusion - non ambulatory  Was there an injury with Fall?  1      Fall Risk Category Calculator  3     (RETIRED) Patient Fall Risk Level High fall risk       Patient at Risk for Falls Due to  History of fall(s);Impaired balance/gait History of fall(s) History of fall(s);Impaired balance/gait   Fall risk Follow up  Falls evaluation completed;Education provided Falls evaluation completed Falls evaluation completed      Data saved with a previous flowsheet row definition   Functional Status Survey:    Vitals:   05/26/24 1227  BP: (!) 141/74  Pulse: 74  Resp: 18  Temp: (!) 95.1 F (35.1 C)  SpO2: 96%  Weight: 200 lb (90.7 kg)  Height: 6' (1.829 m)   Body mass index is 27.12 kg/m. Physical Exam Constitutional:      General: He is not in acute distress.    Appearance: He is well-developed. He is not diaphoretic.  HENT:     Head: Normocephalic and atraumatic.     Right Ear: External ear normal.     Left Ear: External ear normal.     Mouth/Throat:     Pharynx: No oropharyngeal exudate.  Eyes:     Conjunctiva/sclera: Conjunctivae normal.     Pupils: Pupils are equal, round, and reactive to light.  Cardiovascular:     Rate and Rhythm: Normal rate and regular rhythm.     Heart sounds: Normal heart sounds.  Pulmonary:     Effort: Pulmonary effort is normal.     Breath sounds: Normal breath sounds.  Abdominal:     General: Bowel sounds are normal.     Palpations: Abdomen is soft.  Musculoskeletal:        General: No tenderness.     Cervical back: Normal range of motion and neck supple.     Right lower leg: No edema.     Left lower leg: No edema.  Skin:    General: Skin is warm and dry.  Neurological:     Mental Status: He is alert and oriented to person, place, and time.   Psychiatric:        Mood and Affect: Mood normal.    Labs reviewed: Recent Labs    02/08/24 0133 02/29/24 0000 05/20/24 0000 05/22/24 0001  NA 139 138 140 141  K 3.8 4.2 3.9 3.9  CL 101 100 107 107  CO2 28 31* 28* 24  GLUCOSE 88  --   --  100*  BUN 18 14 24* 20  CREATININE 1.31*  1.3 1.3 1.13  CALCIUM 9.2 9.0 8.7 8.9   Recent Labs    02/08/24 0133 05/22/24 0001  AST 21 21  ALT 13 14  ALKPHOS 120 122  BILITOT 0.8 0.6  PROT 7.3 6.7  ALBUMIN 4.1 3.8   Recent Labs    02/08/24 0133 05/20/24 0000 05/22/24 0001  WBC 6.9 4.3 5.8  NEUTROABS 4.3 2,408.00 3.2  HGB 13.2 12.3* 12.6*  HCT 40.6 38* 39.2  MCV 82.2  --  84.5  PLT 199 174 180   Lab Results  Component Value Date   TSH 1.511 08/04/2022   No results found for: HGBA1C No results found for: CHOL, HDL, LDLCALC, LDLDIRECT, TRIG, CHOLHDL  Significant Diagnostic Results in last 30 days:  No results found.  Assessment/Plan 1. Gross hematuria (Primary) -will dc catheter and monitor for retention    -to get UA C&S  -push fluids - staff to offer additional 240 cc qshift for 3 days.  Cbc with diff and bmp    Inioluwa Baris K. Caro BODILY [facility] [phone]       [1]  Allergies Allergen Reactions   Donepezil  Hcl Other (See Comments)    Reaction not specified.   "
